# Patient Record
Sex: Female | Born: 1950 | Hispanic: Yes | Marital: Married | Smoking: Never smoker
Health system: Southern US, Community
[De-identification: ages and names within clinical notes are randomized; demographics above are authoritative.]

## PROBLEM LIST (undated history)

## (undated) DIAGNOSIS — T7840XA Allergy, unspecified, initial encounter: Secondary | ICD-10-CM

## (undated) DIAGNOSIS — M1990 Unspecified osteoarthritis, unspecified site: Secondary | ICD-10-CM

## (undated) DIAGNOSIS — R001 Bradycardia, unspecified: Secondary | ICD-10-CM

## (undated) DIAGNOSIS — F419 Anxiety disorder, unspecified: Secondary | ICD-10-CM

## (undated) DIAGNOSIS — E785 Hyperlipidemia, unspecified: Secondary | ICD-10-CM

## (undated) DIAGNOSIS — M8580 Other specified disorders of bone density and structure, unspecified site: Secondary | ICD-10-CM

## (undated) DIAGNOSIS — I10 Essential (primary) hypertension: Secondary | ICD-10-CM

## (undated) DIAGNOSIS — F32A Depression, unspecified: Secondary | ICD-10-CM

## (undated) DIAGNOSIS — E119 Type 2 diabetes mellitus without complications: Secondary | ICD-10-CM

## (undated) HISTORY — DX: Hyperlipidemia, unspecified: E78.5

## (undated) HISTORY — DX: Unspecified osteoarthritis, unspecified site: M19.90

## (undated) HISTORY — DX: Allergy, unspecified, initial encounter: T78.40XA

## (undated) HISTORY — DX: Other specified disorders of bone density and structure, unspecified site: M85.80

## (undated) HISTORY — DX: Type 2 diabetes mellitus without complications: E11.9

## (undated) HISTORY — DX: Bradycardia, unspecified: R00.1

## (undated) HISTORY — PX: CATARACT EXTRACTION: SUR2

## (undated) HISTORY — PX: ABDOMINAL HYSTERECTOMY: SHX81

## (undated) HISTORY — DX: Depression, unspecified: F32.A

## (undated) HISTORY — PX: CHOLECYSTECTOMY, LAPAROSCOPIC: SHX56

## (undated) HISTORY — DX: Essential (primary) hypertension: I10

## (undated) HISTORY — DX: Anxiety disorder, unspecified: F41.9

---

## 1986-03-27 HISTORY — PX: PARTIAL HYSTERECTOMY: SHX80

## 2008-03-27 HISTORY — PX: CHOLECYSTECTOMY, LAPAROSCOPIC: SHX56

## 2009-03-27 HISTORY — PX: KNEE ARTHROSCOPY W/ MENISCAL REPAIR: SHX1877

## 2009-03-27 HISTORY — PX: KNEE SURGERY: SHX244

## 2015-03-11 LAB — HM MAMMOGRAPHY

## 2016-02-10 ENCOUNTER — Telehealth: Payer: Self-pay | Admitting: *Deleted

## 2016-02-10 NOTE — Telephone Encounter (Signed)
Unable to reach patient at time of Pre-Visit Call. Called pt via interpreter line, no message left.

## 2016-02-11 ENCOUNTER — Ambulatory Visit (INDEPENDENT_AMBULATORY_CARE_PROVIDER_SITE_OTHER): Payer: Medicare Other | Admitting: Medical

## 2016-02-11 ENCOUNTER — Encounter: Payer: Self-pay | Admitting: Medical

## 2016-02-11 VITALS — BP 110/78 | HR 77 | Temp 97.8°F | Ht 60.5 in | Wt 168.2 lb

## 2016-02-11 DIAGNOSIS — M5441 Lumbago with sciatica, right side: Secondary | ICD-10-CM

## 2016-02-11 DIAGNOSIS — Z794 Long term (current) use of insulin: Secondary | ICD-10-CM

## 2016-02-11 DIAGNOSIS — Z8639 Personal history of other endocrine, nutritional and metabolic disease: Secondary | ICD-10-CM | POA: Diagnosis not present

## 2016-02-11 DIAGNOSIS — E118 Type 2 diabetes mellitus with unspecified complications: Secondary | ICD-10-CM

## 2016-02-11 DIAGNOSIS — G4700 Insomnia, unspecified: Secondary | ICD-10-CM

## 2016-02-11 DIAGNOSIS — G8929 Other chronic pain: Secondary | ICD-10-CM

## 2016-02-11 DIAGNOSIS — E785 Hyperlipidemia, unspecified: Secondary | ICD-10-CM | POA: Diagnosis not present

## 2016-02-11 LAB — COMPREHENSIVE METABOLIC PANEL
ALBUMIN: 4.5 g/dL (ref 3.5–5.2)
ALT: 18 U/L (ref 0–35)
AST: 17 U/L (ref 0–37)
Alkaline Phosphatase: 95 U/L (ref 39–117)
BILIRUBIN TOTAL: 0.3 mg/dL (ref 0.2–1.2)
BUN: 12 mg/dL (ref 6–23)
CALCIUM: 9.9 mg/dL (ref 8.4–10.5)
CO2: 27 mEq/L (ref 19–32)
CREATININE: 0.63 mg/dL (ref 0.40–1.20)
Chloride: 104 mEq/L (ref 96–112)
GFR: 100.64 mL/min (ref >60.00)
Glucose, Bld: 104 mg/dL — ABNORMAL HIGH (ref 70–99)
Potassium: 3.8 mEq/L (ref 3.5–5.1)
Sodium: 141 mEq/L (ref 135–145)
Total Protein: 7.9 g/dL (ref 6.0–8.3)

## 2016-02-11 LAB — CBC WITH DIFFERENTIAL/PLATELET
BASOS PCT: 0.4 % (ref 0.0–3.0)
Basophils Absolute: 0 10*3/uL (ref 0.0–0.1)
EOS PCT: 3.2 % (ref 0.0–5.0)
Eosinophils Absolute: 0.3 10*3/uL (ref 0.0–0.7)
HCT: 40.5 % (ref 36.0–46.0)
Hemoglobin: 13.7 g/dL (ref 12.0–15.0)
LYMPHS ABS: 3.1 10*3/uL (ref 0.7–4.0)
Lymphocytes Relative: 30.8 % (ref 12.0–46.0)
MCHC: 33.8 g/dL (ref 30.0–36.0)
MCV: 86.4 fl (ref 78.0–100.0)
MONO ABS: 0.5 10*3/uL (ref 0.1–1.0)
MONOS PCT: 4.6 % (ref 3.0–12.0)
NEUTROS ABS: 6.2 10*3/uL (ref 1.4–7.7)
Neutrophils Relative %: 61 % (ref 43.0–77.0)
Platelets: 350 10*3/uL (ref 150.0–400.0)
RBC: 4.68 Mil/uL (ref 3.87–5.11)
RDW: 13.3 % (ref 11.5–15.5)
WBC: 10.1 10*3/uL (ref 4.0–10.5)

## 2016-02-11 LAB — LIPID PANEL
CHOL/HDL RATIO: 3
CHOLESTEROL: 155 mg/dL (ref 0–200)
HDL: 49.5 mg/dL (ref >39.00)
LDL Cholesterol: 71 mg/dL (ref 0–99)
NonHDL: 105.92
Triglycerides: 174 mg/dL — ABNORMAL HIGH (ref 0.0–149.0)
VLDL: 34.8 mg/dL (ref 0.0–40.0)

## 2016-02-11 LAB — VITAMIN D 25 HYDROXY (VIT D DEFICIENCY, FRACTURES): VITD: 30.59 ng/mL (ref 30.00–100.00)

## 2016-02-11 LAB — HEMOGLOBIN A1C: Hgb A1c MFr Bld: 6.3 % (ref 4.6–6.5)

## 2016-02-11 MED ORDER — DICLOFENAC SODIUM 75 MG PO TBEC: 75.0000 mg | DELAYED_RELEASE_TABLET | Freq: Two times a day (BID) | ORAL | 0 refills | Status: DC

## 2016-02-11 MED ORDER — TRAZODONE HCL 50 MG PO TABS: 25.0000 mg | ORAL_TABLET | Freq: Every evening | ORAL | 0 refills | Status: DC | PRN

## 2016-02-11 NOTE — Progress Notes (Signed)
Subjective:    Patient ID: Janet Peters, female    DOB: 03/26/1951, 65 y.o.   MRN: [id number]  HPI  Pt in with months of low back pain. Pain moderate-severe  pain comes and goes. She has pain that radiates down her rt leg.Pt had imaging studies in past maybe one year. Pt new from [place]. Pt states her [place] in [place] had her on nabumetone. But it never worked for her pain. No numbness of legs, no weakness No incontience reported.  Pt states has been diabetes for for 3 years has been on meformin 500 mg bid. Pt states last a1-c 6.2 in August of this year. Pt can't exercise due to her back pain  Pt also has been on lipitor for 2 years.  Pt has some insomnia per pt all her life. She did not like hydroxyzine.    Review of Systems  Constitutional: Negative for chills, fatigue and fever.  HENT: Negative for congestion and drooling.   Respiratory: Negative for cough, chest tightness, shortness of breath and wheezing.   Cardiovascular: Negative for chest pain and palpitations.  Gastrointestinal: Negative for abdominal pain, blood in stool, constipation, diarrhea, rectal pain and vomiting.  Endocrine: Negative for polydipsia and polyphagia.  Genitourinary: Negative for dysuria, flank pain, frequency, genital sores and urgency.  Musculoskeletal: Positive for back pain. Negative for myalgias and neck pain.  Skin: Negative for rash.  Neurological: Negative for dizziness, syncope, speech difficulty, weakness and headaches.       Some radicular pain.  Psychiatric/Behavioral: Positive for sleep disturbance. Negative for behavioral problems, confusion and suicidal ideas.    Past Medical History:  Diagnosis Date  . Diabetes mellitus without complication ([place])   . Hyperlipidemia      Social History   Social History  . Marital status: Married    Spouse name: N/A  . Number of children: N/A  . Years of education: N/A   Occupational History  . Not on file.   Social History Main  Topics  . Smoking status: Never Smoker  . Smokeless tobacco: Never Used  . Alcohol use No  . Drug use: No  . Sexual activity: Yes   Other Topics Concern  . Not on file   Social History Narrative  . No narrative on file    Past Surgical History:  Procedure Laterality Date  . ABDOMINAL HYSTERECTOMY    . CESAREAN SECTION    . KNEE SURGERY Left 2011    History reviewed. No pertinent family history.  No Known Allergies  No current outpatient prescriptions on file prior to visit.   No current facility-administered medications on file prior to visit.     BP 110/78 (BP Location: Left Arm, Patient Position: Sitting)   Pulse 77   Temp 97.8 F (36.6 C) (Oral)   Ht 5' 0.5" (1.537 m) Comment: w/o shoes  Wt 168 lb 3.2 oz (76.3 kg)   BMI 32.31 kg/m       Objective:   Physical Exam  General Appearance- Not in acute distress.    Chest and Lung Exam Auscultation: Breath sounds:-Normal. Clear even and unlabored. Adventitious sounds:- No Adventitious sounds.  Cardiovascular Auscultation:Rythm - Regular, rate and rythm. Heart Sounds -Normal heart sounds.  Abdomen Inspection:-Inspection Normal.  Palpation/Perucssion: Palpation and Percussion of the abdomen reveal- Non Tender, No Rebound tenderness, No rigidity(Guarding) and No Palpable abdominal masses.  Liver:-Normal.  Spleen:- Normal.   Back Mid lumbar spine tenderness to palpation. Pain on straight leg lift. Pain  on lateral movements and flexion/extension of the spine.  Lower ext neurologic  L5-S1 sensation intact bilaterally. Normal patellar reflexes bilaterally. No foot drop bilaterally.  Feet- see quality metrics.        Assessment & Plan:  For your visit today will get cbc, cmp, lipid panel and a1-c.  Continue metformin and atorvastatin. May adjust treatment when labs are back.  Also get vitamin d level.  Stop nabumetone and rx diclofenac for pain.  For insomnia stop hydroxyzine and start  trazadone.  Follow up in 3-4 weeks. Please sign release form so we can get old records.

## 2016-02-11 NOTE — Progress Notes (Signed)
Pre visit review using our clinic review tool, if applicable. No additional management support is needed unless otherwise documented below in the visit note. 

## 2016-02-11 NOTE — Patient Instructions (Addendum)
For your visit today will get cbc, cmp, lipid panel and a1-c.  Continue metformin and atorvastatin. May adjust treatment when labs are back.  Also get vitamin d level.  Stop nabumetone and rx diclofenac for pain.  For insomnia stop hydroxyzine and start trazadone.  Follow up in 3-4 weeks. Please sign release form so we can get old records.

## 2016-02-13 ENCOUNTER — Telehealth: Payer: Self-pay | Admitting: Medical

## 2016-02-13 MED ORDER — VITAMIN D (ERGOCALCIFEROL) 1.25 MG (50000 UNIT) PO CAPS: 50000.0000 [IU] | ORAL_CAPSULE | ORAL | 0 refills | Status: DC

## 2016-02-13 NOTE — Telephone Encounter (Signed)
Sent vitamin D to pt pharmacy.

## 2016-02-29 ENCOUNTER — Telehealth: Payer: Self-pay | Admitting: Medical

## 2016-02-29 ENCOUNTER — Encounter: Payer: Self-pay | Admitting: Medical

## 2016-02-29 DIAGNOSIS — Z1231 Encounter for screening mammogram for malignant neoplasm of breast: Secondary | ICD-10-CM

## 2016-02-29 NOTE — Telephone Encounter (Signed)
Pt had mammogram ordered 2016. I don't see the report. I just got records of old mammograms. Done at [facility] [phone]. I  Have copy of mammogram reports. Maybe they never did study in 2016 since they did not have old report or films. Sometimes in past radiology would not do mammogram without prior study to compare? So will you get her scheduled now for mammogram. Coordinate referral with Jackelyn. I think she is spanish speaker.

## 2016-02-29 NOTE — Progress Notes (Signed)
[place]: No radiographically suspicious lesions.The patients next mammogram should be performed in 1 year, unless future clinical circumstances dictate otherwise.  [facility]

## 2016-03-09 ENCOUNTER — Ambulatory Visit (HOSPITAL_BASED_OUTPATIENT_CLINIC_OR_DEPARTMENT_OTHER)
Admission: RE | Admit: 2016-03-09 | Discharge: 2016-03-09 | Disposition: A | Discharge status: Home / Self Care | Payer: Medicare Other | Source: Ambulatory Visit | Attending: Medical | Admitting: Medical

## 2016-03-09 ENCOUNTER — Telehealth: Payer: Self-pay | Admitting: Medical

## 2016-03-09 ENCOUNTER — Telehealth: Payer: Self-pay | Admitting: Internal Medicine

## 2016-03-09 ENCOUNTER — Ambulatory Visit (INDEPENDENT_AMBULATORY_CARE_PROVIDER_SITE_OTHER): Payer: Medicare Other | Admitting: Medical

## 2016-03-09 ENCOUNTER — Encounter: Payer: Self-pay | Admitting: Medical

## 2016-03-09 VITALS — BP 110/68 | HR 110 | Temp 98.1°F | Ht 60.5 in | Wt 170.0 lb

## 2016-03-09 DIAGNOSIS — M5441 Lumbago with sciatica, right side: Secondary | ICD-10-CM | POA: Diagnosis not present

## 2016-03-09 DIAGNOSIS — R1011 Right upper quadrant pain: Secondary | ICD-10-CM

## 2016-03-09 DIAGNOSIS — E118 Type 2 diabetes mellitus with unspecified complications: Secondary | ICD-10-CM

## 2016-03-09 DIAGNOSIS — G8929 Other chronic pain: Secondary | ICD-10-CM

## 2016-03-09 DIAGNOSIS — Z1231 Encounter for screening mammogram for malignant neoplasm of breast: Secondary | ICD-10-CM | POA: Diagnosis present

## 2016-03-09 DIAGNOSIS — G4700 Insomnia, unspecified: Secondary | ICD-10-CM | POA: Diagnosis not present

## 2016-03-09 DIAGNOSIS — Z8639 Personal history of other endocrine, nutritional and metabolic disease: Secondary | ICD-10-CM

## 2016-03-09 DIAGNOSIS — Z794 Long term (current) use of insulin: Secondary | ICD-10-CM

## 2016-03-09 DIAGNOSIS — Z1211 Encounter for screening for malignant neoplasm of colon: Secondary | ICD-10-CM

## 2016-03-09 MED ORDER — ZOLPIDEM TARTRATE 5 MG PO TABS: 5.0000 mg | ORAL_TABLET | Freq: Every evening | ORAL | 1 refills | Status: DC | PRN

## 2016-03-09 MED ORDER — METFORMIN HCL 500 MG PO TABS: 500.0000 mg | ORAL_TABLET | Freq: Every day | ORAL | 1 refills | Status: DC

## 2016-03-09 NOTE — Telephone Encounter (Signed)
Would you help in trying to get her old records. I have not see her most recent mammogram or colonoscopy report. Will you coordinate and call those offices

## 2016-03-09 NOTE — Progress Notes (Signed)
Subjective:    Patient ID: Docie Hubley, female    DOB: 1951/02/04, 65 y.o.   MRN: [id number]  HPI  Pt in state on vitamin d 5000 units otc q day. Her vitamin d was normal but on lower end.   Pt had some lower back pain but with diclofenac for 10 days her back pain and associated symptoms stopped. Pt brings old mri report that states slight l4-l5 small herniated  Pt a1-c was 6.3. She needs refill of her metformin.  Pt lipid panel looked good only mild triglycerides elevated. Will continue atorvastatin 10 mg q day. Advised low cholesterol diet and exercise.   Pt states trazadone did not help her sleep. Neither did hydroxyzine. Pt states at most sleeps 3-4 usually at night for years. Pt has concern for meds that could be habit forming.(Pt is ok with trying very limited number of ambien per month)  End of exam noted years of very low and transient pain rt upper quadrant after eating but no pain presently. She is concerned for fatty liver and indicates she wants a ultrasound.     Review of Systems  Constitutional: Negative for chills and fatigue.  HENT: Negative for congestion, ear discharge and facial swelling.   Respiratory: Negative for cough, chest tightness, shortness of breath and wheezing.   Cardiovascular: Negative for chest pain and palpitations.  Gastrointestinal: Negative for abdominal pain and constipation.  Musculoskeletal: Negative for back pain and myalgias.  Skin: Negative for rash.  Neurological: Negative for dizziness, seizures, weakness and light-headedness.  Hematological: Negative for adenopathy. Does not bruise/bleed easily.  Psychiatric/Behavioral: Negative for agitation and decreased concentration.    Past Medical History:  Diagnosis Date  . Diabetes mellitus without complication ([place])   . Hyperlipidemia      Social History   Social History  . Marital status: Married    Spouse name: N/A  . Number of children: N/A  . Years of education: N/A    Occupational History  . Not on file.   Social History Main Topics  . Smoking status: Never Smoker  . Smokeless tobacco: Never Used  . Alcohol use No  . Drug use: No  . Sexual activity: Yes   Other Topics Concern  . Not on file   Social History Narrative  . No narrative on file    Past Surgical History:  Procedure Laterality Date  . ABDOMINAL HYSTERECTOMY    . CESAREAN SECTION    . KNEE SURGERY Left 2011    No family history on file.  No Known Allergies  Current Outpatient Prescriptions on File Prior to Visit  Medication Sig Dispense Refill  . CINNAMON PO Take by mouth.    . diclofenac (VOLTAREN) 75 MG EC tablet Take 1 tablet (75 mg total) by mouth 2 (two) times daily. 60 tablet 0  . metFORMIN (GLUCOPHAGE) 500 MG tablet Take by mouth daily with breakfast. Patient takes 1.5 tablets a day.    . traZODone (DESYREL) 50 MG tablet Take 0.5-1 tablets (25-50 mg total) by mouth at bedtime as needed for sleep. 30 tablet 0  . Vitamin D, Ergocalciferol, (DRISDOL) 50000 units CAPS capsule Take 1 capsule (50,000 Units total) by mouth every 7 (seven) days. 4 capsule 0   No current facility-administered medications on file prior to visit.     BP 110/68 (BP Location: Left Arm, Patient Position: Sitting, Cuff Size: Normal)   Pulse (!) 110   Temp 98.1 F (36.7 C) (Oral)   Ht 5'  0.5" (1.537 m)   Wt 170 lb (77.1 kg)   SpO2 97%   BMI 32.65 kg/m       Objective:   Physical Exam  General Mental Status- Alert. General Appearance- Not in acute distress.     Chest and Lung Exam Auscultation: Breath Sounds:-Normal.  Cardiovascular Auscultation:Rythm- Regular. Murmurs & Other Heart Sounds:Auscultation of the heart reveals- No Murmurs.  Abdomen Inspection:-Inspeection Normal. Palpation/Percussion:Note:No mass. Palpation and Percussion of the abdomen reveal- Non Tender, Non Distended + BS, no rebound or guarding.    Neurologic Cranial Nerve exam:- CN III-XII intact(No  nystagmus), symmetric smile. Strength:- 5/5 equal and symmetric strength both upper and lower extremities.      Assessment & Plan:  For history of  low vitamin D continue daily otc vit d. Will recheck level in about 6 month.  For diabetes, I want you to continue your metformin and will rx refill today.  For insomnia will rx Lorrin Mais but very limited amount. Only 10 per month. Can use every 3rd or 4th day to get good night sleep. Will see if this helps. If so will get do uds on follow up and get you to sign a contract.  For low back that is resolved can use diclofenac as needed.   Will order abdomen US to see if have fatty liver or Gallbladder disease.  Follow up in 1-2 month.

## 2016-03-09 NOTE — Telephone Encounter (Signed)
received Colon report and placed on Dr. Vena Rua desk for review. Dr. Hilarie Fredrickson is Doc of the Day.

## 2016-03-09 NOTE — Progress Notes (Signed)
Pre visit review using our clinic review tool, if applicable. No additional management support is needed unless otherwise documented below in the visit note. 

## 2016-03-09 NOTE — Patient Instructions (Addendum)
For history of  low vitamin D continue daily otc vit d. Will recheck level in about 6 month.  For diabetes, I want you to continue your metformin and will rx refill today.  For insomnia will rx Lorrin Mais but very limited amount. Only 10 per month. Can use every 3rd or 4th day to get good night sleep. Will see if this helps. If so will get do uds on follow up and get you to sign a contract.  For low back that is resolved can use diclofenac as needed.   Will order abdomen US to see if have fatty liver.   Follow up in 1-2 month or as needed

## 2016-03-10 ENCOUNTER — Ambulatory Visit (HOSPITAL_BASED_OUTPATIENT_CLINIC_OR_DEPARTMENT_OTHER)
Admission: RE | Admit: 2016-03-10 | Discharge: 2016-03-10 | Disposition: A | Discharge status: Home / Self Care | Payer: Medicare Other | Source: Ambulatory Visit | Attending: Medical | Admitting: Medical

## 2016-03-10 DIAGNOSIS — Z9049 Acquired absence of other specified parts of digestive tract: Secondary | ICD-10-CM | POA: Insufficient documentation

## 2016-03-10 DIAGNOSIS — R1011 Right upper quadrant pain: Secondary | ICD-10-CM | POA: Diagnosis present

## 2016-03-14 NOTE — Progress Notes (Signed)
Left VM for pt to call back to give results.

## 2016-03-21 DIAGNOSIS — E119 Type 2 diabetes mellitus without complications: Secondary | ICD-10-CM | POA: Insufficient documentation

## 2016-03-21 DIAGNOSIS — E782 Mixed hyperlipidemia: Secondary | ICD-10-CM | POA: Insufficient documentation

## 2016-03-30 NOTE — Telephone Encounter (Signed)
Spoke with patient states she does not have any family Hx of  colon cancer and is okay with recall colon being due 10/2019 per Dr.Pyrtle's recommendation.

## 2016-04-13 ENCOUNTER — Ambulatory Visit: Payer: Medicare Other | Admitting: Medical

## 2016-04-18 ENCOUNTER — Encounter: Payer: Self-pay | Admitting: Medical

## 2016-04-18 ENCOUNTER — Ambulatory Visit (INDEPENDENT_AMBULATORY_CARE_PROVIDER_SITE_OTHER): Payer: Medicare Other | Admitting: Medical

## 2016-04-18 VITALS — BP 135/70 | HR 102 | Temp 98.2°F | Resp 16 | Ht 60.5 in | Wt 169.0 lb

## 2016-04-18 DIAGNOSIS — M25511 Pain in right shoulder: Secondary | ICD-10-CM

## 2016-04-18 DIAGNOSIS — Z8639 Personal history of other endocrine, nutritional and metabolic disease: Secondary | ICD-10-CM | POA: Diagnosis not present

## 2016-04-18 DIAGNOSIS — Z794 Long term (current) use of insulin: Secondary | ICD-10-CM

## 2016-04-18 DIAGNOSIS — G4700 Insomnia, unspecified: Secondary | ICD-10-CM

## 2016-04-18 DIAGNOSIS — E118 Type 2 diabetes mellitus with unspecified complications: Secondary | ICD-10-CM | POA: Diagnosis not present

## 2016-04-18 MED ORDER — MIRTAZAPINE 15 MG PO TABS: 15.0000 mg | ORAL_TABLET | Freq: Every day | ORAL | 0 refills | Status: DC

## 2016-04-18 MED ORDER — VITAMIN D (ERGOCALCIFEROL) 1.25 MG (50000 UNIT) PO CAPS: 50000.0000 [IU] | ORAL_CAPSULE | ORAL | Status: DC

## 2016-04-18 NOTE — Progress Notes (Signed)
Pre visit review using our clinic review tool, if applicable. No additional management support is needed unless otherwise documented below in the visit note/SLS  

## 2016-04-18 NOTE — Patient Instructions (Addendum)
For low vitamin D will rx vitamin D 50,000 units. Stop otc vit d. Repeat vit d level in  months.  Continue metformin  And recheck a1c in 3 months.  For your insomnia will try remeron.   For your shoulder pain which appears resolved follow sport med advise and treatement.  For you varying bp levels check bp daily at home twice a day. Schedule nurse visit bp check in 2 weeks. Bring your machine day of bp check so we can verify good reading.   Follow up for regular check up in 3 months.

## 2016-04-18 NOTE — Progress Notes (Signed)
Subjective:    Patient ID: Janet Peters, female    DOB: 02-21-1951, 66 y.o.   MRN: [id number]  HPI   Pt in for follow up. Pt had no  fat on her liver which was her concern so I ordered US abdomen. Pt pain is only faint and mild at time. Really states no pain just discomfort. No epigastric pain. She thinks maybe associated with eating and not having gallbladder. She declines further work up presently.  Pt in states ambien did not help her sleep. Pt in past trazadone and use hydroxyzine and did not help. Husband denies any snoring in wife. Pt never used remeron past. Pt states she sleeps only 3 hours. She states even when younger was 66 years old slept like this.  Pt bp is good today. No ha an no cardiac signs or symptoms. Pt does have some variation during the day. Rare [id number] systolic but mostly controlled. She thinks stress increases by at times. She is worried about family living in [place].  Pt did go to sports medicine. Treated for bursstis of shoulder. Given steorid injection and meloxicam. Pt states pain is improved but she has concern that can reoccur.   Pt has borderline low vit d. on 5,000 units. Borderline despite daily use of this before recent level drawn.      Review of Systems  Constitutional: Negative for chills, fatigue and fever.  HENT: Negative for congestion and dental problem.   Respiratory: Negative for cough, chest tightness, shortness of breath and wheezing.   Cardiovascular: Negative for chest pain and palpitations.  Gastrointestinal: Negative for abdominal distention, abdominal pain, blood in stool, diarrhea, nausea and vomiting.       Faint ruq quadrant/rt lower rib discomfort mild.  Musculoskeletal:       Shoulder better.  Skin: Negative for rash.  Neurological: Negative for dizziness, speech difficulty, weakness, light-headedness, numbness and headaches.  Hematological: Negative for adenopathy. Does not bruise/bleed easily.    Psychiatric/Behavioral: Positive for sleep disturbance. Negative for behavioral problems, decreased concentration and suicidal ideas. The patient is not nervous/anxious.     Past Medical History:  Diagnosis Date  . Diabetes mellitus without complication ([place])   . Hyperlipidemia      Social History   Social History  . Marital status: Married    Spouse name: N/A  . Number of children: N/A  . Years of education: N/A   Occupational History  . Not on file.   Social History Main Topics  . Smoking status: Never Smoker  . Smokeless tobacco: Never Used  . Alcohol use No  . Drug use: No  . Sexual activity: Yes   Other Topics Concern  . Not on file   Social History Narrative  . No narrative on file    Past Surgical History:  Procedure Laterality Date  . ABDOMINAL HYSTERECTOMY    . CESAREAN SECTION    . KNEE SURGERY Left 2011    No family history on file.  No Known Allergies  Current Outpatient Prescriptions on File Prior to Visit  Medication Sig Dispense Refill  . CINNAMON PO Take by mouth.    . diclofenac (VOLTAREN) 75 MG EC tablet Take 1 tablet (75 mg total) by mouth 2 (two) times daily. 60 tablet 0  . metFORMIN (GLUCOPHAGE) 500 MG tablet Take 1 tablet (500 mg total) by mouth daily with breakfast. Patient takes 1.5 tablets a day. 135 tablet 1   No current facility-administered medications on file prior to visit.  BP 135/70 (BP Location: Left Arm, Patient Position: Sitting, Cuff Size: Large)   Pulse (!) 102   Temp 98.2 F (36.8 C) (Oral)   Resp 16   Ht 5' 0.5" (1.537 m)   Wt 169 lb (76.7 kg)   SpO2 100%   BMI 32.46 kg/m       Objective:   Physical Exam   General Mental Status- Alert. General Appearance- Not in acute distress.   Skin General: Color- Normal Color. Moisture- Normal Moisture.  Neck Carotid Arteries- Normal color. Moisture- Normal Moisture. No carotid bruits. No JVD.  Chest and Lung Exam Auscultation: Breath  Sounds:-Normal.  Cardiovascular Auscultation:Rythm- Regular. Murmurs & Other Heart Sounds:Auscultation of the heart reveals- No Murmurs.  Abdomen Inspection:-Inspeection Normal. Palpation/Percussion:Note:No mass. Palpation and Percussion of the abdomen reveal- Non Tender, Non Distended + BS, no rebound or guarding.   Neurologic Cranial Nerve exam:- CN III-XII intact(No nystagmus), symmetric smile. Strength:- 5/5 equal and symmetric strength both upper and lower extremities.     Assessment & Plan:  For low vitamin D will rx vitamin D 50,000 units. Stop otc vit d. Repeat vit d level in  months.  Continue metformin  And recheck a1c in 3 months.  For your insomnia will try remeron.   For your shoulder pain which appears resolved follow sport med advise and treatement.  For you varying bp levels check bp daily at home twice a day. Schedule nurse visit bp check in 2 weeks. Bring your machine day of bp check so we can verify good reading.   Follow up for regular check up in 3 months  Keishana Klinger, Percell Miller, [place]

## 2016-05-09 ENCOUNTER — Ambulatory Visit (INDEPENDENT_AMBULATORY_CARE_PROVIDER_SITE_OTHER): Payer: Medicare Other | Admitting: Medical

## 2016-05-09 ENCOUNTER — Telehealth: Payer: Self-pay | Admitting: Medical

## 2016-05-09 DIAGNOSIS — R030 Elevated blood-pressure reading, without diagnosis of hypertension: Secondary | ICD-10-CM | POA: Diagnosis not present

## 2016-05-09 MED ORDER — ATORVASTATIN CALCIUM 10 MG PO TABS: 10.0000 mg | ORAL_TABLET | Freq: Every day | ORAL | 3 refills | Status: DC

## 2016-05-09 NOTE — Telephone Encounter (Signed)
Caller name: Darryl Relation to pt: self  Call back number: [phone] Pharmacy: [facility], [address]  Reason for call: Pt came in office requesting needing prescription of Atorvastatin 10 mg, once a day. Pt states her last primary doctor gave her prescription the last time but now that she has changed primary care to our office pt only has 5 days of meds left. Please advise.

## 2016-05-09 NOTE — Telephone Encounter (Signed)
Pt has some hyperlipidemia. Liver enzymes not elevated. I wrote rx for low dose atorvastatin. Advise her will repeat her lipid panel and metabolic panel in 3 months fasting.

## 2016-05-09 NOTE — Telephone Encounter (Signed)
Janet Peters-- please advise below request. Medication was not previously reported but is showing in med reconciliation. Please advise?

## 2016-05-09 NOTE — Telephone Encounter (Signed)
Pt is primarily a spanish speaker. So send not to [place] to call pt. Thanks for your help.

## 2016-05-09 NOTE — Progress Notes (Signed)
Pre visit review using our clinic tool,if applicable. No additional management support is needed unless otherwise documented below in the visit note.    Patient in for BP check per verbal order given by Elise Benne 05/09/16.  BP today =138/82 P=118  Per E. Saguier,PA-C patient may have white coat syndrome when first coming in to office. Medication logs from home were within normal limits. No need to start medications for BP  this time.  I talked with RN and not bp medications given. Agree with above plan and assessement. I talked with pt by phone regarding her pulse. When she checks her bp her pulse is never over 100. She checks bp twice daily. She will continue to check pulse. If over 100 she will call Jackely/spanish speaking staff and notify her. Then message will be passed to me.  Saguier, Percell Miller, PA-C

## 2016-05-10 NOTE — Telephone Encounter (Signed)
Pt was informed the info below, and pt understood pt will pick up rx and already has an appt ready in July 18, 2016 with provider and will have labs done the same day.

## 2016-05-10 NOTE — Telephone Encounter (Signed)
LVM for pt to return call

## 2016-07-04 ENCOUNTER — Ambulatory Visit (INDEPENDENT_AMBULATORY_CARE_PROVIDER_SITE_OTHER): Payer: Medicare Other | Admitting: Medical

## 2016-07-04 VITALS — BP 130/70 | HR 98 | Wt 167.6 lb

## 2016-07-04 DIAGNOSIS — I4510 Unspecified right bundle-branch block: Secondary | ICD-10-CM

## 2016-07-04 DIAGNOSIS — E785 Hyperlipidemia, unspecified: Secondary | ICD-10-CM | POA: Diagnosis not present

## 2016-07-04 DIAGNOSIS — E118 Type 2 diabetes mellitus with unspecified complications: Secondary | ICD-10-CM

## 2016-07-04 DIAGNOSIS — F419 Anxiety disorder, unspecified: Secondary | ICD-10-CM

## 2016-07-04 DIAGNOSIS — Z794 Long term (current) use of insulin: Secondary | ICD-10-CM

## 2016-07-04 DIAGNOSIS — H9201 Otalgia, right ear: Secondary | ICD-10-CM

## 2016-07-04 DIAGNOSIS — E559 Vitamin D deficiency, unspecified: Secondary | ICD-10-CM | POA: Diagnosis not present

## 2016-07-04 DIAGNOSIS — R000 Tachycardia, unspecified: Secondary | ICD-10-CM

## 2016-07-04 DIAGNOSIS — R7989 Other specified abnormal findings of blood chemistry: Secondary | ICD-10-CM

## 2016-07-04 LAB — LIPID PANEL
CHOLESTEROL: 146 mg/dL (ref 0–200)
HDL: 46.4 mg/dL (ref >39.00)
LDL Cholesterol: 70 mg/dL (ref 0–99)
NonHDL: 99.14
TRIGLYCERIDES: 145 mg/dL (ref 0.0–149.0)
Total CHOL/HDL Ratio: 3
VLDL: 29 mg/dL (ref 0.0–40.0)

## 2016-07-04 LAB — COMPREHENSIVE METABOLIC PANEL
ALBUMIN: 4.5 g/dL (ref 3.5–5.2)
ALK PHOS: 96 U/L (ref 39–117)
ALT: 18 U/L (ref 0–35)
AST: 17 U/L (ref 0–37)
BILIRUBIN TOTAL: 0.4 mg/dL (ref 0.2–1.2)
BUN: 16 mg/dL (ref 6–23)
CALCIUM: 9.9 mg/dL (ref 8.4–10.5)
CO2: 27 mEq/L (ref 19–32)
CREATININE: 0.66 mg/dL (ref 0.40–1.20)
Chloride: 105 mEq/L (ref 96–112)
GFR: 95.26 mL/min (ref >60.00)
Glucose, Bld: 122 mg/dL — ABNORMAL HIGH (ref 70–99)
Potassium: 4.1 mEq/L (ref 3.5–5.1)
Sodium: 140 mEq/L (ref 135–145)
TOTAL PROTEIN: 8.1 g/dL (ref 6.0–8.3)

## 2016-07-04 LAB — HEMOGLOBIN A1C: Hgb A1c MFr Bld: 6.3 % (ref 4.6–6.5)

## 2016-07-04 LAB — TROPONIN I: TNIDX: 0 ug/L (ref 0.00–0.06)

## 2016-07-04 MED ORDER — CLONAZEPAM 0.5 MG PO TABS: 0.5000 mg | ORAL_TABLET | Freq: Two times a day (BID) | ORAL | 0 refills | Status: DC | PRN

## 2016-07-04 NOTE — Progress Notes (Signed)
Subjective:    Patient ID: Janet Peters, female    DOB: 22-Apr-1950, 66 y.o.   MRN: [id number]  HPI  Pt in for follow up.   Pt in with some left ear discomfort. Feels like water in ear. No uri or allergy signs or symptoms recently. Has faint ear discomfort/water sensation on and off for 2 weeks.  Pt also states at times she feels some anxiety at times. Happens at various times. No note of any rapid heart rate. Pt continue to worry about situation with her home country [place].(as trip approaches she feels more anxious). She in past described concern about her family that still lives there.   Pt had rare on event of high pulse on bp check her. At that time felt no symptoms  She has no recurrence. She is currently asymptomatic at this time and she wants ekg. No prior ekg done in our office. Last one she states was done in [place] years ago. No abnormality noted.   Pt pulse when she check is 85-90. When she checked her bp 120/70.  She checks bp and pulse every 3 days. Pt thinks med she took for her shoulder caused her bp to increase. She notes when had used meloxicam her bp went up. No longer using.     Review of Systems  Constitutional: Negative for chills, fatigue and fever.  HENT: Negative for congestion, ear discharge, ear pain, facial swelling, postnasal drip, rhinorrhea, sinus pain, sinus pressure and sneezing.   Respiratory: Negative for cough, chest tightness, shortness of breath and wheezing.   Cardiovascular: Negative for chest pain and palpitations.  Gastrointestinal: Negative for abdominal pain, diarrhea and nausea.  Musculoskeletal: Negative for back pain and neck pain.  Skin: Negative for rash.  Neurological: Negative for dizziness, weakness, light-headedness and headaches.  Hematological: Negative for adenopathy. Does not bruise/bleed easily.  Psychiatric/Behavioral: Negative for agitation, behavioral problems, confusion, self-injury, sleep disturbance and  suicidal ideas. The patient is nervous/anxious.     Past Medical History:  Diagnosis Date  . Diabetes mellitus without complication ([place])   . Hyperlipidemia      Social History   Social History  . Marital status: Married    Spouse name: N/A  . Number of children: N/A  . Years of education: N/A   Occupational History  . Not on file.   Social History Main Topics  . Smoking status: Never Smoker  . Smokeless tobacco: Never Used  . Alcohol use No  . Drug use: No  . Sexual activity: Yes   Other Topics Concern  . Not on file   Social History Narrative  . No narrative on file    Past Surgical History:  Procedure Laterality Date  . ABDOMINAL HYSTERECTOMY    . CESAREAN SECTION    . KNEE SURGERY Left 2011    No family history on file.  No Known Allergies  Current Outpatient Prescriptions on File Prior to Visit  Medication Sig Dispense Refill  . atorvastatin (LIPITOR) 10 MG tablet Take 1 tablet (10 mg total) by mouth daily. 30 tablet 3  . Cholecalciferol (VITAMIN D3) 5000 units CAPS Take by mouth.    Marland Kitchen CINNAMON PO Take by mouth.    . diclofenac (VOLTAREN) 75 MG EC tablet Take 1 tablet (75 mg total) by mouth 2 (two) times daily. 60 tablet 0  . meloxicam (MOBIC) 15 MG tablet Take 15 mg by mouth daily.    . metFORMIN (GLUCOPHAGE) 500 MG tablet Take 1 tablet (  500 mg total) by mouth daily with breakfast. Patient takes 1.5 tablets a day. 135 tablet 1  . mirtazapine (REMERON) 15 MG tablet Take 1 tablet (15 mg total) by mouth at bedtime. 30 tablet 0  . Vitamin D, Ergocalciferol, (DRISDOL) 50000 units CAPS capsule Take 1 capsule (50,000 Units total) by mouth every 7 (seven) days. 8 capsule 0-   No current facility-administered medications on file prior to visit.     BP 130/70   Pulse 98   Wt 167 lb 9.6 oz (76 kg)   SpO2 97%   BMI 32.19 kg/m       Objective:   Physical Exam  General  Mental Status - Alert. General Appearance - Well groomed. Not in acute  distress.  Skin Rashes- No Rashes.  HEENT Head- Normal. Ear Auditory Canal - Left- Normal. Right - Normal.Tympanic Membrane- Left- Normal. Right- Normal. Eye Sclera/Conjunctiva- Left- Normal. Right- Normal. Nose & Sinuses Nasal Mucosa- Left-  Boggy and Congested. Right-  Boggy and  Congested.Bilateral  No maxillary and no  frontal sinus pressure. Mouth & Throat Lips: Upper Lip- Normal: no dryness, cracking, pallor, cyanosis, or vesicular eruption. Lower Lip-Normal: no dryness, cracking, pallor, cyanosis or vesicular eruption. Buccal Mucosa- Bilateral- No Aphthous ulcers. Oropharynx- No Discharge or Erythema. Tonsils: Characteristics- Bilateral- No Erythema or Congestion. Size/Enlargement- Bilateral- No enlargement. Discharge- bilateral-None.  Neck Neck- Supple. No Masses.   Chest and Lung Exam Auscultation: Breath Sounds:-Clear even and unlabored.  Cardiovascular Auscultation:Rythm- Regular, rate and rhythm. Murmurs & Other Heart Sounds:Ausculatation of the heart reveal- No Murmurs.  Lymphatic Head & Neck General Head & Neck Lymphatics: Bilateral: Description- No Localized lymphadenopathy.  Lower ext- no pedal edema. Negative homans sign.        Assessment & Plan:  For your anxiety and upcoming trip will make low klonopin available.(she was instructed on how to use. She will use sparingly)  For rt ear discomfort I think this may be ear pressure related to low level allergic rhinitis. Rx flonase.  For hx of mild tachycardia but rt bundle branch on ekg will refer you to cardiologist to work up potential causes and evaluate further.  Will get labs today lipid panel, a1c and cmp.  Call jackelyn by this Friday if no call from cardiologist office.  Follow up in 10 days or as needed  Total of 40 minutes spent with pt. 50% of time spent counseling pt on various issues. Most of counseling regarding her ekg today and my desire to refer to cardiologist. But also did counsel  on anxiety and her potential cause of ear discomfort.  Robbye Dede, Percell Miller, PA-C

## 2016-07-04 NOTE — Patient Instructions (Addendum)
For your anxiety and upcoming trip will make low klonopin available. (she was instructed on how to use. She will use sparingly)  For rt ear discomfort I think this may be ear pressure related to low level allergic rhinitis. Rx flonase.  For hx of mild tachycardia but rt bundle branch on ekg will refer you to cardiologist to work up potential causes and evaluate further.  Will get labs today lipid panel, a1c and cmp.  Call jackelyn by this Friday if no call from cardiologist office.  Follow up in 10 days or as needed

## 2016-07-05 ENCOUNTER — Telehealth: Payer: Self-pay | Admitting: Medical

## 2016-07-05 NOTE — Telephone Encounter (Signed)
Looks like she was notified of lab results by [place].

## 2016-07-05 NOTE — Telephone Encounter (Signed)
Relation to YE:MVVK Call back [phone]   Reason for call:  Patient inquiring about lab result

## 2016-07-05 NOTE — Telephone Encounter (Signed)
Pt was informed the below and understood. °

## 2016-07-05 NOTE — Telephone Encounter (Signed)
-----   Message from Mackie Pai, PA-C sent at 07/04/2016  3:49 PM EDT ----- Pt sugar level is about the exact same.(a1-c 6.3 last time and again 6.3) Kidney function still looks good. Lipid panel now looks all good. Triglycerides/fat in blood now in normal limits. No changes to current meds/treatment.

## 2016-07-08 LAB — VITAMIN D 1,25 DIHYDROXY
Vitamin D 1, 25 (OH)2 Total: 34 pg/mL (ref 18–72)
Vitamin D2 1, 25 (OH)2: 13 pg/mL
Vitamin D3 1, 25 (OH)2: 21 pg/mL

## 2016-07-10 NOTE — Telephone Encounter (Signed)
Pt was also informed about her results for Vitamin D as mentioned on staff message.

## 2016-07-17 ENCOUNTER — Encounter: Payer: Self-pay | Admitting: Cardiology

## 2016-07-17 ENCOUNTER — Ambulatory Visit (INDEPENDENT_AMBULATORY_CARE_PROVIDER_SITE_OTHER): Payer: Medicare Other | Admitting: Cardiology

## 2016-07-17 ENCOUNTER — Ambulatory Visit: Payer: Medicare Other | Admitting: Cardiology

## 2016-07-17 VITALS — BP 126/80 | HR 105 | Ht 62.0 in | Wt 170.2 lb

## 2016-07-17 DIAGNOSIS — I4510 Unspecified right bundle-branch block: Secondary | ICD-10-CM | POA: Diagnosis not present

## 2016-07-17 NOTE — Progress Notes (Signed)
Electrophysiology Office Note   Date:  07/17/2016   ID:  Minda Faas, Alferd Apa [birth year], MRN [id number]  PCP:  Elise Benne Primary Electrophysiologist:  Starlene Consuegra Meredith Leeds, MD    Chief Complaint  Patient presents with  . New Patient (Initial Visit)    RBBB     History of Present Illness: Janet Peters is a 66 y.o. female who is being seen today for the evaluation of RBBB at the request of Saguier, Percell Miller, [place]. Presenting today for electrophysiology evaluation. Has a history of both diabetes and hypertension. He noted that her pulse was high on her blood pressure checked. She had no symptoms at that time. She has not had a recurrence. Her EKG showed a right bundle branch block. There is no EKG to compare.    Today, she denies symptoms of palpitations, chest pain, shortness of breath, orthopnea, PND, lower extremity edema, claudication, dizziness, presyncope, syncope, bleeding, or neurologic sequela. The patient is tolerating medications without difficulties.    Past Medical History:  Diagnosis Date  . Diabetes mellitus without complication ([place])   . Hyperlipidemia    Past Surgical History:  Procedure Laterality Date  . ABDOMINAL HYSTERECTOMY    . CESAREAN SECTION    . KNEE SURGERY Left 2011     Current Outpatient Prescriptions  Medication Sig Dispense Refill  . atorvastatin (LIPITOR) 10 MG tablet Take 1 tablet (10 mg total) by mouth daily. 30 tablet 3  . Cholecalciferol (VITAMIN D3) 5000 units CAPS Take by mouth.    Marland Kitchen CINNAMON PO Take by mouth.    . Magnesium 400 MG CAPS Take 1 capsule by mouth daily.    . metFORMIN (GLUCOPHAGE) 500 MG tablet Take 1 tablet (500 mg total) by mouth daily with breakfast. Patient takes 1.5 tablets a day. 135 tablet 1   No current facility-administered medications for this visit.     Allergies:   Patient has no known allergies.   Social History:  The patient  reports that she has never smoked. She has never  used smokeless tobacco. She reports that she does not drink alcohol or use drugs.   Family History:  The patient's  family history includes Heart attack in her maternal grandmother.    ROS:  Please see the history of present illness.   Otherwise, review of systems is positive for weight loss, back pain, anxiety.   All other systems are reviewed and negative.    PHYSICAL EXAM: VS:  BP 126/80   Pulse (!) 105   Ht 5\' 2"  (1.575 m)   Wt 170 lb 3.2 oz (77.2 kg)   BMI 31.13 kg/m  , BMI Body mass index is 31.13 kg/m. GEN: Well nourished, well developed, in no acute distress  HEENT: normal  Neck: no JVD, carotid bruits, or masses Cardiac: RRR; no murmurs, rubs, or gallops,no edema  Respiratory:  clear to auscultation bilaterally, normal work of breathing GI: soft, nontender, nondistended, + BS MS: no deformity or atrophy  Skin: warm and dry Neuro:  Strength and sensation are intact Psych: euthymic mood, full affect  EKG:  EKG is ordered today. Personal review of the ekg ordered 07/04/16 shows sinus rhythm, rate 105, right bundle branch block  Recent Labs: 02/11/2016: Hemoglobin 13.7; Platelets 350.0 07/04/2016: ALT 18; BUN 16; Creatinine, Ser 0.66; Potassium 4.1; Sodium 140    Lipid Panel     Component Value Date/Time   CHOL 146 07/04/2016 0914   TRIG 145.0 07/04/2016 0914   HDL 46.40  07/04/2016 0914   CHOLHDL 3 07/04/2016 0914   VLDL 29.0 07/04/2016 0914   LDLCALC 70 07/04/2016 0914     Wt Readings from Last 3 Encounters:  07/17/16 170 lb 3.2 oz (77.2 kg)  07/04/16 167 lb 9.6 oz (76 kg)  04/18/16 169 lb (76.7 kg)      Other studies Reviewed: Additional studies/ records that were reviewed today include: Epic notes - personally reviewed Review of the above records today demonstrates: RBBB   ASSESSMENT AND PLAN:  1.  New right bundle branch block: Newly found her most recent EKG performed on 07/04/16. She does not have any other EKGs in the system. Currently she has no  symptoms of chest pain or shortness of breath. To further evaluate, we'll order an echocardiogram. If her echo is normal, Valyncia Wiens plan to see her back on an as-needed basis.  2. Hypertension: Well-controlled today. No medication changes.   Current medicines are reviewed at length with the patient today.   The patient does not have concerns regarding her medicines.  The following changes were made today:  none  Labs/ tests ordered today include:  Orders Placed This Encounter  Procedures  . EKG 12-Lead  . ECHOCARDIOGRAM COMPLETE     Disposition:   FU with Lunah Losasso PRN months  Signed, Zethan Alfieri Meredith Leeds, MD  07/17/2016 2:19 PM     [facility] [address] [place] [address] [phone] (office) [phone] (fax)

## 2016-07-17 NOTE — Patient Instructions (Signed)
Medication Instructions:    Your physician recommends that you continue on your current medications as directed. Please refer to the Current Medication list given to you today.  Labwork:  None ordered  Testing/Procedures: Your physician has requested that you have an echocardiogram. Echocardiography is a painless test that uses sound waves to create images of your heart. It provides your doctor with information about the size and shape of your heart and how well your heart's chambers and valves are working. This procedure takes approximately one hour. There are no restrictions for this procedure.  Follow-Up:  Follow up to be determined based upon echocardiogram results.  Thank you for choosing [facility]!!   Trinidad Curet, RN [phone]  Any Other Special Instructions Will Be Listed Below (If Applicable).  Echocardiogram An echocardiogram, or echocardiography, uses sound waves (ultrasound) to produce an image of your heart. The echocardiogram is simple, painless, obtained within a short period of time, and offers valuable information to your health care provider. The images from an echocardiogram can provide information such as:  Evidence of coronary artery disease (CAD).  Heart size.  Heart muscle function.  Heart valve function.  Aneurysm detection.  Evidence of a past heart attack.  Fluid buildup around the heart.  Heart muscle thickening.  Assess heart valve function. Tell a health care provider about:  Any allergies you have.  All medicines you are taking, including vitamins, herbs, eye drops, creams, and over-the-counter medicines.  Any problems you or family members have had with anesthetic medicines.  Any blood disorders you have.  Any surgeries you have had.  Any medical conditions you have.  Whether you are pregnant or may be pregnant. What happens before the procedure? No special preparation is needed. Eat and drink normally. What happens  during the procedure?  In order to produce an image of your heart, gel will be applied to your chest and a wand-like tool (transducer) will be moved over your chest. The gel will help transmit the sound waves from the transducer. The sound waves will harmlessly bounce off your heart to allow the heart images to be captured in real-time motion. These images will then be recorded.  You may need an IV to receive a medicine that improves the quality of the pictures. What happens after the procedure? You may return to your normal schedule including diet, activities, and medicines, unless your health care provider tells you otherwise. This information is not intended to replace advice given to you by your health care provider. Make sure you discuss any questions you have with your health care provider. Document Released: 03/10/2000 Document Revised: 10/30/2015 Document Reviewed: 11/18/2012 Elsevier Interactive Patient Education  2017 [employer].

## 2016-07-18 ENCOUNTER — Ambulatory Visit: Payer: Medicare Other | Admitting: Medical

## 2016-08-28 ENCOUNTER — Other Ambulatory Visit: Payer: Self-pay

## 2016-08-28 ENCOUNTER — Ambulatory Visit (INDEPENDENT_AMBULATORY_CARE_PROVIDER_SITE_OTHER): Payer: Medicare Other | Admitting: Internal Medicine

## 2016-08-28 ENCOUNTER — Ambulatory Visit (HOSPITAL_COMMUNITY): Payer: Medicare Other | Attending: Cardiovascular Disease

## 2016-08-28 ENCOUNTER — Encounter: Payer: Self-pay | Admitting: Internal Medicine

## 2016-08-28 VITALS — BP 128/84 | HR 114 | Temp 98.1°F | Resp 14 | Ht 62.0 in | Wt 169.2 lb

## 2016-08-28 DIAGNOSIS — R030 Elevated blood-pressure reading, without diagnosis of hypertension: Secondary | ICD-10-CM | POA: Diagnosis not present

## 2016-08-28 DIAGNOSIS — I061 Rheumatic aortic insufficiency: Secondary | ICD-10-CM | POA: Diagnosis not present

## 2016-08-28 DIAGNOSIS — I5030 Unspecified diastolic (congestive) heart failure: Secondary | ICD-10-CM | POA: Diagnosis not present

## 2016-08-28 DIAGNOSIS — R000 Tachycardia, unspecified: Secondary | ICD-10-CM | POA: Diagnosis not present

## 2016-08-28 DIAGNOSIS — I4510 Unspecified right bundle-branch block: Secondary | ICD-10-CM | POA: Diagnosis not present

## 2016-08-28 NOTE — Progress Notes (Signed)
Pre visit review using our clinic review tool, if applicable. No additional management support is needed unless otherwise documented below in the visit note. 

## 2016-08-28 NOTE — Progress Notes (Signed)
Subjective:    Patient ID: Janet Peters, female    DOB: December 17, 1950, 66 y.o.   MRN: [id number]  DOS:  08/28/2016 Type of visit - description : acute, here w/ jer husband Interval history: Here concerned about her blood pressure. She flew back from [place] about 5 days ago , 4 days ago started to check her BPs at home and they were higher than usual ranging from 124  To 150s; Diastolic BP usually 35H. She feels well.   Review of Systems n No, dizziness, nosebleed. No chest pain or difficulty breathing. No palpitations. Denies taking ibuprofen or similar NSAIDs, not eating more salt than usual. She had bilateral ankle edema while was visiting [place] but that resolved completely when she arrived here. No actual calf pain-swelling   Past Medical History:  Diagnosis Date  . Diabetes mellitus without complication ([place])   . Hyperlipidemia     Past Surgical History:  Procedure Laterality Date  . ABDOMINAL HYSTERECTOMY    . CESAREAN SECTION    . KNEE SURGERY Left 2011    Social History   Social History  . Marital status: Married    Spouse name: N/A  . Number of children: N/A  . Years of education: N/A   Occupational History  . Not on file.   Social History Main Topics  . Smoking status: Never Smoker  . Smokeless tobacco: Never Used  . Alcohol use No  . Drug use: No  . Sexual activity: Yes   Other Topics Concern  . Not on file   Social History Narrative  . No narrative on file      Allergies as of 08/28/2016   No Known Allergies     Medication List       Accurate as of 08/28/16  8:06 PM. Always use your most recent med list.          atorvastatin 10 MG tablet Commonly known as:  LIPITOR Take 1 tablet (10 mg total) by mouth daily.   CINNAMON PO Take by mouth.   Magnesium 400 MG Caps Take 1 capsule by mouth daily.   metFORMIN 500 MG tablet Commonly known as:  GLUCOPHAGE Take 1 tablet (500 mg total) by mouth daily with breakfast. Patient  takes 1.5 tablets a day.   Vitamin D3 5000 units Caps Take by mouth.          Objective:   Physical Exam BP 128/84 (BP Location: Left Arm, Patient Position: Sitting, Cuff Size: Normal)   Pulse (!) 114   Temp 98.1 F (36.7 C) (Oral)   Resp 14   Ht 5\' 2"  (1.575 m)   Wt 169 lb 4 oz (76.8 kg)   SpO2 98%   BMI 30.96 kg/m  General:   Well developed, well nourished . NAD.  HEENT:  Normocephalic . Face symmetric, atraumatic Lungs:  CTA B Normal respiratory effort, no intercostal retractions, no accessory muscle use. Heart: Slightly tachycardic,  no murmur.  No pretibial or peri-ankle edema bilaterally. Calves symmetric, no TTP.  Skin: Not pale. Not jaundice Neurologic:  alert & oriented X3.  Speech normal, gait appropriate for age and unassisted Psych--  Cognition and judgment appear intact.  Cooperative with normal attention span and concentration.  Behavior appropriate. No anxious or depressed appearing.      Assessment & Plan:   66 y/o female, h/o DM-high cholesterolPresents with the following: Elevated BP: BP has been on and off elevated in the last 4 days, recent labs normal. Recommend  low salt diet, exercise regularly, check BPs at home. She actually brought her cuff, it is somewhat small, it read ~ 20 points higher than our cuff. Recommend to get a new, larger  one. Tachycardia: On chart review, this is not a new issue, her heart rate is usually high. She just came from [place], she denies chest pain, palpitations, calf pain/swelling. My suspicion for the tachycardia to be a sign of PE is very low. Recent CBC normal, will check a TSH.

## 2016-08-28 NOTE — Patient Instructions (Signed)
GO TO THE LAB : Get the blood work     Low-salt diet  Check the  blood pressure 2 or 3 times a   week   Be sure your blood pressure is between 110/65 and  135/85. If it is consistently higher or lower, let me know  Check your blood pressure after resting for 15 minutes, use  cuff that fits loosely

## 2016-08-29 LAB — TSH: TSH: 3.95 u[IU]/mL (ref 0.35–4.50)

## 2016-09-11 ENCOUNTER — Other Ambulatory Visit: Payer: Self-pay | Admitting: Medical

## 2016-09-12 NOTE — Telephone Encounter (Signed)
Caller name:Moreno,Jorge Relation to pt: spouse  Call back [place], [place] [address] [phone] (Phone) [phone] (Fax)     Reason for call:  Patient checking on the status metFORMIN (GLUCOPHAGE) 500 MG tablet, atorvastatin (LIPITOR) 10 MG tablet. Patient requesting blood glucose monitoring kit, please advise

## 2016-10-03 ENCOUNTER — Encounter: Payer: Self-pay | Admitting: Obstetrics & Gynecology

## 2016-10-03 ENCOUNTER — Ambulatory Visit (INDEPENDENT_AMBULATORY_CARE_PROVIDER_SITE_OTHER): Payer: Medicare Other | Admitting: Obstetrics & Gynecology

## 2016-10-03 VITALS — BP 130/82 | Ht 62.0 in | Wt 169.0 lb

## 2016-10-03 DIAGNOSIS — Z780 Asymptomatic menopausal state: Secondary | ICD-10-CM

## 2016-10-03 DIAGNOSIS — Z01411 Encounter for gynecological examination (general) (routine) with abnormal findings: Secondary | ICD-10-CM | POA: Diagnosis not present

## 2016-10-03 DIAGNOSIS — E669 Obesity, unspecified: Secondary | ICD-10-CM

## 2016-10-03 NOTE — Progress Notes (Signed)
Janet Peters [birth year] [id number]   History:    66 y.o. G2P2 Married.  From [place].  Visit conducted in [place], but patient understands [place].  Daughter in [place].  RP:  New patient presenting for annual gyn exam   HPI:  S/P Hysterectomy 1988.  No pelvic pain, but Rt lower back pain radiating to Rt pelvic area and Rt leg occasionally when sitting, known Rt Sciatic pain with bulging disc per patient.  Sexually active, no dyspareunia.  Breasts wnl.  Mictions/BMs wnl.  BMI 30.  Fitness stationary bicycle/Low carb diet.  Past medical history,surgical history, family history and social history were all reviewed and documented in the EPIC chart.  Gynecologic History No LMP recorded. Patient has had a hysterectomy. Contraception: status post hysterectomy Last Pap: 09/2015. Results were: normal Last mammogram: 02/2016. Results were: normal  Obstetric History OB History  Gravida Para Term Preterm AB Living  2 2       2   SAB TAB Ectopic Multiple Live Births               # Outcome Date GA Lbr Len/2nd Weight Sex Delivery Anes PTL Lv  2 Para           1 Para                ROS: A ROS was performed and pertinent positives and negatives are included in the history.  GENERAL: No fevers or chills. HEENT: No change in vision, no earache, sore throat or sinus congestion. NECK: No pain or stiffness. CARDIOVASCULAR: No chest pain or pressure. No palpitations. PULMONARY: No shortness of breath, cough or wheeze. GASTROINTESTINAL: No abdominal pain, nausea, vomiting or diarrhea, melena or bright red blood per rectum. GENITOURINARY: No urinary frequency, urgency, hesitancy or dysuria. MUSCULOSKELETAL: No joint or muscle pain, no back pain, no recent trauma. DERMATOLOGIC: No rash, no itching, no lesions. ENDOCRINE: No polyuria, polydipsia, no heat or cold intolerance. No recent change in weight. HEMATOLOGICAL: No anemia or easy bruising or bleeding. NEUROLOGIC: No headache, seizures,  numbness, tingling or weakness. PSYCHIATRIC: No depression, no loss of interest in normal activity or change in sleep pattern.     Exam:   BP 130/82   Ht 5\' 2"  (1.575 m)   Wt 76.7 kg (169 lb)   BMI 30.91 kg/m   Body mass index is 30.91 kg/m.  General appearance : Well developed well nourished female. No acute distress HEENT: Eyes: no retinal hemorrhage or exudates,  Neck supple, trachea midline, no carotid bruits, no thyroidmegaly Lungs: Clear to auscultation, no rhonchi or wheezes, or rib retractions  Heart: Regular rate and rhythm, no murmurs or gallops Breast:Examined in sitting and supine position were symmetrical in appearance, no palpable masses or tenderness,  no skin retraction, no nipple inversion, no nipple discharge, no skin discoloration, no axillary or supraclavicular lymphadenopathy Abdomen: no palpable masses or tenderness, no rebound or guarding Extremities: no edema or skin discoloration or tenderness  Pelvic:  Bartholin, Urethra, Skene Glands: Within normal limits             Vagina: No gross lesions or discharge  Cervix/Uterus: Absent  Adnexa  Without masses or tenderness  Anus and perineum  normal    Assessment/Plan:  66 y.o. female for annual exam   1. Encounter for gynecological examination with abnormal finding Gyn exam s/p Hysterectomy.  Mild Atrophic Vaginitis.  Pap normal 09/2015.  Breasts wnl.  Screening mammo neg 02/2016.  2. Menopause present No HRT.  ASxic.  Vit D supplement, Ca++ in food, Weight bearing physical activity. - DG BONE DENSITY (DXA); Future  3. Obesity BMI 30 Low Carb diet, [employer] recommended.  Continue with fitness.  Counseling on above issues >50% x 20 minutes.  Princess Bruins MD, 8:52 AM 10/03/2016

## 2016-10-03 NOTE — Patient Instructions (Signed)
1. Encounter for gynecological examination with abnormal finding Gyn exam s/p Hysterectomy.  Mild Atrophic Vaginitis.  Pap normal 09/2015.  Breasts wnl.  Screening mammo neg 02/2016.  2. Menopause present No HRT.  ASxic.  Vit D supplement, Ca++ in food, Weight bearing physical activity. - DG BONE DENSITY (DXA); Future  3. Obesity BMI 30 Low Carb diet, [employer] recommended.  Continue with fitness.  Fue un placer de encontrarla hoy!    [place] (Health Maintenance, Female) Un estilo de vida saludable y los cuidados preventivos pueden favorecer considerablemente a la salud y Musician. Pregunte a su mdico cul es el cronograma de exmenes peridicos apropiado para usted. Esta es una buena oportunidad para consultarlo sobre cmo prevenir enfermedades y [place] sano. Adems de los controles, hay muchas otras cosas que puede hacer usted mismo. Los expertos han realizado numerosas investigaciones [employer] cambios en el estilo de vida y las medidas de prevencin que, [place], lo ayudarn a mantenerse sano. Solicite a su mdico ms informacin. EL PESO Y LA DIETA Consuma una dieta saludable.  Asegrese de [employer] verduras, frutas, productos lcteos de bajo contenido de [place] y Advertising account planner.  No consuma muchos alimentos de alto contenido de grasas slidas, azcares agregados o sal.  Realice actividad fsica con regularidad. Esta es una de las prcticas ms importantes que puede hacer por su salud. ? La Delorise Shiner de los adultos deben hacer ejercicio durante al menos 17mnutos por semana. El ejercicio debe aumentar la frecuencia cardaca y pActorla transpiracin (ejercicio de iMilner. ? La mayora de los adultos tambin deben hacer ejercicios de elongacin al mToysRusveces a la semana. Agregue esto al su plan de ejercicio de intensidad moderada. Mantenga un peso saludable.  El ndice de masa corporal ([facility] es una  medida que puede utilizarse para identificar posibles problemas de pSaybrook-on-the-Lake Proporciona una estimacin de la grasa corporal basndose en el peso y la altura. Su mdico puede ayudarle a dRadiation protection practitionerIWailua Homesteadsy a lScientist, forensico mTheatre managerun peso saludable.  Para las mujeres de 20aos o ms: ? Un IPam Specialty Hospital Of Hammondmenor de 18,5 se considera bajo peso. ? Un ILutheran Campus Ascentre 18,5 y 24,9 es normal. ? Un IBunkie General Hospitalentre 25 y 29,9 se considera sobrepeso. ? Un IMC de 30 o ms se considera obesidad. Observe los niveles de colesterol y lpidos en la sangre.  Debe comenzar a rEnglish as a second language teacherde lpidos y cResearch officer, trade unionen la sangre a los 20aos y luego repetirlos cada 541aos  Es posible que nAutomotive engineerlos niveles de colesterol con mayor frecuencia si: ? Sus niveles de lpidos y colesterol son altos. ? Es mayor de [id number] ? Presenta un alto riesgo de padecer enfermedades cardacas. DETECCIN DE CNCER Cncer de pulmn  Se recomienda realizar exmenes de deteccin de cncer de pulmn a personas adultas entre 564y 876aos que estn en riesgo de dHorticulturist, commercialde pulmn por sus antecedentes de consumo de tabaco.  Se recomienda una tomografa computarizada de baja dosis de los pulmones todos los aos a las personas que: ? Fuman actualmente. ? Hayan dejado el hbito en algn momento en los ltimos 15aos. ? Hayan fumado durante 30aos un paquete diario. Un paquete-ao equivale a fumar un promedio de un paquete de cigarrillos diario durante un ao.  Los exmenes de deteccin anuales deben continuar hasta que hayan pasado 15aos desde que dej de fumar.  Ya no debern realizarse si tiene un problema de salud que le impida  recibir tratamiento para Science writer de pulmn. Cncer de mama  Practique la autoconciencia de la mama. Esto significa reconocer la apariencia normal de sus mamas y cmo las siente.  Tambin significa realizar autoexmenes regulares de [employer]. Informe a su mdico sobre cualquier cambio, sin importar cun  pequeo sea.  Si tiene entre 20 y 58 aos, un mdico debe realizarle un examen clnico de las mamas como parte del examen regular de [place], cada 1 a 3aos.  Si tiene 40aos o ms, debe Information systems manager clnico de las Microsoft. Tambin considere realizarse una [place] ([place]) todos los [place].  Si tiene antecedentes familiares de cncer de mama, hable con su mdico para someterse a un estudio gentico.  Si tiene alto riesgo de Chief Financial Officer de mama, hable con su mdico para someterse a Public house manager y [employer].  La evaluacin del gen del cncer de mama (BRCA) se recomienda a mujeres que tengan familiares con cnceres relacionados con el BRCA. Los cnceres relacionados con el BRCA incluyen los siguientes: ? [place]. ? Ovario. ? Trompas. ? Cnceres de peritoneo.  Los resultados de la evaluacin determinarn la necesidad de asesoramiento gentico y de [place] de BRCA1 y BRCA2. Cncer de cuello del tero El mdico puede recomendarle que se haga pruebas peridicas de deteccin de cncer de los rganos de la pelvis (ovarios, tero y vagina). Estas pruebas incluyen un examen plvico, que abarca controlar si se produjeron cambios microscpicos en la superficie del cuello del tero (prueba de Papanicolaou). Pueden recomendarle que se haga estas pruebas cada 3aos, a partir de los 21aos.  A las mujeres que tienen entre 30 y 46aos, los mdicos pueden recomendarles que se sometan a exmenes plvicos y pruebas de Papanicolaou cada 67aos, o a la prueba de Papanicolaou y el examen plvico en combinacin con estudios de deteccin del virus del papiloma humano (VPH) cada 5aos. Algunos tipos de VPH aumentan el riesgo de Chief Financial Officer de cuello del tero. La prueba para la deteccin del VPH tambin puede realizarse a mujeres de cualquier edad cuyos resultados de la prueba de Papanicolaou no sean claros.  Es posible que otros mdicos no  recomienden exmenes de deteccin a mujeres no embarazadas que se consideran sujetos de bajo riesgo de Chief Financial Officer de pelvis y que no tienen sntomas. Pregntele al mdico si un examen plvico de deteccin es adecuado para usted.  Si ha recibido un tratamiento para Science writer cervical o una enfermedad que podra causar cncer, necesitar realizarse una prueba de Papanicolaou y controles durante al menos 14 aos de concluido el [place]. Si no se ha hecho el Papanicolaou con regularidad, debern volver a evaluarse los factores de riesgo (como tener un nuevo compaero sexual), para Teacher, adult education si debe realizarse los estudios nuevamente. Algunas mujeres sufren problemas mdicos que aumentan la probabilidad de Museum/gallery curator cncer de cuello del tero. En estos casos, el mdico podr [employer] se realicen controles y pruebas de Papanicolaou con ms frecuencia. Cncer colorrectal  Este tipo de cncer puede detectarse y a menudo prevenirse.  Por lo general, los estudios de rutina se deben Medical laboratory scientific officer a Field seismologist a Proofreader de los 54 aos y [place] 24 aos.  Sin embargo, el mdico podr aconsejarle que lo haga antes, si tiene factores de riesgo para el cncer de colon.  Tambin puede recomendarle que use un kit de prueba para hallar sangre oculta en la materia fecal.  Es posible que se use una pequea  cmara en el extremo de un tubo para examinar directamente el colon (sigmoidoscopia o colonoscopia) a fin de Hydrographic surveyor formas tempranas de cncer colorrectal.  Los exmenes de rutina generalmente comienzan a los 22aos.  El examen directo del colon se debe repetir cada 5 a 10aos hasta los 75aos. Sin embargo, es posible que se realicen exmenes con mayor frecuencia, si se detectan formas tempranas de plipos precancerosos o pequeos bultos. Cncer de piel  Revise la piel de la cabeza a los pies con regularidad.  Informe a su mdico si aparecen nuevos lunares o los que tiene se modifican, especialmente en su  forma y color.  Tambin notifique al mdico si tiene un lunar que es ms grande que el tamao de una goma de lpiz.  Siempre use pantalla solar. Aplique pantalla solar de Kerry Dory y repetida a lo largo del Training and development officer.  Protjase usando mangas y [employer], un sombrero de ala ancha y gafas para el sol, siempre que se encuentre en el exterior. ENFERMEDADES CARDACAS, DIABETES E HIPERTENSIN ARTERIAL  La hipertensin arterial causa enfermedades cardacas y [place] el riesgo de ictus. La hipertensin arterial es ms probable en los siguientes casos: ? Las personas que tienen la presin arterial en el extremo del rango normal (100-139/85-89 mm Hg). ? [employer] con sobrepeso u obesidad. ? Scientist, water quality.  Si usted tiene entre 18 y 39 aos, debe medirse la presin arterial cada 3 a 5 aos. Si usted tiene 40 aos o ms, debe medirse la presin arterial [employer]. Debe medirse la presin arterial dos veces: una vez cuando est en un hospital o una clnica y la otra vez cuando est en otro sitio. Registre el promedio de [employer]. Para controlar su presin arterial cuando no est en un hospital o [place], puede usar lo siguiente: ? Jorje Guild automtica para medir la presin arterial en una farmacia. ? Un monitor para medir la presin arterial en el hogar.  Si tiene entre 39 y 59 aos, consulte a su mdico si debe tomar aspirina para prevenir el ictus.  Realcese exmenes de deteccin de la diabetes con regularidad. Esto incluye la toma de [place] de sangre para controlar el nivel de azcar en la sangre durante el [place]. ? Si tiene un peso normal y un bajo riesgo de padecer diabetes, realcese este anlisis cada tres aos despus de los 45aos. ? Si tiene sobrepeso y un alto riesgo de padecer diabetes, considere someterse a este anlisis antes o con mayor frecuencia. PREVENCIN DE INFECCIONES HepatitisB  Si tiene un riesgo ms alto de Museum/gallery curator hepatitis B,  debe someterse a un examen de deteccin de este virus. Se considera que tiene un alto riesgo de contraer hepatitis B si: ? Naci en un pas donde la hepatitis B es frecuente. Pregntele a su mdico qu pases son considerados de Public affairs consultant. ? Sus padres nacieron en un pas de alto riesgo y usted no recibi una vacuna que lo proteja contra la hepatitis B (vacuna contra la hepatitis B). ? [place]. ? [place] agujas para inyectarse drogas. ? Vive con alguien que tiene hepatitis B. ? Ha tenido sexo con alguien que tiene hepatitis B. ? Recibe tratamiento de hemodilisis. ? Toma ciertos medicamentos para el cncer, trasplante de rganos y afecciones autoinmunitarias. Hepatitis C  Se recomienda un anlisis de [place] para: ? [employer] 1945 y 1965. ? Todas las personas que tengan un riesgo de haber contrado hepatitis C. Enfermedades  de transmisin sexual (ETS).  Debe realizarse pruebas de deteccin de enfermedades de transmisin sexual (ETS), incluidas gonorrea y clamidia si: ? Es sexualmente activo y es menor de 54MGQ. ? Es mayor de 24aos, y Investment banker, operational informa que corre riesgo de tener este tipo de infecciones. ? La actividad sexual ha cambiado desde que le hicieron la ltima prueba de deteccin y tiene un riesgo mayor de Best boy clamidia o Radio broadcast assistant. Pregntele al mdico si usted tiene riesgo.  Si no tiene el VIH, pero corre riesgo de infectarse por el virus, se recomienda tomar diariamente un medicamento recetado para evitar la infeccin. Esto se conoce como profilaxis previa a la exposicin. Se considera que est en riesgo si: ? Es [place] sexualmente y no [place] preservativos habitualmente o no conoce el estado del VIH de sus Advertising copywriter. ? Se inyecta drogas. ? Es [place] sexualmente con Ardelia Mems pareja que tiene VIH. Consulte a su mdico para saber si tiene un alto riesgo de infectarse por el VIH. Si opta por comenzar la profilaxis previa a la exposicin, primero debe  realizarse anlisis de deteccin del VIH. Luego, le harn anlisis cada 56mses mientras est tomando los medicamentos para la profilaxis previa a la exposicin. [facility] Si es premenopusica y puede quedar e[place] solicite a su mdico asesoramiento previo a la concepcin.  Si puede quedar embarazada, tome 400 a [id number](mcg) de cido fAnheuser-Busch  Si desea evitar el embarazo, hable con su mdico sobre el control de la natalidad (anticoncepcin). OSTEOPOROSIS Y MENOPAUSIA  La osteoporosis es una enfermedad en la que los huesos pierden los minerales y la fuerza por el avance de la edad. El resultado pueden ser fracturas graves en los hCedar Point El riesgo de osteoporosis puede identificarse con uArdelia Memsprueba de densidad sea.  Si tiene 65aos o ms, o si est en riesgo de sufrir osteoporosis y fracturas, pregunte a su mdico si debe someterse a exmenes.  Consulte a su mdico si debe tomar un suplemento de calcio o de vitamina D para reducir el riesgo de osteoporosis.  La menopausia puede presentar ciertos sntomas fsicos y rGaffer  La terapia de reemplazo hormonal puede reducir algunos de estos sntomas y rGaffer Consulte a su mdico para saber si la terapia de reemplazo hormonal es conveniente para usted. INSTRUCCIONES PARA EL CUIDADO EN EL HOGAR  Realcese los estudios de rutina de la salud, dentales y de lPublic librarian  MColumbus AFB  No consuma ningn producto que contenga tabaco, lo que incluye cigarrillos, tabaco de mHigher education careers advisero cPsychologist, sport and exercise  Si est embarazada, no beba alcohol.  Si est amamantando, reduzca el consumo de alcohol y la frecuencia con la que consume.  Si es mujer y no est embarazada limite el consumo de alcohol a no ms de 1 medida por da. Una medida equivale a 12onzas de cerveza, 5onzas de vino o 1onzas de bebidas alcohlicas de alta graduacin.  No consuma drogas.  No comparta agujas.  Solicite ayuda a su  mdico si necesita apoyo o informacin para abandonar las drogas.  Informe a su mdico si a menudo se siente deprimido.  Notifique a su mdico si alguna vez ha sido vctima de abuso o si no se siente seguro en su hogar. Esta informacin no tiene cMarine scientistel consejo del mdico. Asegrese de hacerle al mdico cualquier pregunta que tenga. Document Released: 03/02/2011 Document Revised: 04/03/2014 Document Reviewed: 12/15/2014 Elsevier Interactive Patient Education  2[employer]

## 2016-10-25 ENCOUNTER — Other Ambulatory Visit: Payer: Self-pay | Admitting: Medical

## 2016-10-25 DIAGNOSIS — M8580 Other specified disorders of bone density and structure, unspecified site: Secondary | ICD-10-CM | POA: Insufficient documentation

## 2016-10-25 HISTORY — DX: Other specified disorders of bone density and structure, unspecified site: M85.80

## 2016-11-01 ENCOUNTER — Other Ambulatory Visit: Payer: Self-pay | Admitting: Gynecology

## 2016-11-01 DIAGNOSIS — Z780 Asymptomatic menopausal state: Secondary | ICD-10-CM

## 2016-11-09 ENCOUNTER — Encounter: Payer: Self-pay | Admitting: Medical

## 2016-11-09 ENCOUNTER — Other Ambulatory Visit: Payer: Self-pay | Admitting: Gynecology

## 2016-11-09 ENCOUNTER — Ambulatory Visit (INDEPENDENT_AMBULATORY_CARE_PROVIDER_SITE_OTHER): Payer: Medicare Other

## 2016-11-09 ENCOUNTER — Encounter: Payer: Self-pay | Admitting: Gynecology

## 2016-11-09 ENCOUNTER — Ambulatory Visit (INDEPENDENT_AMBULATORY_CARE_PROVIDER_SITE_OTHER): Payer: Medicare Other | Admitting: Medical

## 2016-11-09 VITALS — BP 111/74 | HR 97 | Temp 98.2°F | Resp 16 | Ht 62.0 in | Wt 170.4 lb

## 2016-11-09 DIAGNOSIS — R000 Tachycardia, unspecified: Secondary | ICD-10-CM

## 2016-11-09 DIAGNOSIS — Z780 Asymptomatic menopausal state: Secondary | ICD-10-CM

## 2016-11-09 DIAGNOSIS — E118 Type 2 diabetes mellitus with unspecified complications: Secondary | ICD-10-CM | POA: Diagnosis not present

## 2016-11-09 DIAGNOSIS — Z794 Long term (current) use of insulin: Secondary | ICD-10-CM

## 2016-11-09 DIAGNOSIS — M8588 Other specified disorders of bone density and structure, other site: Secondary | ICD-10-CM

## 2016-11-09 DIAGNOSIS — E785 Hyperlipidemia, unspecified: Secondary | ICD-10-CM | POA: Diagnosis not present

## 2016-11-09 DIAGNOSIS — E559 Vitamin D deficiency, unspecified: Secondary | ICD-10-CM

## 2016-11-09 DIAGNOSIS — R7989 Other specified abnormal findings of blood chemistry: Secondary | ICD-10-CM

## 2016-11-09 LAB — LIPID PANEL
CHOL/HDL RATIO: 3
Cholesterol: 141 mg/dL (ref 0–200)
HDL: 44.1 mg/dL (ref >39.00)
LDL CALC: 61 mg/dL (ref 0–99)
NONHDL: 96.68
Triglycerides: 179 mg/dL — ABNORMAL HIGH (ref 0.0–149.0)
VLDL: 35.8 mg/dL (ref 0.0–40.0)

## 2016-11-09 LAB — COMPREHENSIVE METABOLIC PANEL
ALK PHOS: 95 U/L (ref 39–117)
ALT: 16 U/L (ref 0–35)
AST: 16 U/L (ref 0–37)
Albumin: 4 g/dL (ref 3.5–5.2)
BUN: 15 mg/dL (ref 6–23)
CHLORIDE: 106 meq/L (ref 96–112)
CO2: 25 meq/L (ref 19–32)
Calcium: 9.6 mg/dL (ref 8.4–10.5)
Creatinine, Ser: 0.65 mg/dL (ref 0.40–1.20)
GFR: 96.85 mL/min (ref >60.00)
GLUCOSE: 115 mg/dL — AB (ref 70–99)
POTASSIUM: 4 meq/L (ref 3.5–5.1)
SODIUM: 138 meq/L (ref 135–145)
Total Bilirubin: 0.4 mg/dL (ref 0.2–1.2)
Total Protein: 7.3 g/dL (ref 6.0–8.3)

## 2016-11-09 LAB — HEMOGLOBIN A1C: HEMOGLOBIN A1C: 6.4 % (ref 4.6–6.5)

## 2016-11-09 NOTE — Progress Notes (Signed)
Subjective:    Patient ID: Janet Peters, female    DOB: Mar 11, 1951, 66 y.o.   MRN: [id number]  HPI  Pt in and bp is controlled.. 111/74.  She checks at home one time a week. She states about the same.  Pt is fasting. 4 month ago cholesterol was controlled. On lipitor.  Pt has hx of vitamin d low. On 5,000 units a day.  Pt sugar average have been mild high. She has been using metformin. She is exercising.   Review of Systems  Constitutional: Negative for chills and fatigue.  Respiratory: Negative for cough, chest tightness, shortness of breath and wheezing.   Cardiovascular: Negative for chest pain and palpitations.  Gastrointestinal: Negative for abdominal pain.  Genitourinary: Negative for difficulty urinating, dysuria, frequency, hematuria and urgency.  Musculoskeletal: Negative for back pain.  Skin: Negative for rash.  Neurological: Negative for dizziness and headaches.  Hematological: Negative for adenopathy. Does not bruise/bleed easily.  Psychiatric/Behavioral: Negative for behavioral problems, dysphoric mood and sleep disturbance. The patient is not nervous/anxious.    Past Medical History:  Diagnosis Date  . Diabetes mellitus without complication ([place])   . Hyperlipidemia      Social History   Social History  . Marital status: Married    Spouse name: N/A  . Number of children: N/A  . Years of education: N/A   Occupational History  . Not on file.   Social History Main Topics  . Smoking status: Never Smoker  . Smokeless tobacco: Never Used  . Alcohol use No  . Drug use: No  . Sexual activity: Yes    Partners: Male     Comment: 1st intercourse- 45, partners - 2    Other Topics Concern  . Not on file   Social History Narrative  . No narrative on file    Past Surgical History:  Procedure Laterality Date  . ABDOMINAL HYSTERECTOMY    . CESAREAN SECTION     x2  . CHOLECYSTECTOMY, LAPAROSCOPIC    . KNEE SURGERY Left 2011    Family History    Problem Relation Age of Onset  . Heart attack Maternal Grandmother     No Known Allergies  Current Outpatient Prescriptions on File Prior to Visit  Medication Sig Dispense Refill  . atorvastatin (LIPITOR) 10 MG tablet TAKE 1 TABLET(10 MG) BY MOUTH DAILY 90 tablet 3  . Cholecalciferol (VITAMIN D3) 5000 units CAPS Take by mouth.    Marland Kitchen CINNAMON PO Take by mouth.    . Magnesium 400 MG CAPS Take 1 capsule by mouth daily.    . metFORMIN (GLUCOPHAGE) 500 MG tablet TAKE 1 1/2 TABLETS BY MOUTH DAILY WITH BREAKFAST 135 tablet 0   No current facility-administered medications on file prior to visit.     BP 111/74   Pulse (!) 109   Temp 98.2 F (36.8 C) (Oral)   Resp 16   Ht 5\' 2"  (1.575 m)   Wt 170 lb 6.4 oz (77.3 kg)   SpO2 99%   BMI 31.17 kg/m       Objective:   Physical Exam   General Mental Status- Alert. General Appearance- Not in acute distress.   Skin General: Color- Normal Color. Moisture- Normal Moisture.  Neck Carotid Arteries- Normal color. Moisture- Normal Moisture. No carotid bruits. No JVD.  Chest and Lung Exam Auscultation: Breath Sounds:-Normal.  Cardiovascular Auscultation:Rythm- Regular. Murmurs & Other Heart Sounds:Auscultation of the heart reveals- No Murmurs.  Abdomen Inspection:-Inspeection Normal. Palpation/Percussion:Note:No mass. Palpation and  Percussion of the abdomen reveal- Non Tender, Non Distended + BS, no rebound or guarding.   Neurologic Cranial Nerve exam:- CN III-XII intact(No nystagmus), symmetric smile. Strength:- 5/5 equal and symmetric strength both upper and lower extremities.     Assessment & Plan:  For history of high sugar continue metformin. Will get a1c today.  For high cholesterol check lipid panel today. Adjust med if needed.  Recheck vit d.  Your pulse is good today. Check bp daily for one week when rested and note pulse reading if consistent 60-100. Send [place] update in one week.  Follow up 3-6 months depending on  lab review.  Harvie Heck, Percell Miller, PA-C

## 2016-11-09 NOTE — Patient Instructions (Signed)
For history of high sugar continue metformin. Will get a1c today.  For high cholesterol check lipid panel today. Adjust med if needed.  Recheck vit d.  Your pulse is good today. Check bp daily for one week when rested and note pulse reading if consistent 60-100. Send [place] update in one week.  Follow up 3-6 months depending on lab review.

## 2016-11-12 LAB — VITAMIN D 1,25 DIHYDROXY
VITAMIN D 1, 25 (OH) TOTAL: 38 pg/mL (ref 18–72)
Vitamin D2 1, 25 (OH)2: <8 pg/mL
Vitamin D3 1, 25 (OH)2: 38 pg/mL

## 2016-11-28 ENCOUNTER — Other Ambulatory Visit: Payer: Self-pay | Admitting: Medical

## 2016-12-12 ENCOUNTER — Encounter: Payer: Self-pay | Admitting: Medical

## 2016-12-12 ENCOUNTER — Ambulatory Visit (INDEPENDENT_AMBULATORY_CARE_PROVIDER_SITE_OTHER): Payer: Medicare Other | Admitting: Medical

## 2016-12-12 ENCOUNTER — Other Ambulatory Visit: Payer: Self-pay | Admitting: Medical

## 2016-12-12 ENCOUNTER — Ambulatory Visit (HOSPITAL_BASED_OUTPATIENT_CLINIC_OR_DEPARTMENT_OTHER)
Admission: RE | Admit: 2016-12-12 | Discharge: 2016-12-12 | Disposition: A | Discharge status: Home / Self Care | Payer: Medicare Other | Source: Ambulatory Visit | Attending: Medical | Admitting: Medical

## 2016-12-12 VITALS — BP 131/76 | HR 102 | Temp 97.7°F | Resp 16 | Ht 62.0 in | Wt 172.4 lb

## 2016-12-12 DIAGNOSIS — R102 Pelvic and perineal pain: Secondary | ICD-10-CM

## 2016-12-12 DIAGNOSIS — M5441 Lumbago with sciatica, right side: Secondary | ICD-10-CM | POA: Diagnosis not present

## 2016-12-12 DIAGNOSIS — Z90710 Acquired absence of both cervix and uterus: Secondary | ICD-10-CM | POA: Diagnosis not present

## 2016-12-12 LAB — POC URINALSYSI DIPSTICK (AUTOMATED)
Bilirubin, UA: NEGATIVE
Blood, UA: NEGATIVE
Glucose, UA: NEGATIVE
KETONES UA: NEGATIVE
LEUKOCYTES UA: NEGATIVE
NITRITE UA: NEGATIVE
PH UA: 6 (ref 5.0–8.0)
PROTEIN UA: NEGATIVE
Spec Grav, UA: 1.02 (ref 1.010–1.025)
UROBILINOGEN UA: NEGATIVE U/dL — AB

## 2016-12-12 MED ORDER — DICLOFENAC SODIUM 75 MG PO TBEC: 75.0000 mg | DELAYED_RELEASE_TABLET | Freq: Two times a day (BID) | ORAL | 0 refills | Status: DC

## 2016-12-12 NOTE — Progress Notes (Signed)
Subjective:    Patient ID: Janet Peters, female    DOB: Feb 25, 1951, 66 y.o.   MRN: [id number]  HPI  Pt in for some rt lower/inguinal region pain. Pain is worst when lying back toward to her rt side. Also some pain when sits. Pain is not constant. Pt states pain more prevalent last 2-3 months. She associated pain in rt lower back with rt groin region pain. When has pain it is 5/10. She states diclofenac seems to help with the pain.  No pain urinating. No uti type signs or symptoms reported.  Pt has no uterus. Hx of hysterectomy in 80's. Does have ovaries. No hx of hernia repair.   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  [place] 2017 abdomen was negative.  Pt states pain can radiate from groin to rt lower back and from back to rt lower groin area.  Pain more prevalent in groin area.   Review of Systems  Constitutional: Negative for chills, fatigue and fever.  Respiratory: Negative for cough, chest tightness, shortness of breath and wheezing.   Cardiovascular: Negative for chest pain and palpitations.  Gastrointestinal: Positive for abdominal pain. Negative for abdominal distention, constipation, diarrhea, nausea, rectal pain and vomiting.  Genitourinary: Negative for difficulty urinating, dysuria, frequency, genital sores, pelvic pain and urgency.  Musculoskeletal: Positive for back pain. Negative for joint swelling and neck stiffness.  Skin: Negative for rash.  Neurological: Negative for dizziness, syncope, weakness, numbness and headaches.  Hematological: Negative for adenopathy. Does not bruise/bleed easily.  Psychiatric/Behavioral: Negative for behavioral problems and confusion.   Past Medical History:  Diagnosis Date  . Diabetes mellitus without complication ([place])   . Hyperlipidemia   . Osteopenia 10/2016   T score -1.3 FRAX 3.5%/0.1%     Social History   Social History  . Marital status: Married   Spouse name: N/A  . Number of children: N/A  . Years of education: N/A   Occupational History  . Not on file.   Social History Main Topics  . Smoking status: Never Smoker  . Smokeless tobacco: Never Used  . Alcohol use No  . Drug use: No  . Sexual activity: Yes    Partners: Male     Comment: 1st intercourse- 56, partners - 2    Other Topics Concern  . Not on file   Social History Narrative  . No narrative on file    Past Surgical History:  Procedure Laterality Date  . ABDOMINAL HYSTERECTOMY    . CESAREAN SECTION     x2  . CHOLECYSTECTOMY, LAPAROSCOPIC    . KNEE SURGERY Left 2011    Family History  Problem Relation Age of Onset  . Heart attack Maternal Grandmother     No Known Allergies  Current Outpatient Prescriptions on File Prior to Visit  Medication Sig Dispense Refill  . atorvastatin (LIPITOR) 10 MG tablet TAKE 1 TABLET(10 MG) BY MOUTH DAILY 90 tablet 3  . atorvastatin (LIPITOR) 10 MG tablet TAKE 1 TABLET(10 MG) BY MOUTH DAILY 30 tablet 0  . Cholecalciferol (VITAMIN D3) 5000 units CAPS Take by mouth.    Marland Kitchen CINNAMON PO Take by mouth.    . Magnesium 400 MG CAPS Take 1 capsule by mouth daily.    . metFORMIN (GLUCOPHAGE) 500 MG tablet TAKE 1 1/2 TABLETS BY MOUTH DAILY WITH BREAKFAST 135 tablet 0   No current facility-administered medications on file prior to visit.  BP 131/76   Pulse (!) 102   Temp 97.7 F (36.5 C) (Oral)   Resp 16   Ht 5\' 2"  (1.575 m)   Wt 172 lb 6.4 oz (78.2 kg)   SpO2 99%   BMI 31.53 kg/m       Objective:   Physical Exam  General Appearance- Not in acute distress.  HEENT Eyes- Scleraeral/Conjuntiva-bilat- Not Yellow. Mouth & Throat- Normal.  Chest and Lung Exam Auscultation: Breath sounds:-Normal. Adventitious sounds:- No Adventitious sounds.  Cardiovascular Auscultation:Rythm - Regular. Heart Sounds -Normal heart sounds.  Abdomen Inspection:-Inspection Normal.  Palpation/Perucssion: Palpation and Percussion of  the abdomen reveal- faint rt groin region pain but no mass/no obvious hernia felt. No Rebound tenderness, No rigidity(Guarding) and No Palpable abdominal masses.  Liver:-Normal.  Spleen:- Normal.    Back No mid lumbar spine tenderness to palpation. Rt si tenderness No pain on straight leg lift. Pain on lateral movements and flexion/extension of the spine.  Lower ext neurologic  L5-S1 sensation intact bilaterally. Normal patellar reflexes bilaterally. No foot drop bilaterally.      Assessment & Plan:  For your right groin region pain and right SI region pain, I will prescribe diclofenac again.  Your urine was clear for any blood. I decided on getting ultrasound studies to evaluate both the groin region and your right ovary. I have asked ultrasound staff to evaluate if you have hernia in the region as well as to check the ovary. If the ultrasound is negative I would consider  CT of the abdomen and pelvis as next step.  Please go downstairs to schedule the ultrasound studies.  Follow-up date will be determined after I review the ultrasound.  In the event of any severe excruciating pain and abdomen/pelvis region, then would recommend ED evaluation.   Seryna Marek, Percell Miller, PA-C

## 2016-12-12 NOTE — Patient Instructions (Addendum)
For your right groin region pain and right SI region pain, I will prescribe diclofenac again.  Your urine was clear for any blood. I decided on getting ultrasound studies to evaluate both the groin region and your right ovary. I have asked ultrasound staff to evaluate if you have hernia in the region as well as to check the ovary. If the ultrasound is negative I would consider CT of the abdomen and pelvis.  Please go downstairs to schedule the ultrasound studies.  Follow-up date will be determined after I review the ultrasound.  In the event of any severe excruciating pain and abdomen/pelvis region, then would recommend ED evaluation.

## 2016-12-14 ENCOUNTER — Telehealth: Payer: Self-pay | Admitting: Medical

## 2016-12-14 NOTE — Telephone Encounter (Signed)
-----   Message from Mackie Pai, PA-C sent at 12/12/2016  4:59 PM EDT ----- Ovary looks normal on right side by ultrasound. No hernia seen on exam. If  her pain is mostly still in rt lower abdomen then will order ct abdomen/pelvis. She also reported some back pain. I will order ct abdomen/pelvis but confirm first that pain not in back since she mentioned both areas but got impression most of time pain is in rt lower abdomen area. Let me know what she says.

## 2016-12-14 NOTE — Telephone Encounter (Signed)
Spoke with pt and informed the below, pt stated that her mayor pain is most of the time from right lower back side and it makes the pain go toward front side, pain is still going on since pt states she has a hernia in that area. Pt stated if provider decided to send her to have another order for the hernia area it is on her back side. Please advise pt if she needs to have another exam.

## 2016-12-18 ENCOUNTER — Other Ambulatory Visit: Payer: Self-pay | Admitting: Medical

## 2016-12-27 ENCOUNTER — Other Ambulatory Visit: Payer: Self-pay | Admitting: Medical

## 2016-12-27 NOTE — Telephone Encounter (Signed)
On 6.19.18 #[id number]+3 was faxed/thx dmf

## 2017-02-08 ENCOUNTER — Other Ambulatory Visit: Payer: Self-pay | Admitting: Medical

## 2017-02-08 DIAGNOSIS — Z1231 Encounter for screening mammogram for malignant neoplasm of breast: Secondary | ICD-10-CM

## 2017-02-26 ENCOUNTER — Encounter: Payer: Self-pay | Admitting: Medical

## 2017-02-26 ENCOUNTER — Ambulatory Visit (INDEPENDENT_AMBULATORY_CARE_PROVIDER_SITE_OTHER): Payer: Medicare Other | Admitting: Medical

## 2017-02-26 ENCOUNTER — Other Ambulatory Visit: Payer: Self-pay | Admitting: Medical

## 2017-02-26 ENCOUNTER — Telehealth: Payer: Self-pay | Admitting: Medical

## 2017-02-26 VITALS — BP 125/76 | HR 89 | Temp 97.5°F | Resp 16 | Wt 171.0 lb

## 2017-02-26 DIAGNOSIS — E785 Hyperlipidemia, unspecified: Secondary | ICD-10-CM | POA: Diagnosis not present

## 2017-02-26 DIAGNOSIS — E669 Obesity, unspecified: Secondary | ICD-10-CM

## 2017-02-26 DIAGNOSIS — E119 Type 2 diabetes mellitus without complications: Secondary | ICD-10-CM | POA: Diagnosis not present

## 2017-02-26 LAB — HEMOGLOBIN A1C: HEMOGLOBIN A1C: 6.5 % (ref 4.6–6.5)

## 2017-02-26 MED ORDER — LORCASERIN HCL 10 MG PO TABS: ORAL_TABLET | ORAL | 2 refills | Status: DC

## 2017-02-26 MED ORDER — FLUTICASONE PROPIONATE 50 MCG/ACT NA SUSP: 2.0000 | Freq: Every day | NASAL | 1 refills | Status: DC

## 2017-02-26 NOTE — Telephone Encounter (Signed)
Pt was informed about meter that was sent to pharmacy and also was informed if pt wanted to have her flu shot done, so we can schedule the appt, pt does not want to have the flu shot done.

## 2017-02-26 NOTE — Progress Notes (Signed)
Subjective:    Patient ID: Janet Peters, female    DOB: 09-Jul-1950, 66 y.o.   MRN: [id number]  HPI   Pt in for some concern for recent increase high blood sugars.   Pt is going to gym 4-5 times a week. Pt is riding bicycle at gym and lifting.  Pt little frustrated since her weight has not improved.  Pt frustrated that weight has not decreased.  Pt 7 day blood sugar was 117, 14 day average 115, 30 day average 113, 90 day average is 112.    Last a1-c  Was a1-c was 6.3.  Pt pulse was in our office was 89.    Review of Systems  Constitutional: Negative for chills, fatigue and fever.  Respiratory: Negative for cough, chest tightness, shortness of breath and wheezing.   Cardiovascular: Negative for chest pain and palpitations.  Gastrointestinal: Negative for abdominal pain, constipation and vomiting.  Musculoskeletal: Negative for back pain, joint swelling and neck stiffness.  Neurological: Negative for dizziness, syncope, weakness, numbness and headaches.  Hematological: Does not bruise/bleed easily.  Psychiatric/Behavioral: Negative for behavioral problems and confusion.    Past Medical History:  Diagnosis Date  . Diabetes mellitus without complication ([place])   . Hyperlipidemia   . Osteopenia 10/2016   T score -1.3 FRAX 3.5%/0.1%     Social History   Socioeconomic History  . Marital status: Married    Spouse name: Not on file  . Number of children: Not on file  . Years of education: Not on file  . Highest education level: Not on file  Social Needs  . Financial resource strain: Not on file  . Food insecurity - worry: Not on file  . Food insecurity - inability: Not on file  . Transportation needs - medical: Not on file  . Transportation needs - non-medical: Not on file  Occupational History  . Not on file  Tobacco Use  . Smoking status: Never Smoker  . Smokeless tobacco: Never Used  Substance and Sexual Activity  . Alcohol use: No  . Drug use: No  .  Sexual activity: Yes    Partners: Male    Comment: 1st intercourse- 74, partners - 2   Other Topics Concern  . Not on file  Social History Narrative  . Not on file    Past Surgical History:  Procedure Laterality Date  . ABDOMINAL HYSTERECTOMY    . CESAREAN SECTION     x2  . CHOLECYSTECTOMY, LAPAROSCOPIC    . KNEE SURGERY Left 2011    Family History  Problem Relation Age of Onset  . Heart attack Maternal Grandmother     No Known Allergies  Current Outpatient Medications on File Prior to Visit  Medication Sig Dispense Refill  . atorvastatin (LIPITOR) 10 MG tablet TAKE 1 TABLET(10 MG) BY MOUTH DAILY 90 tablet 3  . atorvastatin (LIPITOR) 10 MG tablet TAKE 1 TABLET(10 MG) BY MOUTH DAILY 30 tablet 0  . Cholecalciferol (VITAMIN D3) 5000 units CAPS Take by mouth.    Marland Kitchen CINNAMON PO Take by mouth.    . diclofenac (VOLTAREN) 75 MG EC tablet Take 1 tablet (75 mg total) by mouth 2 (two) times daily. 30 tablet 0  . Magnesium 400 MG CAPS Take 1 capsule by mouth daily.    . metFORMIN (GLUCOPHAGE) 500 MG tablet TAKE 1 1/2 TABLETS BY MOUTH DAILY WITH BREAKFAST 135 tablet 1   No current facility-administered medications on file prior to visit.     BP  125/76   Pulse (!) 107   Temp (!) 97.5 F (36.4 C) (Oral)   Resp 16   Wt 171 lb (77.6 kg)   SpO2 100%   BMI 31.28 kg/m       Objective:   Physical Exam   General Mental Status- Alert. General Appearance- Not in acute distress.   Skin General: Color- Normal Color. Moisture- Normal Moisture.  Neck Carotid Arteries- Normal color. Moisture- Normal Moisture. No carotid bruits. No JVD.  Chest and Lung Exam Auscultation: Breath Sounds:-Normal.  Cardiovascular Auscultation:Rythm- Regular. Murmurs & Other Heart Sounds:Auscultation of the heart reveals- No Murmurs.  Abdomen Inspection:-Inspeection Normal. Palpation/Percussion:Note:No mass. Palpation and Percussion of the abdomen reveal- Non Tender, Non Distended + BS, no rebound  or guarding.   Neurologic Cranial Nerve exam:- CN III-XII intact(No nystagmus), symmetric smile. Strength:- 5/5 equal and symmetric strength both upper and lower extremities.      Assessment & Plan:  Your blood sugar readings recently are not really not that high.  For person your age often times we consider hemoglobin A1c less than 7 acceptable.  You are on low dose Metformin and will repeat A1c today.  It is possible after reviewing A1c will advise increasing dosage of metformin.  For obesity,  I recommend continue diet and exercise. Will prescribe belvique and see if med covered.  Other option might be to refer you to weight loss specialist.  Today we will check your check your metabolic panel and lipid panel.  Refill medications accordingly.  Your pulse was 89 today when I checked.  Recommend you can get a cheap pulse ox/pulse monitor at [facility].  Check when you are resting and confirmed pulse between 60-100.    Also referred you to nutritionist to discuss diabetic diet as well as overall healthy diet for weight loss.   Follow-up in 3 months or as needed.  As always signing her chart today I noticed that she not had flu vaccine per epic.  So I will send Geni Bers a note and see if she would call patient and offered to come back in just for nurse flu vaccine visit.  Gleen Ripberger, Percell Miller, PA-C

## 2017-02-26 NOTE — Telephone Encounter (Signed)
Meter sent to pharmacy 

## 2017-02-26 NOTE — Telephone Encounter (Signed)
Copied from [place] [id number]. Topic: Quick Communication - See Telephone Encounter >> Feb 26, 2017  9:59 AM Rosalin Hawking wrote: CRM for notification. See Telephone encounter for:  02/26/17.    Pt was seen today and stated that provider needed to know with insurance what type of sugar meter will cover, pt verified with insurance and they informed pt that insurance will cover ACCU-chek. Please send to pharmacy for pt to pick up Accu-chek. Please advise.

## 2017-02-26 NOTE — Patient Instructions (Addendum)
Your blood sugar readings recently are not really not that high.  For person your age often times we consider hemoglobin A1c less than 7 acceptable.  You are on low dose Metformin and will repeat A1c today.  It is possible after reviewing A1c will advise increasing dosage of metformin.  For obesity,  I recommend continue diet and exercise. Will prescribe belvique and see if med covered.  Other option might be to refer you to weight loss specialist.  Today we will check your check your metabolic panel and lipid panel.  Refill medications accordingly.  Your pulse was 89 today when I checked.  Recommend you can get a cheap pulse ox/pulse monitor at [facility].  Check when you are resting and confirmed pulse between 60-100.    Also referred you to nutritionist to discuss diabetic diet as well as overall healthy diet for weight loss.   Follow-up in 3 months or as needed.

## 2017-02-27 LAB — COMPREHENSIVE METABOLIC PANEL
ALBUMIN: 4.4 g/dL (ref 3.5–5.2)
ALK PHOS: 92 U/L (ref 39–117)
ALT: 15 U/L (ref 0–35)
AST: 15 U/L (ref 0–37)
BUN: 15 mg/dL (ref 6–23)
CHLORIDE: 105 meq/L (ref 96–112)
CO2: 25 mEq/L (ref 19–32)
Calcium: 9.8 mg/dL (ref 8.4–10.5)
Creatinine, Ser: 0.62 mg/dL (ref 0.40–1.20)
GFR: 102.19 mL/min (ref >60.00)
GLUCOSE: 107 mg/dL — AB (ref 70–99)
POTASSIUM: 4.1 meq/L (ref 3.5–5.1)
SODIUM: 140 meq/L (ref 135–145)
TOTAL PROTEIN: 7.9 g/dL (ref 6.0–8.3)
Total Bilirubin: 0.3 mg/dL (ref 0.2–1.2)

## 2017-02-27 LAB — LIPID PANEL
CHOLESTEROL: 183 mg/dL (ref 0–200)
HDL: 48.8 mg/dL (ref >39.00)
LDL CALC: 97 mg/dL (ref 0–99)
NonHDL: 133.73
Total CHOL/HDL Ratio: 4
Triglycerides: 184 mg/dL — ABNORMAL HIGH (ref 0.0–149.0)
VLDL: 36.8 mg/dL (ref 0.0–40.0)

## 2017-02-27 LAB — MICROALBUMIN, URINE: Microalb, Ur: 0.5 mg/dL

## 2017-03-02 ENCOUNTER — Telehealth: Payer: Self-pay | Admitting: Medical

## 2017-03-02 NOTE — Telephone Encounter (Signed)
Pt was informed the below. Pt understood.

## 2017-03-02 NOTE — Telephone Encounter (Signed)
-----   Message from Mackie Pai, PA-C sent at 02/27/2017  6:11 PM EST ----- Patient's metabolic panel shows minimal sugar elevation at level of 107.  He has good kidney function and normal liver enzymes.  Lipid panel looks good except slight increase triglycerides.  Triglyceride levels are not that high and sometimes can be increased with sugar levels.  Urine microalbumin was normal.  No abnormal protein in urine.

## 2017-03-13 ENCOUNTER — Ambulatory Visit (HOSPITAL_BASED_OUTPATIENT_CLINIC_OR_DEPARTMENT_OTHER)
Admission: RE | Admit: 2017-03-13 | Discharge: 2017-03-13 | Disposition: A | Discharge status: Home / Self Care | Payer: Medicare Other | Source: Ambulatory Visit | Attending: Medical | Admitting: Medical

## 2017-03-13 DIAGNOSIS — Z1231 Encounter for screening mammogram for malignant neoplasm of breast: Secondary | ICD-10-CM | POA: Insufficient documentation

## 2017-03-14 ENCOUNTER — Telehealth: Payer: Self-pay | Admitting: Medical

## 2017-03-14 ENCOUNTER — Encounter: Payer: Self-pay | Admitting: Registered"

## 2017-03-14 ENCOUNTER — Encounter: Payer: Medicare Other | Attending: Medical | Admitting: Registered"

## 2017-03-14 DIAGNOSIS — E119 Type 2 diabetes mellitus without complications: Secondary | ICD-10-CM

## 2017-03-14 DIAGNOSIS — E669 Obesity, unspecified: Secondary | ICD-10-CM | POA: Insufficient documentation

## 2017-03-14 DIAGNOSIS — Z6830 Body mass index (BMI) 30.0-30.9, adult: Secondary | ICD-10-CM | POA: Diagnosis not present

## 2017-03-14 DIAGNOSIS — Z713 Dietary counseling and surveillance: Secondary | ICD-10-CM | POA: Insufficient documentation

## 2017-03-14 NOTE — Telephone Encounter (Signed)
-----   Message from Mackie Pai, PA-C sent at 03/13/2017  6:36 PM EST ----- Mammogram was negative.  Recommend repeat in one year.  Patient is Spanish-speaking or so please notify her.

## 2017-03-14 NOTE — Telephone Encounter (Signed)
Pt was informed her results and understood.

## 2017-03-14 NOTE — Progress Notes (Signed)
Diabetes Self-Management Education  Visit Type: First/Initial  Appt. Start Time: 1405 Appt. End Time: 3474  03/14/2017  [name], identified by name and date of birth, is a 66 y.o. female with a diagnosis of Diabetes: Type 2.   ASSESSMENT Pt states she wants to control blood sugar and lose weight. Per diet recall patient consumes 3 reasonable meals per day, no sweetened drinks, and has 4 days per week exercise. Pt states with her first child she gained 80 lbs. Pt states she is aware of thyroid affect on weight and sounds like doctors have looked into this.   Patient reports she checks her BG about 3 times per week unless she she sees a higher number and will check more often until the number goes back down to low 100's. Pt reported awareness of effect of exercise to lower sugar.   Patient states her sleep is not good, only about 4 hrs broken sleep per night. Patient states she is sleepy during the day, but does not take naps.  Height 5\' 2"  (1.575 m), weight 174 lb 14.4 oz (79.3 kg). Body mass index is 31.99 kg/m.  Diabetes Self-Management Education - 03/14/17 1413      Visit Information   Visit Type  First/Initial      Initial Visit   Diabetes Type  Type 2    Are you taking your medications as prescribed?  No    Date Diagnosed  2014      Health Coping   How would you rate your overall health?  Fair      Psychosocial Assessment   Patient Belief/Attitude about Diabetes  Motivated to manage diabetes      Complications   Last HgB A1C per patient/outside source  6.5 %    How often do you check your blood sugar?  1-2 times/day    Fasting Blood glucose range (mg/dL)  70-129    Have you had a dilated eye exam in the past 12 months?  Yes    Have you had a dental exam in the past 12 months?  Yes    Are you checking your feet?  Yes    How many days per week are you checking your feet?  7      Dietary Intake   Breakfast  2x week scrambled eggs bread, coffee OR  oatmeal, 1/4 banana flaxseed, chia, almond OR paleo pancake 16 g cho/7 g pro, strawberry, banana OR ezekeil bread with fruit spread.    Snack (morning)  fruit OR nuts, water    Lunch  none OR beans, small piece of meat     Snack (afternoon)  coffee, cookie dry cookies    Dinner  salad, spinach    Snack (evening)  none    Beverage(s)  water, coffee      Exercise   Exercise Type  Moderate (swimming / aerobic walking)    How many days per week to you exercise?  4    How many minutes per day do you exercise?  80    Total minutes per week of exercise  320      Patient Education   Previous Diabetes Education  No    Nutrition management   Role of diet in the treatment of diabetes and the relationship between the three main macronutrients and blood glucose level;Carbohydrate counting;Food label reading, portion sizes and measuring food.      Individualized Goals (developed by patient)   Nutrition  General guidelines for healthy  choices and portions discussed    Physical Activity  Exercise 3-5 times per week      Outcomes   Expected Outcomes  Demonstrated interest in learning. Expect positive outcomes    Future DMSE  PRN    Program Status  Completed     Individualized Plan for Diabetes Self-Management Training:   Learning Objective:  Patient will have a greater understanding of diabetes self-management. Patient education plan is to attend individual and/or group sessions per assessed needs and concerns.  Expected Outcomes:  Demonstrated interest in learning. Expect positive outcomes  Education material provided: Carbohydrate counting sheet, Healthy Meal Planning (novo nordisk), sleep hygiene  If problems or questions, patient to contact team via:  Phone  Future DSME appointment: PRN

## 2017-04-24 DIAGNOSIS — H2512 Age-related nuclear cataract, left eye: Secondary | ICD-10-CM | POA: Insufficient documentation

## 2017-04-24 HISTORY — DX: Age-related nuclear cataract, left eye: H25.12

## 2017-05-17 ENCOUNTER — Ambulatory Visit: Payer: Medicare Other | Admitting: Medical

## 2017-06-14 ENCOUNTER — Ambulatory Visit (INDEPENDENT_AMBULATORY_CARE_PROVIDER_SITE_OTHER): Payer: Medicare Other | Admitting: Medical

## 2017-06-14 ENCOUNTER — Encounter: Payer: Self-pay | Admitting: Medical

## 2017-06-14 VITALS — BP 132/76 | HR 88 | Temp 98.0°F | Resp 16 | Ht 62.0 in | Wt 172.2 lb

## 2017-06-14 DIAGNOSIS — R5383 Other fatigue: Secondary | ICD-10-CM | POA: Diagnosis not present

## 2017-06-14 DIAGNOSIS — B351 Tinea unguium: Secondary | ICD-10-CM

## 2017-06-14 DIAGNOSIS — R0789 Other chest pain: Secondary | ICD-10-CM | POA: Diagnosis not present

## 2017-06-14 DIAGNOSIS — M25562 Pain in left knee: Secondary | ICD-10-CM

## 2017-06-14 DIAGNOSIS — Z8639 Personal history of other endocrine, nutritional and metabolic disease: Secondary | ICD-10-CM | POA: Diagnosis not present

## 2017-06-14 DIAGNOSIS — E119 Type 2 diabetes mellitus without complications: Secondary | ICD-10-CM

## 2017-06-14 DIAGNOSIS — E785 Hyperlipidemia, unspecified: Secondary | ICD-10-CM | POA: Diagnosis not present

## 2017-06-14 DIAGNOSIS — G8929 Other chronic pain: Secondary | ICD-10-CM

## 2017-06-14 LAB — CBC WITH DIFFERENTIAL/PLATELET
BASOS ABS: 0 10*3/uL (ref 0.0–0.1)
Basophils Relative: 0.4 % (ref 0.0–3.0)
EOS PCT: 2.6 % (ref 0.0–5.0)
Eosinophils Absolute: 0.2 10*3/uL (ref 0.0–0.7)
HCT: 40.2 % (ref 36.0–46.0)
HEMOGLOBIN: 13.4 g/dL (ref 12.0–15.0)
Lymphocytes Relative: 35.1 % (ref 12.0–46.0)
Lymphs Abs: 2.7 10*3/uL (ref 0.7–4.0)
MCHC: 33.3 g/dL (ref 30.0–36.0)
MCV: 88.6 fl (ref 78.0–100.0)
MONOS PCT: 5.8 % (ref 3.0–12.0)
Monocytes Absolute: 0.5 10*3/uL (ref 0.1–1.0)
Neutro Abs: 4.4 10*3/uL (ref 1.4–7.7)
Neutrophils Relative %: 56.1 % (ref 43.0–77.0)
Platelets: 355 10*3/uL (ref 150.0–400.0)
RBC: 4.54 Mil/uL (ref 3.87–5.11)
RDW: 13.5 % (ref 11.5–15.5)
WBC: 7.8 10*3/uL (ref 4.0–10.5)

## 2017-06-14 LAB — COMPREHENSIVE METABOLIC PANEL
ALBUMIN: 4.8 g/dL (ref 3.5–5.2)
ALK PHOS: 96 U/L (ref 39–117)
ALT: 20 U/L (ref 0–35)
AST: 20 U/L (ref 0–37)
BILIRUBIN TOTAL: 0.2 mg/dL (ref 0.2–1.2)
BUN: 19 mg/dL (ref 6–23)
CO2: 29 mEq/L (ref 19–32)
CREATININE: 0.66 mg/dL (ref 0.40–1.20)
Calcium: 10.2 mg/dL (ref 8.4–10.5)
Chloride: 103 mEq/L (ref 96–112)
GFR: 94.99 mL/min (ref >60.00)
Glucose, Bld: 122 mg/dL — ABNORMAL HIGH (ref 70–99)
POTASSIUM: 4 meq/L (ref 3.5–5.1)
SODIUM: 140 meq/L (ref 135–145)
TOTAL PROTEIN: 8 g/dL (ref 6.0–8.3)

## 2017-06-14 LAB — VITAMIN D 25 HYDROXY (VIT D DEFICIENCY, FRACTURES): VITD: 31.3 ng/mL (ref 30.00–100.00)

## 2017-06-14 LAB — HEMOGLOBIN A1C: Hgb A1c MFr Bld: 6.4 % (ref 4.6–6.5)

## 2017-06-14 LAB — TSH: TSH: 3.76 u[IU]/mL (ref 0.35–4.50)

## 2017-06-14 LAB — VITAMIN B12: VITAMIN B 12: 412 pg/mL (ref 211–911)

## 2017-06-14 MED ORDER — CICLOPIROX 8 % EX SOLN: Freq: Every day | CUTANEOUS | 0 refills | Status: DC

## 2017-06-14 NOTE — Progress Notes (Signed)
Subjective:    Patient ID: Janet Peters, female    DOB: [birth year], 67 y.o.   MRN: [id number]  HPI  Pt in for follow up.  Pt has diabetes, hyperlipidemia and diabetes.  Pt is eating healthy. Pt did go to nutritionist. Pt is going to the gym 4 days a weak.  Occasional mild fatigue.She want to get b vitamin levels.  Also request to recheck her vitamin D level.  Pt does have some knee pain recently and hx of knee surgery.    Review of Systems  Constitutional: Negative for chills, diaphoresis and fatigue.  Respiratory: Negative for cough, chest tightness, shortness of breath and wheezing.   Cardiovascular: Negative for chest pain and palpitations.       Last night transient. Last 3 seconds and then went away. Not other asociated sigsn or symptoms.  Gastrointestinal: Negative for abdominal pain, blood in stool, diarrhea, nausea and vomiting.  Genitourinary: Negative for difficulty urinating, dysuria, frequency, hematuria, urgency, vaginal bleeding and vaginal pain.  Musculoskeletal: Negative for arthralgias, back pain, joint swelling, neck pain and neck stiffness.  Skin: Negative for rash.  Neurological: Negative for dizziness, seizures, numbness and headaches.  Hematological: Negative for adenopathy. Does not bruise/bleed easily.  Psychiatric/Behavioral: Negative for behavioral problems, confusion and suicidal ideas. The patient is not nervous/anxious.     Past Medical History:  Diagnosis Date  . Diabetes mellitus without complication ([place])   . Hyperlipidemia   . Osteopenia 10/2016   T score -1.3 FRAX 3.5%/0.1%     Social History   Socioeconomic History  . Marital status: Married    Spouse name: Not on file  . Number of children: Not on file  . Years of education: Not on file  . Highest education level: Not on file  Occupational History  . Not on file  Social Needs  . Financial resource strain: Not on file  . Food insecurity:    Worry: Not on file   Inability: Not on file  . Transportation needs:    Medical: Not on file    Non-medical: Not on file  Tobacco Use  . Smoking status: Never Smoker  . Smokeless tobacco: Never Used  Substance and Sexual Activity  . Alcohol use: No  . Drug use: No  . Sexual activity: Yes    Partners: Male    Comment: 1st intercourse- 17, partners - 2   Lifestyle  . Physical activity:    Days per week: Not on file    Minutes per session: Not on file  . Stress: Not on file  Relationships  . Social connections:    Talks on phone: Not on file    Gets together: Not on file    Attends religious service: Not on file    Active member of club or organization: Not on file    Attends meetings of clubs or organizations: Not on file    Relationship status: Not on file  . Intimate partner violence:    Fear of current or ex partner: Not on file    Emotionally abused: Not on file    Physically abused: Not on file    Forced sexual activity: Not on file  Other Topics Concern  . Not on file  Social History Narrative  . Not on file    Past Surgical History:  Procedure Laterality Date  . ABDOMINAL HYSTERECTOMY    . CESAREAN SECTION     x2  . CHOLECYSTECTOMY, LAPAROSCOPIC    . KNEE SURGERY  Left 2011    Family History  Problem Relation Age of Onset  . Heart attack Maternal Grandmother     No Known Allergies  Current Outpatient Medications on File Prior to Visit  Medication Sig Dispense Refill  . atorvastatin (LIPITOR) 10 MG tablet TAKE 1 TABLET(10 MG) BY MOUTH DAILY 90 tablet 3  . atorvastatin (LIPITOR) 10 MG tablet TAKE 1 TABLET(10 MG) BY MOUTH DAILY 30 tablet 0  . Cholecalciferol (VITAMIN D3) 5000 units CAPS Take by mouth.    Marland Kitchen CINNAMON PO Take by mouth.    . diclofenac (VOLTAREN) 75 MG EC tablet Take 1 tablet (75 mg total) by mouth 2 (two) times daily. 30 tablet 0  . fluticasone (FLONASE) 50 MCG/ACT nasal spray Place 2 sprays into both nostrils daily. 16 g 1  . Lorcaserin HCl 10 MG TABS 10 mg  twice daily 60 tablet 2  . Magnesium 400 MG CAPS Take 1 capsule by mouth daily.    . metFORMIN (GLUCOPHAGE) 500 MG tablet TAKE 1 1/2 TABLETS BY MOUTH DAILY WITH BREAKFAST 135 tablet 1   No current facility-administered medications on file prior to visit.     BP 132/76   Pulse 88   Temp 98 F (36.7 C) (Oral)   Resp 16   Ht 5\' 2"  (1.575 m)   Wt 172 lb 3.2 oz (78.1 kg)   SpO2 99%   BMI 31.50 kg/m       Objective:   Physical Exam  General Mental Status- Alert. General Appearance- Not in acute distress.   Skin General: Color- Normal Color. Moisture- Normal Moisture.  Neck Carotid Arteries- Normal color. Moisture- Normal Moisture. No carotid bruits. No JVD.  Chest and Lung Exam Auscultation: Breath Sounds:-Normal.  Cardiovascular Auscultation:Rythm- Regular. Murmurs & Other Heart Sounds:Auscultation of the heart reveals- No Murmurs.  Abdomen Inspection:-Inspeection Normal. Palpation/Percussion:Note:No mass. Palpation and Percussion of the abdomen reveal- Non Tender, Non Distended + BS, no rebound or guarding.    Neurologic Cranial Nerve exam:- CN III-XII intact(No nystagmus), symmetric smile. Strength:- 5/5 equal and symmetric strength both upper and lower extremities.  Foot exam- see quality metrics.      Assessment & Plan:  For your history of mild diabetes, will get Z3G and metabolic panel today.  For history of high cholesterol, will see if lipids are still high without being on your medication.  Will recheck your vitamin D level for history of low level.  Also for recent fatigue will check TSH, B12 and B1 level.  You do have some fungus on her toenails and I did write for Penlac.  Use as directed.  You have very transient atypical left upper chest pain that lasted for seconds.  No associated heart like signs or symptoms.  Your EKG disease in the past did not reveal any ischemia type findings.  Your echo also looked good in the past.  EKG done today as  well.  If you do have any recurrent chest pain that persist then would recommend ED evaluation.  Also if you do have recurrent chest pain intermittently let us know as would refer you for cardiologist evaluation and probable stress test.  Follow-up date to be determined after lab review.  After hours tried to add uric acid to patient's blood work since she did not want to know the level.  Did associate that with left knee pain.  Will send message to [place] and lab to see if that can be added.  Mackie Pai, PA-C

## 2017-06-14 NOTE — Patient Instructions (Addendum)
For your history of mild diabetes, will get E1R and metabolic panel today.  For history of high cholesterol, will see if lipids are still high without being on your medication.  Will recheck your vitamin D level for history of low level.  Also for recent fatigue will check TSH, B12 and B1 level.  You do have some fungus on her toenails and I did write for Penlac.  Use as directed.  You have very transient atypical left upper chest pain that lasted for seconds.  No associated heart like signs or symptoms.  Your EKG disease in the past did not reveal any ischemia type findings.  Your echo also looked good in the past.  EKG done today as well.  If you do have any recurrent chest pain that persist then would recommend ED evaluation.  Also if you do have recurrent chest pain intermittently let us know as would refer you for cardiologist evaluation and probable stress test.  Follow-up date to be determined after lab review.

## 2017-06-15 ENCOUNTER — Other Ambulatory Visit: Payer: Self-pay | Admitting: Medical

## 2017-06-15 ENCOUNTER — Other Ambulatory Visit (INDEPENDENT_AMBULATORY_CARE_PROVIDER_SITE_OTHER): Payer: Medicare Other

## 2017-06-15 DIAGNOSIS — M25569 Pain in unspecified knee: Secondary | ICD-10-CM | POA: Diagnosis not present

## 2017-06-15 LAB — URIC ACID: Uric Acid, Serum: 6.1 mg/dL (ref 2.4–7.0)

## 2017-06-15 NOTE — Addendum Note (Signed)
Addended by: Caffie Pinto on: 06/15/2017 07:39 AM   Modules accepted: Orders

## 2017-06-18 ENCOUNTER — Other Ambulatory Visit (INDEPENDENT_AMBULATORY_CARE_PROVIDER_SITE_OTHER): Payer: Medicare Other

## 2017-06-18 ENCOUNTER — Other Ambulatory Visit: Payer: Self-pay

## 2017-06-18 DIAGNOSIS — E785 Hyperlipidemia, unspecified: Secondary | ICD-10-CM

## 2017-06-18 DIAGNOSIS — E782 Mixed hyperlipidemia: Secondary | ICD-10-CM

## 2017-06-18 LAB — LIPASE: LIPASE: 13 U/L (ref 11.0–59.0)

## 2017-06-18 LAB — VITAMIN B1: VITAMIN B1 (THIAMINE): 8 nmol/L (ref 8–30)

## 2017-06-22 ENCOUNTER — Other Ambulatory Visit (INDEPENDENT_AMBULATORY_CARE_PROVIDER_SITE_OTHER): Payer: Medicare Other

## 2017-06-22 DIAGNOSIS — E785 Hyperlipidemia, unspecified: Secondary | ICD-10-CM | POA: Diagnosis not present

## 2017-06-22 LAB — LIPID PANEL
CHOL/HDL RATIO: 4
CHOLESTEROL: 196 mg/dL (ref 0–200)
HDL: 46.4 mg/dL (ref >39.00)
LDL CALC: 112 mg/dL — AB (ref 0–99)
NonHDL: 149.65
Triglycerides: 189 mg/dL — ABNORMAL HIGH (ref 0.0–149.0)
VLDL: 37.8 mg/dL (ref 0.0–40.0)

## 2017-09-11 ENCOUNTER — Encounter: Payer: Medicare Other | Admitting: Medical

## 2017-09-14 ENCOUNTER — Other Ambulatory Visit: Payer: Self-pay | Admitting: Medical

## 2017-09-14 DIAGNOSIS — E118 Type 2 diabetes mellitus with unspecified complications: Secondary | ICD-10-CM

## 2017-09-14 DIAGNOSIS — Z794 Long term (current) use of insulin: Principal | ICD-10-CM

## 2017-09-14 MED ORDER — ACCU-CHEK COMPACT PLUS CONTROL VI SOLN: 1.0000 | Freq: Every day | 1 refills | Status: DC | PRN

## 2017-09-24 ENCOUNTER — Other Ambulatory Visit: Payer: Self-pay | Admitting: Medical

## 2017-09-26 ENCOUNTER — Encounter: Payer: Self-pay | Admitting: Medical

## 2017-09-26 ENCOUNTER — Ambulatory Visit (INDEPENDENT_AMBULATORY_CARE_PROVIDER_SITE_OTHER): Payer: Medicare Other | Admitting: Medical

## 2017-09-26 VITALS — BP 120/72 | HR 104 | Temp 98.1°F | Resp 16 | Ht 62.0 in | Wt 169.2 lb

## 2017-09-26 DIAGNOSIS — R109 Unspecified abdominal pain: Secondary | ICD-10-CM | POA: Diagnosis not present

## 2017-09-26 DIAGNOSIS — R319 Hematuria, unspecified: Secondary | ICD-10-CM | POA: Diagnosis not present

## 2017-09-26 DIAGNOSIS — E785 Hyperlipidemia, unspecified: Secondary | ICD-10-CM

## 2017-09-26 DIAGNOSIS — R8290 Unspecified abnormal findings in urine: Secondary | ICD-10-CM

## 2017-09-26 DIAGNOSIS — R002 Palpitations: Secondary | ICD-10-CM

## 2017-09-26 DIAGNOSIS — E119 Type 2 diabetes mellitus without complications: Secondary | ICD-10-CM

## 2017-09-26 DIAGNOSIS — R7989 Other specified abnormal findings of blood chemistry: Secondary | ICD-10-CM | POA: Diagnosis not present

## 2017-09-26 DIAGNOSIS — L309 Dermatitis, unspecified: Secondary | ICD-10-CM

## 2017-09-26 LAB — CBC WITH DIFFERENTIAL/PLATELET
BASOS ABS: 0 10*3/uL (ref 0.0–0.1)
BASOS PCT: 0.4 % (ref 0.0–3.0)
Eosinophils Absolute: 0.2 10*3/uL (ref 0.0–0.7)
Eosinophils Relative: 2.6 % (ref 0.0–5.0)
HEMATOCRIT: 40.2 % (ref 36.0–46.0)
HEMOGLOBIN: 13.6 g/dL (ref 12.0–15.0)
LYMPHS PCT: 35.6 % (ref 12.0–46.0)
Lymphs Abs: 3 10*3/uL (ref 0.7–4.0)
MCHC: 33.8 g/dL (ref 30.0–36.0)
MCV: 88 fl (ref 78.0–100.0)
MONOS PCT: 5.5 % (ref 3.0–12.0)
Monocytes Absolute: 0.5 10*3/uL (ref 0.1–1.0)
NEUTROS ABS: 4.7 10*3/uL (ref 1.4–7.7)
Neutrophils Relative %: 55.9 % (ref 43.0–77.0)
PLATELETS: 373 10*3/uL (ref 150.0–400.0)
RBC: 4.57 Mil/uL (ref 3.87–5.11)
RDW: 13.3 % (ref 11.5–15.5)
WBC: 8.4 10*3/uL (ref 4.0–10.5)

## 2017-09-26 LAB — POC URINALSYSI DIPSTICK (AUTOMATED)
BILIRUBIN UA: NEGATIVE
GLUCOSE UA: NEGATIVE
Ketones, UA: NEGATIVE
Leukocytes, UA: NEGATIVE
NITRITE UA: NEGATIVE
Protein, UA: NEGATIVE
Spec Grav, UA: 1.025 (ref 1.010–1.025)
UROBILINOGEN UA: 0.2 U/dL
pH, UA: 6 (ref 5.0–8.0)

## 2017-09-26 LAB — VITAMIN D 25 HYDROXY (VIT D DEFICIENCY, FRACTURES): VITD: 24.06 ng/mL — ABNORMAL LOW (ref 30.00–100.00)

## 2017-09-26 LAB — COMPREHENSIVE METABOLIC PANEL
ALK PHOS: 92 U/L (ref 39–117)
ALT: 18 U/L (ref 0–35)
AST: 14 U/L (ref 0–37)
Albumin: 4.2 g/dL (ref 3.5–5.2)
BILIRUBIN TOTAL: 0.3 mg/dL (ref 0.2–1.2)
BUN: 16 mg/dL (ref 6–23)
CO2: 26 mEq/L (ref 19–32)
Calcium: 9.5 mg/dL (ref 8.4–10.5)
Chloride: 104 mEq/L (ref 96–112)
Creatinine, Ser: 0.6 mg/dL (ref 0.40–1.20)
GFR: 105.94 mL/min (ref >60.00)
Glucose, Bld: 109 mg/dL — ABNORMAL HIGH (ref 70–99)
POTASSIUM: 4.3 meq/L (ref 3.5–5.1)
Sodium: 141 mEq/L (ref 135–145)
TOTAL PROTEIN: 7.3 g/dL (ref 6.0–8.3)

## 2017-09-26 LAB — LIPID PANEL
CHOL/HDL RATIO: 5
CHOLESTEROL: 228 mg/dL — AB (ref 0–200)
HDL: 46.4 mg/dL (ref >39.00)
NonHDL: 181.5
TRIGLYCERIDES: 248 mg/dL — AB (ref 0.0–149.0)
VLDL: 49.6 mg/dL — ABNORMAL HIGH (ref 0.0–40.0)

## 2017-09-26 LAB — LIPASE: Lipase: 11 U/L (ref 11.0–59.0)

## 2017-09-26 LAB — LDL CHOLESTEROL, DIRECT: Direct LDL: 141 mg/dL

## 2017-09-26 LAB — AMYLASE: AMYLASE: 40 U/L (ref 27–131)

## 2017-09-26 LAB — HEMOGLOBIN A1C: HEMOGLOBIN A1C: 6.4 % (ref 4.6–6.5)

## 2017-09-26 MED ORDER — ATORVASTATIN CALCIUM 10 MG PO TABS
ORAL_TABLET | ORAL | 3 refills | Status: DC
Start: 2017-09-26 — End: 2017-10-03

## 2017-09-26 MED ORDER — TRIAMCINOLONE ACETONIDE 0.1 % EX CREA: 1.0000 "application " | TOPICAL_CREAM | Freq: Two times a day (BID) | CUTANEOUS | 1 refills | Status: DC

## 2017-09-26 NOTE — Patient Instructions (Addendum)
For your history of high cholesterol, I do want you to start Lipitor.  This will reduce cardiovascular risk.  For diabetes we will get A1c today.  Continue metformin and might need to adjust treatment if A1c is elevated.  For low vitamin D we will check level today.  Based on your history you might need to be on daily low-dose vitamin D.  For history of occasional upset stomach, I am ordering a CBC, CMP, amylase and lipase.  Also will get a UA.  For occasional upset stomach can try Zantac over-the-counter.  But if pain is more constant and daily please let me know.  Further studies may be needed.  Eat healthy diet.  For history of palpitations, we did EKG today(sinus rhythm but appeared to show rt bundle branch block. No change when compared to ekg done in [place]).  Also go ahead and refer you to cardiologist as the medical doctor in [place] advise you to get Holter monitor.  Please stop any caffeine use.  For intermittent dermatitis to forearms and rash, I did refill triamcinolone.  Cautioned to only use sporadically when needed.  Keep skin well moisturized.   Follow-up in 3 months or as needed.

## 2017-09-26 NOTE — Progress Notes (Signed)
Subjective:    Patient ID: Janet Peters, female    DOB: [birth year], 67 y.o.   MRN: [id number]  HPI  Pt in for follow up.  Pt states she did visit her family in [place]. She really does like to be close to family.  Pt states not taking her lipitor medication. No side effects reported.    The 10-year ASCVD risk score Mikey Bussing DC Brooke Bonito., et al., 2013) is: 12%   Values used to calculate the score:     Age: 68 years     Sex: Female     Is Non-Hispanic African American: No     Diabetic: Yes     Tobacco smoker: No     Systolic Blood Pressure: 326 mmHg     Is BP treated: No     HDL Cholesterol: 46.4 mg/dL     Total Cholesterol: 196 mg/dL  Pt made aware of the above. She is fasting today. She expresses that will take lipitor daily basis.   Hx of low vitamin D. Will recheck today.  Pt sugar level have been borderline. A!C at 6.5.  Occasional upset stomach after eating and sometimes with eating. Not daily or constant. Not presently. Rare. She does want pancrease enzymes checked.   Review of Systems  Constitutional: Negative for chills, fatigue and fever.  Respiratory: Negative for cough, chest tightness, shortness of breath and wheezing.   Cardiovascular: Positive for palpitations. Negative for chest pain.       Pt has history of palpitation this past week. Last felt yesterday for 3 seconds then resolved. But every day this past brief palpitation. Mid June in [place] did ekg and told get holter.   Pt drinks 2-3 cups coffee.   Gastrointestinal: Negative for abdominal distention, abdominal pain, constipation, diarrhea, nausea and vomiting.       See hpi.  Musculoskeletal: Negative for back pain, joint swelling and neck stiffness.  Skin: Negative for rash.       History of dermatitis to forearms.  Prior doctors have worked this up and no cause determined per patient.  But rash did respond to triamcinolone.  She does not use this daily but only intermittently.  I refilled  this today.  Neurological: Negative for dizziness and headaches.  Hematological: Negative for adenopathy. Does not bruise/bleed easily.  Psychiatric/Behavioral: Negative for behavioral problems, confusion and suicidal ideas. The patient is not nervous/anxious.        Objective:   Physical Exam  General Mental Status- Alert. General Appearance- Not in acute distress.   Skin General: Color- Normal Color. Moisture- Normal Moisture.  Neck Carotid Arteries- Normal color. Moisture- Normal Moisture. No carotid bruits. No JVD.  Chest and Lung Exam Auscultation: Breath Sounds:-Normal.  Cardiovascular Auscultation:Rythm- Regular. Murmurs & Other Heart Sounds:Auscultation of the heart reveals- No Murmurs.  Abdomen Inspection:-Inspeection Normal. Palpation/Percussion:Note:No mass. Palpation and Percussion of the abdomen reveal- Non Tender, Non Distended + BS, no rebound or guarding.    Neurologic Cranial Nerve exam:- CN III-XII intact(No nystagmus), symmetric smile. Strength:- 5/5 equal and symmetric strength both upper and lower extremities.      Assessment & Plan:  For your history of high cholesterol, I do want you to start Lipitor.  This will reduce cardiovascular risk.  For diabetes we will get A1c today.  Continue metformin and might need to adjust treatment if A1c is elevated.  For low vitamin D we will check level today.  Based on your history you might need to be on daily  low-dose vitamin D.  For history of occasional upset stomach, I am ordering a CBC, CMP, amylase and lipase.  Also will get a UA.  For occasional upset stomach can try Zantac over-the-counter.  But if pain is more constant and daily please let me know.  Further studies may be needed.  Eat healthy diet.  For history of palpitations, we did EKG today.  Also go ahead and refer you to cardiologist as the medical doctor in [place] advise you to get Holter monitor.  Please stop any caffeine use.  For  intermittent dermatitis to forearms and rash, I did refill triamcinolone.  Cautioned to only use sporadically when needed.  Keep skin well moisturized.  Follow-up in 3 months or as needed.  40 minutes spent with patient today.  50% of time was counseling on her various conditions particularly her hyperlipidemia, diabetes and recent palpitations.  Mackie Pai, PA-C

## 2017-09-27 ENCOUNTER — Telehealth: Payer: Self-pay | Admitting: Medical

## 2017-09-27 DIAGNOSIS — R319 Hematuria, unspecified: Secondary | ICD-10-CM

## 2017-09-27 LAB — URINE CULTURE
MICRO NUMBER:: 90793002
SPECIMEN QUALITY:: ADEQUATE

## 2017-09-27 NOTE — Telephone Encounter (Signed)
Future urine order placed. °

## 2017-10-03 ENCOUNTER — Other Ambulatory Visit (INDEPENDENT_AMBULATORY_CARE_PROVIDER_SITE_OTHER): Payer: Medicare Other

## 2017-10-03 ENCOUNTER — Encounter: Payer: Self-pay | Admitting: Cardiology

## 2017-10-03 ENCOUNTER — Ambulatory Visit (INDEPENDENT_AMBULATORY_CARE_PROVIDER_SITE_OTHER): Payer: Medicare Other | Admitting: Cardiology

## 2017-10-03 ENCOUNTER — Encounter: Payer: Self-pay | Admitting: *Deleted

## 2017-10-03 VITALS — BP 120/64 | HR 115 | Ht 62.0 in | Wt 164.8 lb

## 2017-10-03 DIAGNOSIS — R319 Hematuria, unspecified: Secondary | ICD-10-CM

## 2017-10-03 DIAGNOSIS — E782 Mixed hyperlipidemia: Secondary | ICD-10-CM | POA: Diagnosis not present

## 2017-10-03 DIAGNOSIS — E088 Diabetes mellitus due to underlying condition with unspecified complications: Secondary | ICD-10-CM | POA: Insufficient documentation

## 2017-10-03 DIAGNOSIS — R079 Chest pain, unspecified: Secondary | ICD-10-CM | POA: Diagnosis not present

## 2017-10-03 MED ORDER — METOPROLOL SUCCINATE 25 MG PO CS24: 1.0000 | EXTENDED_RELEASE_CAPSULE | Freq: Every day | ORAL | 6 refills | Status: DC

## 2017-10-03 NOTE — Progress Notes (Signed)
Cardiology Office Note:    Date:  10/03/2017   ID:  Janet Peters, DOB 1951-03-23, MRN [id number]  PCP:  Mackie Pai, PA-C  Cardiologist:  Jenean Lindau, MD   Referring MD: Mackie Pai, PA-C    ASSESSMENT:    1. Chest pain, unspecified type   2. Mixed hyperlipidemia    PLAN:    In order of problems listed above:  1. Primary prevention stressed with the patient.  Importance of compliance with diet and medication stressed and she vocalized understanding.  Her blood pressure is stable.  Diet was discussed for dyslipidemia and diabetes mellitus.  Weight reduction was stressed. 2. In view of her above symptoms I would like to get her Lexiscan sestamibi.  Her resting tachycardia makes her not a great candidate for exercise stress testing.  I will give her a beta-blocker metoprolol XL 25 mg daily. 3. She will be seen in follow-up appointment in 3 months or earlier if she has any concerns.  Weight reduction was urged and diet was discussed.  She knows to go to the nearest emergency room for any significant concerns.   Medication Adjustments/Labs and Tests Ordered: Current medicines are reviewed at length with the patient today.  Concerns regarding medicines are outlined above.  Orders Placed This Encounter  Procedures  . MYOCARDIAL PERFUSION IMAGING   Meds ordered this encounter  Medications  . Metoprolol Succinate 25 MG CS24    Sig: Take 1 tablet by mouth daily.    Dispense:  30 capsule    Refill:  6     History of Present Illness:    Janet Peters is a 67 y.o. female who is being seen today for the evaluation of chest pain at the request of S[place], Percell Miller, [place].  Patient is a pleasant 67 year old female.  She has past medical history of dyslipidemia and diabetes mellitus.  She mentions to me that she has substernal chest discomfort at times.  No radiation to the neck or to the arms.  She walks on a regular basis and walking does not bring about the  symptoms.  She is concerned about it because she is overweight and has diabetes.  She is worried that this may be cardiac related symptoms.  She has resting tachycardia and her TSH has been largely unremarkable.  Her husband accompanies her for the visit.  He is very supportive.  At the time of my evaluation, the patient is alert awake oriented and in no distress.  Past Medical History:  Diagnosis Date  . Diabetes mellitus without complication ([place])   . Hyperlipidemia   . Osteopenia 10/2016   T score -1.3 FRAX 3.5%/0.1%    Past Surgical History:  Procedure Laterality Date  . ABDOMINAL HYSTERECTOMY    . CESAREAN SECTION     x2  . CHOLECYSTECTOMY, LAPAROSCOPIC    . KNEE SURGERY Left 2011    Current Medications: Current Meds  Medication Sig  . ACCU-CHEK AVIVA PLUS test strip TEST TWICE DAILY  . atorvastatin (LIPITOR) 10 MG tablet TAKE 1 TABLET(10 MG) BY MOUTH DAILY  . Blood Glucose Calibration (ACCU-CHEK COMPACT PLUS CONTROL) SOLN 1 each by In Vitro route daily as needed.  . Cholecalciferol (VITAMIN D3) 5000 units CAPS Take by mouth.  Marland Kitchen CINNAMON PO Take by mouth.  . fluticasone (FLONASE) 50 MCG/ACT nasal spray Place 2 sprays into both nostrils daily.  . metFORMIN (GLUCOPHAGE) 500 MG tablet TAKE 1 AND 1/2 TABLETS BY MOUTH DAILY WITH BREAKFAST  . triamcinolone  cream (KENALOG) 0.1 % Apply 1 application topically 2 (two) times daily.     Allergies:   Patient has no known allergies.   Social History   Socioeconomic History  . Marital status: Married    Spouse name: Not on file  . Number of children: Not on file  . Years of education: Not on file  . Highest education level: Not on file  Occupational History  . Not on file  Social Needs  . Financial resource strain: Not on file  . Food insecurity:    Worry: Not on file    Inability: Not on file  . Transportation needs:    Medical: Not on file    Non-medical: Not on file  Tobacco Use  . Smoking status: Never Smoker  .  Smokeless tobacco: Never Used  Substance and Sexual Activity  . Alcohol use: No  . Drug use: No  . Sexual activity: Yes    Partners: Male    Comment: 1st intercourse- 53, partners - 2   Lifestyle  . Physical activity:    Days per week: Not on file    Minutes per session: Not on file  . Stress: Not on file  Relationships  . Social connections:    Talks on phone: Not on file    Gets together: Not on file    Attends religious service: Not on file    Active member of club or organization: Not on file    Attends meetings of clubs or organizations: Not on file    Relationship status: Not on file  Other Topics Concern  . Not on file  Social History Narrative  . Not on file     Family History: The patient's family history includes Heart attack in her maternal grandmother.  ROS:   Please see the history of present illness.    All other systems reviewed and are negative.  EKGs/Labs/Other Studies Reviewed:    The following studies were reviewed today: EKG reveals sinus rhythm and nonspecific ST-T changes.   Recent Labs: 06/14/2017: TSH 3.76 09/26/2017: ALT 18; BUN 16; Creatinine, Ser 0.60; Hemoglobin 13.6; Platelets 373.0; Potassium 4.3; Sodium 141  Recent Lipid Panel    Component Value Date/Time   CHOL 228 (H) 09/26/2017 0924   TRIG 248.0 (H) 09/26/2017 0924   HDL 46.40 09/26/2017 0924   CHOLHDL 5 09/26/2017 0924   VLDL 49.6 (H) 09/26/2017 0924   LDLCALC 112 (H) 06/22/2017 0822   LDLDIRECT 141.0 09/26/2017 0924    Physical Exam:    VS:  BP 120/64   Pulse (!) 115   Ht 5\' 2"  (1.575 m)   Wt 164 lb 12.8 oz (74.8 kg)   SpO2 98%   BMI 30.14 kg/m     Wt Readings from Last 3 Encounters:  10/03/17 164 lb 12.8 oz (74.8 kg)  09/26/17 169 lb 3.2 oz (76.7 kg)  06/14/17 172 lb 3.2 oz (78.1 kg)     GEN: Patient is in no acute distress HEENT: Normal NECK: No JVD; No carotid bruits LYMPHATICS: No lymphadenopathy CARDIAC: S1 S2 regular, 2/6 systolic murmur at the  apex. RESPIRATORY:  Clear to auscultation without rales, wheezing or rhonchi  ABDOMEN: Soft, non-tender, non-distended MUSCULOSKELETAL:  No edema; No deformity  SKIN: Warm and dry NEUROLOGIC:  Alert and oriented x 3 PSYCHIATRIC:  Normal affect    Signed, Jenean Lindau, MD  10/03/2017 4:56 PM    [facility]

## 2017-10-03 NOTE — Patient Instructions (Signed)
Medication Instructions:  Your physician has recommended you make the following change in your medication:  START metoprolol succinate 25 mg daily   Labwork: None  Testing/Procedures: Your physician has requested that you have a lexiscan myoview. For further information please visit [email or web address]. Please follow instruction sheet, as given.  Follow-Up: Your physician wants you to follow-up in: 3 months. You will receive a reminder letter in the mail two months in advance. If you don't receive a letter, please call our office to schedule the follow-up appointment.   If you need a refill on your cardiac medications before your next appointment, please call your pharmacy.   Thank you for choosing [facility]! Robyne Peers, RN [phone]   Metoprolol extended-release tablets What is this medicine? METOPROLOL (me TOE proe lole) is a beta-blocker. Beta-blockers reduce the workload on the heart and help it to beat more regularly. This medicine is used to treat high blood pressure and to prevent chest pain. It is also used to after a heart attack and to prevent an additional heart attack from occurring. This medicine may be used for other purposes; ask your health care provider or pharmacist if you have questions. COMMON BRAND NAME(S): toprol, Toprol XL What should I tell my health care provider before I take this medicine? They need to know if you have any of these conditions: -diabetes -heart or vessel disease like slow heart rate, worsening heart failure, heart block, sick sinus syndrome or Raynaud's disease -kidney disease -liver disease -lung or breathing disease, like asthma or emphysema -pheochromocytoma -thyroid disease -an unusual or allergic reaction to metoprolol, other beta-blockers, medicines, foods, dyes, or preservatives -pregnant or trying to get pregnant -breast-feeding How should I use this medicine? Take this medicine by mouth with a glass of water.  Follow the directions on the prescription label. Do not crush or chew. Take this medicine with or immediately after meals. Take your doses at regular intervals. Do not take more medicine than directed. Do not stop taking this medicine suddenly. This could lead to serious heart-related effects. Talk to your pediatrician regarding the use of this medicine in children. While this drug may be prescribed for children as young as 6 years for selected conditions, precautions do apply. Overdosage: If you think you have taken too much of this medicine contact a poison control center or emergency room at once. NOTE: This medicine is only for you. Do not share this medicine with others. What if I miss a dose? If you miss a dose, take it as soon as you can. If it is almost time for your next dose, take only that dose. Do not take double or extra doses. What may interact with this medicine? This medicine may interact with the following medications: -certain medicines for blood pressure, heart disease, irregular heart beat -certain medicines for depression, like monoamine oxidase (MAO) inhibitors, fluoxetine, or paroxetine -clonidine -dobutamine -epinephrine -isoproterenol -reserpine This list may not describe all possible interactions. Give your health care provider a list of all the medicines, herbs, non-prescription drugs, or dietary supplements you use. Also tell them if you smoke, drink alcohol, or use illegal drugs. Some items may interact with your medicine. What should I watch for while using this medicine? Visit your doctor or health care professional for regular check ups. Contact your doctor right away if your symptoms worsen. Check your blood pressure and pulse rate regularly. Ask your health care professional what your blood pressure and pulse rate should be, and when you should contact  them. You may get drowsy or dizzy. Do not drive, use machinery, or do anything that needs mental alertness until  you know how this medicine affects you. Do not sit or stand up quickly, especially if you are an older patient. This reduces the risk of dizzy or fainting spells. Contact your doctor if these symptoms continue. Alcohol may interfere with the effect of this medicine. Avoid alcoholic drinks. What side effects may I notice from receiving this medicine? Side effects that you should report to your doctor or health care professional as soon as possible: -allergic reactions like skin rash, itching or hives -cold or numb hands or feet -depression -difficulty breathing -faint -fever with sore throat -irregular heartbeat, chest pain -rapid weight gain -swollen legs or ankles Side effects that usually do not require medical attention (report to your doctor or health care professional if they continue or are bothersome): -anxiety or nervousness -change in sex drive or performance -dry skin -headache -nightmares or trouble sleeping -short term memory loss -stomach upset or diarrhea -unusually tired This list may not describe all possible side effects. Call your doctor for medical advice about side effects. You may report side effects to FDA at 1-800-FDA-1088. Where should I keep my medicine? Keep out of the reach of children. Store at room temperature between 15 and 30 degrees C (59 and 86 degrees F). Throw away any unused medicine after the expiration date. NOTE: This sheet is a summary. It may not cover all possible information. If you have questions about this medicine, talk to your doctor, pharmacist, or health care provider.  2018 Elsevier/Gold Standard (2012-11-15 14:41:37)    Nuclear Medicine Exam A nuclear medicine exam is a safe and painless imaging test. It helps your health care provider detect and diagnose disease in the body. It also provides information about the way your organs work and how they are structured. For a nuclear medicine exam, you will be given a radioactive tracer.  This substance is absorbed by your body's organs. A large scanning machine detects the tracer and creates pictures of the areas that your health care provider wants to look at. There are several kinds of nuclear medicine exams. They include:  CT scan.  MRI.  PET scan.  Tell a health care provider about:  Any allergies you have.  All medicines you are taking, including vitamins, herbs, eye drops, creams, and over-the-counter medicines.  Any problems you or family members have had with anesthetic medicines.  Any blood disorders you have.  Any surgeries you have had.  Any medical conditions you have.  Whether you are pregnant or may be pregnant.  Whether you are nursing. What happens before the procedure?  Ask your health care provider about changing or stopping your regular medicines.  Follow instructions from your health care provider about eating or drinking restrictions.  Do not wear jewelry.  Wear loose, comfortable clothing. You may be asked to wear a hospital gown for the procedure.  Bring previous imaging studies, such as X-rays, with you to the exam if they are available. What happens during the procedure?  An IV tube may be inserted into one of your veins.  You will be asked to lie on a table or sit in a chair.  You will be given the radioactive tracer. You may get: ? A pill or liquid to swallow. ? An injection. ? Medicine through your IV tube. ? A gas to inhale.  A large scanning machine will be used to create images  of your body. After the pictures are taken, you may have to wait so your health care provider can make sure that enough good images were taken. The procedure may vary among health care providers and hospitals. What happens after the procedure?  You may go right home after the procedure and return to your usual activities, unless told otherwise by your health care provider.  Drink enough water to keep urine clear or pale yellow. This helps to  flush the radioactive tracer out of your body.  It is your responsibility to get your test results. Ask your health care provider or the department performing the test when your results will be ready.  Seek immediate medical care if you have shortness of breath. This information is not intended to replace advice given to you by your health care provider. Make sure you discuss any questions you have with your health care provider. Document Released: 04/20/2004 Document Revised: 11/11/2015 Document Reviewed: 09/30/2014 Elsevier Interactive Patient Education  [employer].

## 2017-10-04 ENCOUNTER — Ambulatory Visit (INDEPENDENT_AMBULATORY_CARE_PROVIDER_SITE_OTHER): Payer: Medicare Other | Admitting: Obstetrics & Gynecology

## 2017-10-04 ENCOUNTER — Encounter: Payer: Self-pay | Admitting: Obstetrics & Gynecology

## 2017-10-04 VITALS — BP 136/78 | Ht 60.0 in | Wt 168.4 lb

## 2017-10-04 DIAGNOSIS — E6609 Other obesity due to excess calories: Secondary | ICD-10-CM

## 2017-10-04 DIAGNOSIS — Z01419 Encounter for gynecological examination (general) (routine) without abnormal findings: Secondary | ICD-10-CM

## 2017-10-04 DIAGNOSIS — Z6832 Body mass index (BMI) 32.0-32.9, adult: Secondary | ICD-10-CM

## 2017-10-04 DIAGNOSIS — M8588 Other specified disorders of bone density and structure, other site: Secondary | ICD-10-CM | POA: Diagnosis not present

## 2017-10-04 DIAGNOSIS — Z780 Asymptomatic menopausal state: Secondary | ICD-10-CM

## 2017-10-04 DIAGNOSIS — Z90710 Acquired absence of both cervix and uterus: Secondary | ICD-10-CM

## 2017-10-04 LAB — URINALYSIS, ROUTINE W REFLEX MICROSCOPIC
Bilirubin Urine: NEGATIVE
Hgb urine dipstick: NEGATIVE
KETONES UR: NEGATIVE
Leukocytes, UA: NEGATIVE
Nitrite: NEGATIVE
RBC / HPF: NONE SEEN (ref 0–?)
Total Protein, Urine: NEGATIVE
UROBILINOGEN UA: 0.2 (ref 0.0–1.0)
Urine Glucose: NEGATIVE
pH: 6 (ref 5.0–8.0)

## 2017-10-04 NOTE — Progress Notes (Signed)
Janet Peters 26-Nov-1950 [id number]   History:    67 y.o. G2P2L2 Married  RP:  Established patient presenting for annual gyn exam   HPI: S/P Total Hysterectomy.  Menopause well on no hormone replacement therapy.  No pelvic pain.  No pain with intercourse.  Normal vaginal secretions.  Urine and bowel movements normal.  Occasional right lower back pain irradiating anteriorly, worse when exercising.  Breasts normal.  Body mass index 32.89.  Goes to the gym 4 times a week doing aerobic and weightlifting physical activities.  Healthy nutrition.  Health labs with family physician.  Past medical history,surgical history, family history and social history were all reviewed and documented in the EPIC chart.  Gynecologic History No LMP recorded. Patient has had a hysterectomy. Contraception: status post hysterectomy Last Pap: 2017. Results were: normal per patient Last mammogram: 02/2017. Results were: Negative Bone Density: 10/2016 Osteopenia at Spine T-Score -1.3 Colonoscopy: 2011  Obstetric History OB History  Gravida Para Term Preterm AB Living  2 2       2   SAB TAB Ectopic Multiple Live Births               # Outcome Date GA Lbr Len/2nd Weight Sex Delivery Anes PTL Lv  2 Para           1 Para              ROS: A ROS was performed and pertinent positives and negatives are included in the history.  GENERAL: No fevers or chills. HEENT: No change in vision, no earache, sore throat or sinus congestion. NECK: No pain or stiffness. CARDIOVASCULAR: No chest pain or pressure. No palpitations. PULMONARY: No shortness of breath, cough or wheeze. GASTROINTESTINAL: No abdominal pain, nausea, vomiting or diarrhea, melena or bright red blood per rectum. GENITOURINARY: No urinary frequency, urgency, hesitancy or dysuria. MUSCULOSKELETAL: No joint or muscle pain, no back pain, no recent trauma. DERMATOLOGIC: No rash, no itching, no lesions. ENDOCRINE: No polyuria, polydipsia, no heat or cold  intolerance. No recent change in weight. HEMATOLOGICAL: No anemia or easy bruising or bleeding. NEUROLOGIC: No headache, seizures, numbness, tingling or weakness. PSYCHIATRIC: No depression, no loss of interest in normal activity or change in sleep pattern.     Exam:   BP 136/78   Ht 5' (1.524 m)   Wt 168 lb 6.4 oz (76.4 kg)   BMI 32.89 kg/m   Body mass index is 32.89 kg/m.  General appearance : Well developed well nourished female. No acute distress HEENT: Eyes: no retinal hemorrhage or exudates,  Neck supple, trachea midline, no carotid bruits, no thyroidmegaly Lungs: Clear to auscultation, no rhonchi or wheezes, or rib retractions  Heart: Regular rate and rhythm, no murmurs or gallops Breast:Examined in sitting and supine position were symmetrical in appearance, no palpable masses or tenderness,  no skin retraction, no nipple inversion, no nipple discharge, no skin discoloration, no axillary or supraclavicular lymphadenopathy Abdomen: no palpable masses or tenderness, no rebound or guarding Extremities: no edema or skin discoloration or tenderness  Pelvic: Vulva: Normal             Vagina: No gross lesions or discharge.  Pap reflex done  Cervix/Uterus absent  Adnexa  Without masses or tenderness  Anus: Normal   Assessment/Plan:  67 y.o. female for annual exam   1. Well female exam with routine gynecological exam Gynecologic exam status post total hysterectomy and menopause.  Pap reflex done on the vaginal vault.  Breast exam normal.  Screening mammogram was negative in December 2018.  Colonoscopy in 2011.  Health labs with family physician.  2. Hx of total hysterectomy  3. Menopause present Well on no hormone replacement therapy.  4. Osteopenia of lumbar spine Mild osteopenia at the spine with a T score of -1.3 in August 2018.  Recommend vitamin D supplements, calcium rich nutrition and regular weightbearing physical activity.  Repeat bone density in August 2020.  5.  Class 1 obesity due to excess calories without serious comorbidity with body mass index (BMI) of 32.0 to 32.9 in adult Continue with regular physical activity, aerobic activities 5 times a week and weightlifting every 2 days.  Per patient, already having a low calorie/low carb diet.  Could try slightly increasing physical activity and mildly decreasing her total calories to change the balance of intake vs output.  Princess Bruins MD, 8:45 AM 10/04/2017

## 2017-10-04 NOTE — Patient Instructions (Signed)
1. Well female exam with routine gynecological exam Gynecologic exam status post total hysterectomy and menopause.  Pap reflex done on the vaginal vault.  Breast exam normal.  Screening mammogram was negative in December 2018.  Colonoscopy in 2011.  Health labs with family physician.  2. Hx of total hysterectomy  3. Menopause present Well on no hormone replacement therapy.  4. Osteopenia of lumbar spine Mild osteopenia at the spine with a T score of -1.3 in August 2018.  Recommend vitamin D supplements, calcium rich nutrition and regular weightbearing physical activity.  Repeat bone density in August 2020.  5. Class 1 obesity due to excess calories without serious comorbidity with body mass index (BMI) of 32.0 to 32.9 in adult Continue with regular physical activity, aerobic activities 5 times a week and weightlifting every 2 days.  Per patient, already having a low calorie/low carb diet.  Could try slightly increasing physical activity and mildly decreasing her total calories to change the balance of intake vs output.  Dakari, fue un placer verle hoy!  Voy a informarle de sus [employer].

## 2017-10-04 NOTE — Addendum Note (Signed)
Addended by: Thurnell Garbe A on: 10/04/2017 09:42 AM   Modules accepted: Orders

## 2017-10-05 LAB — PAP IG W/ RFLX HPV ASCU

## 2017-10-10 ENCOUNTER — Telehealth (HOSPITAL_COMMUNITY): Payer: Self-pay | Admitting: *Deleted

## 2017-10-10 NOTE — Telephone Encounter (Signed)
Attempted to call patient regarding upcoming appointment- no answer.  Janet Peters  

## 2017-10-12 ENCOUNTER — Other Ambulatory Visit: Payer: Self-pay

## 2017-10-12 ENCOUNTER — Ambulatory Visit (HOSPITAL_COMMUNITY): Payer: Medicare Other | Attending: Cardiovascular Disease

## 2017-10-12 ENCOUNTER — Other Ambulatory Visit: Payer: Self-pay | Admitting: Medical

## 2017-10-12 DIAGNOSIS — Z1231 Encounter for screening mammogram for malignant neoplasm of breast: Secondary | ICD-10-CM

## 2017-10-12 DIAGNOSIS — R079 Chest pain, unspecified: Secondary | ICD-10-CM | POA: Insufficient documentation

## 2017-10-12 LAB — MYOCARDIAL PERFUSION IMAGING
CHL CUP NUCLEAR SRS: 7
CHL CUP RESTING HR STRESS: 86 {beats}/min
LHR: 0.35
LV dias vol: 62 mL (ref 46–106)
LV sys vol: 18 mL
NUC STRESS TID: 1.16
Peak HR: 114 {beats}/min
SDS: 2
SSS: 8

## 2017-10-12 MED ORDER — REGADENOSON 0.4 MG/5ML IV SOLN
0.4000 mg | Freq: Once | INTRAVENOUS | Status: AC
Start: 2017-10-12 — End: 2017-10-12
  Administered 2017-10-12: 0.4 mg via INTRAVENOUS

## 2017-10-12 MED ORDER — METOPROLOL SUCCINATE ER 25 MG PO TB24: 25.0000 mg | ORAL_TABLET | Freq: Every day | ORAL | 3 refills | Status: DC

## 2017-10-12 MED ORDER — TECHNETIUM TC 99M TETROFOSMIN IV KIT
10.2000 | PACK | Freq: Once | INTRAVENOUS | Status: AC | PRN
  Administered 2017-10-12: 10.2 via INTRAVENOUS
  Filled 2017-10-12: qty 11

## 2017-10-12 MED ORDER — TECHNETIUM TC 99M TETROFOSMIN IV KIT
31.8000 | PACK | Freq: Once | INTRAVENOUS | Status: AC | PRN
  Administered 2017-10-12: 31.8 via INTRAVENOUS
  Filled 2017-10-12: qty 32

## 2017-12-24 ENCOUNTER — Other Ambulatory Visit: Payer: Self-pay | Admitting: Medical

## 2018-01-01 ENCOUNTER — Encounter: Payer: Self-pay | Admitting: Cardiology

## 2018-01-01 ENCOUNTER — Ambulatory Visit: Payer: Medicare Other | Admitting: Cardiology

## 2018-01-01 ENCOUNTER — Ambulatory Visit (INDEPENDENT_AMBULATORY_CARE_PROVIDER_SITE_OTHER): Payer: Medicare Other | Admitting: Cardiology

## 2018-01-01 VITALS — BP 126/70 | HR 91 | Ht 60.0 in | Wt 171.0 lb

## 2018-01-01 DIAGNOSIS — E782 Mixed hyperlipidemia: Secondary | ICD-10-CM

## 2018-01-01 DIAGNOSIS — E088 Diabetes mellitus due to underlying condition with unspecified complications: Secondary | ICD-10-CM

## 2018-01-01 HISTORY — DX: Diabetes mellitus due to underlying condition with unspecified complications: E08.8

## 2018-01-01 HISTORY — DX: Mixed hyperlipidemia: E78.2

## 2018-01-01 LAB — HEPATIC FUNCTION PANEL
ALBUMIN: 4.4 g/dL (ref 3.6–4.8)
ALK PHOS: 111 IU/L (ref 39–117)
ALT: 28 IU/L (ref 0–32)
AST: 17 IU/L (ref 0–40)
BILIRUBIN TOTAL: 0.3 mg/dL (ref 0.0–1.2)
BILIRUBIN, DIRECT: 0.08 mg/dL (ref 0.00–0.40)
TOTAL PROTEIN: 7.2 g/dL (ref 6.0–8.5)

## 2018-01-01 LAB — LIPID PANEL
CHOLESTEROL TOTAL: 153 mg/dL (ref 100–199)
Chol/HDL Ratio: 2.8 ratio (ref 0.0–4.4)
HDL: 55 mg/dL (ref >39)
LDL CALC: 67 mg/dL (ref 0–99)
TRIGLYCERIDES: 154 mg/dL — AB (ref 0–149)
VLDL Cholesterol Cal: 31 mg/dL (ref 5–40)

## 2018-01-01 LAB — BASIC METABOLIC PANEL
BUN / CREAT RATIO: 19 (ref 12–28)
BUN: 14 mg/dL (ref 8–27)
CO2: 24 mmol/L (ref 20–29)
CREATININE: 0.73 mg/dL (ref 0.57–1.00)
Calcium: 9.6 mg/dL (ref 8.7–10.3)
Chloride: 101 mmol/L (ref 96–106)
GFR calc Af Amer: 99 mL/min/{1.73_m2} (ref >59)
GFR calc non Af Amer: 85 mL/min/{1.73_m2} (ref >59)
GLUCOSE: 106 mg/dL — AB (ref 65–99)
Potassium: 4.4 mmol/L (ref 3.5–5.2)
Sodium: 140 mmol/L (ref 134–144)

## 2018-01-01 MED ORDER — METOPROLOL SUCCINATE ER 50 MG PO TB24: 50.0000 mg | ORAL_TABLET | Freq: Every day | ORAL | 2 refills | Status: DC

## 2018-01-01 NOTE — Patient Instructions (Addendum)
Medication Instructions:  Your physician has recommended you make the following change in your medication:   INCREMENTAR- metoprolol 50 mg cada dia  If you need a refill on your cardiac medications before your next appointment, please call your pharmacy.   Lab work: Laboratorios hoy- BMP, liver and lipid panel  If you have labs (blood work) drawn today and your tests are completely normal, you will receive your results only by: Marland Kitchen MyChart Message (if you have MyChart) OR . A paper copy in the mail If you have any lab test that is abnormal or we need to change your treatment, we will call you to review the results.  Testing/Procedures: [place]  Follow-Up: At [facility], you and your health needs are our priority.  As part of our continuing mission to provide you with exceptional heart care, we have created designated Provider Care Teams.  These Care Teams include your primary Cardiologist (physician) and Advanced Practice Providers (APPs -  Physician Assistants and Nurse Practitioners) who all work together to provide you with the care you need, when you need it. You will need a follow up appointment in 6 meses.  Please call our office 2 months in advance to schedule this appointment.  You may see another member of our [employer] Provider Team in [place]: Jenne Campus, MD . Shirlee More, MD  Any Other Special Instructions Will Be Listed Below (If Applicable).  por favor registre su presin arterial dos veces cada dia durante 1 semana.

## 2018-01-01 NOTE — Progress Notes (Signed)
Cardiology Office Note:    Date:  01/01/2018   ID:  Janet Peters, DOB 18-Jul-1950, MRN [id number]  PCP:  Mackie Pai, PA-C  Cardiologist:  Jenean Lindau, MD   Referring MD: Mackie Pai, PA-C    ASSESSMENT:    1. Mixed dyslipidemia   2. Diabetes mellitus due to underlying condition with unspecified complications ([place])    PLAN:    In order of problems listed above:  1. Primary prevention stressed with the patient.  Importance of compliance with diet and medication stressed and she vocalized understanding.  Her blood pressure is stable.  Diet was discussed for dyslipidemia and diabetes mellitus. 2. Patient is currently on statin and we will check her lipids today as she is fasting.  I told her to walk at least 30 minutes a day at least 5 days a week and she is agreeable.  Diet was discussed with obesity and weight reduction was stressed.  She is concerned about her tachycardia and have increased her Toprol-XL to 50 mg daily.  She will keep a track of her pulse blood pressure and get it back to [place] in 1 to 2 weeks. 3. Patient will be seen in follow-up appointment in 6 months or earlier if the patient has any concerns    Medication Adjustments/Labs and Tests Ordered: Current medicines are reviewed at length with the patient today.  Concerns regarding medicines are outlined above.  No orders of the defined types were placed in this encounter.  No orders of the defined types were placed in this encounter.    No chief complaint on file.    History of Present Illness:    Janet Peters is a 67 y.o. female.  The patient was evaluated by me for chest pain, dyslipidemia and tachycardia.  She has done fine.  She denies any problems at this time and her stress test was unremarkable and she has started walking and exercising at the gym on a regular basis.  She mentions to me that her heart rate is still elevated some.  Past Medical History:  Diagnosis Date  .  Diabetes mellitus without complication ([place])   . Hyperlipidemia   . Osteopenia 10/2016   T score -1.3 FRAX 3.5%/0.1%    Past Surgical History:  Procedure Laterality Date  . ABDOMINAL HYSTERECTOMY    . CATARACT EXTRACTION Left   . CESAREAN SECTION     x2  . CHOLECYSTECTOMY, LAPAROSCOPIC    . KNEE SURGERY Left 2011    Current Medications: Current Meds  Medication Sig  . ACCU-CHEK AVIVA PLUS test strip TEST TWICE DAILY  . atorvastatin (LIPITOR) 10 MG tablet TAKE 1 TABLET(10 MG) BY MOUTH DAILY  . Blood Glucose Calibration (ACCU-CHEK COMPACT PLUS CONTROL) SOLN 1 each by In Vitro route daily as needed.  . Cholecalciferol (VITAMIN D3) 5000 units CAPS Take by mouth.  Marland Kitchen CINNAMON PO Take by mouth.  . fluticasone (FLONASE) 50 MCG/ACT nasal spray Place 2 sprays into both nostrils daily.  . metFORMIN (GLUCOPHAGE) 500 MG tablet TAKE 1 AND 1/2 TABLETS BY MOUTH DAILY WITH BREAKFAST  . metoprolol succinate (TOPROL XL) 25 MG 24 hr tablet Take 1 tablet (25 mg total) by mouth daily.     Allergies:   Patient has no known allergies.   Social History   Socioeconomic History  . Marital status: Married    Spouse name: Not on file  . Number of children: Not on file  . Years of education: Not on file  .  Highest education level: Not on file  Occupational History  . Not on file  Social Needs  . Financial resource strain: Not on file  . Food insecurity:    Worry: Not on file    Inability: Not on file  . Transportation needs:    Medical: Not on file    Non-medical: Not on file  Tobacco Use  . Smoking status: Never Smoker  . Smokeless tobacco: Never Used  Substance and Sexual Activity  . Alcohol use: No  . Drug use: No  . Sexual activity: Yes    Partners: Male    Comment: 1st intercourse- 61, partners - 2   Lifestyle  . Physical activity:    Days per week: Not on file    Minutes per session: Not on file  . Stress: Not on file  Relationships  . Social connections:    Talks on phone:  Not on file    Gets together: Not on file    Attends religious service: Not on file    Active member of club or organization: Not on file    Attends meetings of clubs or organizations: Not on file    Relationship status: Not on file  Other Topics Concern  . Not on file  Social History Narrative  . Not on file     Family History: The patient's family history includes Heart attack in her maternal grandmother.  ROS:   Please see the history of present illness.    All other systems reviewed and are negative.  EKGs/Labs/Other Studies Reviewed:    The following studies were reviewed today: I discussed my findings about the echocardiogram and stress test with the patient at extensive length and she was happy to know the results.   Recent Labs: 06/14/2017: TSH 3.76 09/26/2017: ALT 18; BUN 16; Creatinine, Ser 0.60; Hemoglobin 13.6; Platelets 373.0; Potassium 4.3; Sodium 141  Recent Lipid Panel    Component Value Date/Time   CHOL 228 (H) 09/26/2017 0924   TRIG 248.0 (H) 09/26/2017 0924   HDL 46.40 09/26/2017 0924   CHOLHDL 5 09/26/2017 0924   VLDL 49.6 (H) 09/26/2017 0924   LDLCALC 112 (H) 06/22/2017 0822   LDLDIRECT 141.0 09/26/2017 0924    Physical Exam:    VS:  BP 126/70 (BP Location: Right Arm, Patient Position: Sitting, Cuff Size: Normal)   Pulse 91   Ht 5' (1.524 m)   Wt 171 lb (77.6 kg)   SpO2 98%   BMI 33.40 kg/m     Wt Readings from Last 3 Encounters:  01/01/18 171 lb (77.6 kg)  10/04/17 168 lb 6.4 oz (76.4 kg)  10/03/17 164 lb 12.8 oz (74.8 kg)     GEN: Patient is in no acute distress HEENT: Normal NECK: No JVD; No carotid bruits LYMPHATICS: No lymphadenopathy CARDIAC: Hear sounds regular, 2/6 systolic murmur at the apex. RESPIRATORY:  Clear to auscultation without rales, wheezing or rhonchi  ABDOMEN: Soft, non-tender, non-distended MUSCULOSKELETAL:  No edema; No deformity  SKIN: Warm and dry NEUROLOGIC:  Alert and oriented x 3 PSYCHIATRIC:  Normal  affect   Signed, Jenean Lindau, MD  01/01/2018 9:05 AM    [place]

## 2018-01-28 ENCOUNTER — Ambulatory Visit (INDEPENDENT_AMBULATORY_CARE_PROVIDER_SITE_OTHER): Payer: Medicare Other | Admitting: Medical

## 2018-01-28 ENCOUNTER — Encounter: Payer: Self-pay | Admitting: Medical

## 2018-01-28 VITALS — BP 123/73 | HR 91 | Temp 97.8°F | Resp 16 | Ht 60.0 in | Wt 170.8 lb

## 2018-01-28 DIAGNOSIS — R000 Tachycardia, unspecified: Secondary | ICD-10-CM

## 2018-01-28 DIAGNOSIS — Z23 Encounter for immunization: Secondary | ICD-10-CM | POA: Diagnosis not present

## 2018-01-28 DIAGNOSIS — E119 Type 2 diabetes mellitus without complications: Secondary | ICD-10-CM

## 2018-01-28 DIAGNOSIS — R7989 Other specified abnormal findings of blood chemistry: Secondary | ICD-10-CM | POA: Diagnosis not present

## 2018-01-28 DIAGNOSIS — E785 Hyperlipidemia, unspecified: Secondary | ICD-10-CM

## 2018-01-28 LAB — COMPREHENSIVE METABOLIC PANEL
ALBUMIN: 4.5 g/dL (ref 3.5–5.2)
ALT: 21 U/L (ref 0–35)
AST: 15 U/L (ref 0–37)
Alkaline Phosphatase: 100 U/L (ref 39–117)
BUN: 16 mg/dL (ref 6–23)
CALCIUM: 9.9 mg/dL (ref 8.4–10.5)
CHLORIDE: 103 meq/L (ref 96–112)
CO2: 27 mEq/L (ref 19–32)
CREATININE: 0.67 mg/dL (ref 0.40–1.20)
GFR: 93.18 mL/min (ref >60.00)
Glucose, Bld: 108 mg/dL — ABNORMAL HIGH (ref 70–99)
Potassium: 4.2 mEq/L (ref 3.5–5.1)
Sodium: 139 mEq/L (ref 135–145)
Total Bilirubin: 0.5 mg/dL (ref 0.2–1.2)
Total Protein: 7.6 g/dL (ref 6.0–8.3)

## 2018-01-28 LAB — HEMOGLOBIN A1C: HEMOGLOBIN A1C: 6.3 % (ref 4.6–6.5)

## 2018-01-28 LAB — VITAMIN D 25 HYDROXY (VIT D DEFICIENCY, FRACTURES): VITD: 56.02 ng/mL (ref 30.00–100.00)

## 2018-01-28 NOTE — Progress Notes (Signed)
Subjective:    Patient ID: Janet Peters, female    DOB: September 03, 1950, 67 y.o.   MRN: [id number]  HPI  Pt in for follow up for diabetes. Last a1c was controlled in the summer. She is exercising and eating low sugar diet.  Pt has triglyceride elevated but only 5 points on last check 3 weeks ago.  Pt has low vit D. Last check in July was low. She is on supplement.  Pt does not want flu vaccine  Pt has seen cardiologist for mild tachycardia. B-blocker increase to 50 mg q day. No side effects. Pulse when checked less than 100.     Review of Systems  Constitutional: Negative for activity change, chills, diaphoresis, fatigue and fever.  Respiratory: Negative for cough, chest tightness, shortness of breath and wheezing.   Cardiovascular: Negative for chest pain, palpitations and leg swelling.  Gastrointestinal: Negative for abdominal pain, nausea and vomiting.  Musculoskeletal: Negative for neck pain and neck stiffness.  Neurological: Negative for dizziness, facial asymmetry, weakness and numbness.  Psychiatric/Behavioral: Negative for agitation, behavioral problems and confusion. The patient is not nervous/anxious.     Past Medical History:  Diagnosis Date  . Diabetes mellitus without complication ([place])   . Hyperlipidemia   . Osteopenia 10/2016   T score -1.3 FRAX 3.5%/0.1%     Social History   Socioeconomic History  . Marital status: Married    Spouse name: Not on file  . Number of children: Not on file  . Years of education: Not on file  . Highest education level: Not on file  Occupational History  . Not on file  Social Needs  . Financial resource strain: Not on file  . Food insecurity:    Worry: Not on file    Inability: Not on file  . Transportation needs:    Medical: Not on file    Non-medical: Not on file  Tobacco Use  . Smoking status: Never Smoker  . Smokeless tobacco: Never Used  Substance and Sexual Activity  . Alcohol use: No  . Drug use: No    . Sexual activity: Yes    Partners: Male    Comment: 1st intercourse- 61, partners - 2   Lifestyle  . Physical activity:    Days per week: Not on file    Minutes per session: Not on file  . Stress: Not on file  Relationships  . Social connections:    Talks on phone: Not on file    Gets together: Not on file    Attends religious service: Not on file    Active member of club or organization: Not on file    Attends meetings of clubs or organizations: Not on file    Relationship status: Not on file  . Intimate partner violence:    Fear of current or ex partner: Not on file    Emotionally abused: Not on file    Physically abused: Not on file    Forced sexual activity: Not on file  Other Topics Concern  . Not on file  Social History Narrative  . Not on file    Past Surgical History:  Procedure Laterality Date  . ABDOMINAL HYSTERECTOMY    . CATARACT EXTRACTION Left   . CESAREAN SECTION     x2  . CHOLECYSTECTOMY, LAPAROSCOPIC    . KNEE SURGERY Left 2011    Family History  Problem Relation Age of Onset  . Heart attack Maternal Grandmother     No Known  Allergies  Current Outpatient Medications on File Prior to Visit  Medication Sig Dispense Refill  . ACCU-CHEK AVIVA PLUS test strip TEST TWICE DAILY 100 each 1  . atorvastatin (LIPITOR) 10 MG tablet TAKE 1 TABLET(10 MG) BY MOUTH DAILY 90 tablet 3  . Blood Glucose Calibration (ACCU-CHEK COMPACT PLUS CONTROL) SOLN 1 each by In Vitro route daily as needed. 1 each 1  . Cholecalciferol (VITAMIN D3) 5000 units CAPS Take by mouth.    Marland Kitchen CINNAMON PO Take by mouth.    . fluticasone (FLONASE) 50 MCG/ACT nasal spray Place 2 sprays into both nostrils daily. 16 g 1  . metFORMIN (GLUCOPHAGE) 500 MG tablet TAKE 1 AND 1/2 TABLETS BY MOUTH DAILY WITH BREAKFAST 135 tablet 0  . metoprolol succinate (TOPROL-XL) 50 MG 24 hr tablet Take 1 tablet (50 mg total) by mouth daily. Take with or immediately following a meal. 90 tablet 2   No current  facility-administered medications on file prior to visit.     BP 123/73   Pulse 91   Temp 97.8 F (36.6 C) (Oral)   Resp 16   Ht 5' (1.524 m)   Wt 170 lb 12.8 oz (77.5 kg)   SpO2 99%   BMI 33.36 kg/m       Objective:   Physical Exam  General Mental Status- Alert. General Appearance- Not in acute distress.   Skin General: Color- Normal Color. Moisture- Normal Moisture.  Neck Carotid Arteries- Normal color. Moisture- Normal Moisture. No carotid bruits. No JVD.  Chest and Lung Exam Auscultation: Breath Sounds:-Normal.  Cardiovascular Auscultation:Rythm- Regular. Murmurs & Other Heart Sounds:Auscultation of the heart reveals- No Murmurs.  Abdomen Inspection:-Inspeection Normal. Palpation/Percussion:Note:No mass. Palpation and Percussion of the abdomen reveal- Non Tender, Non Distended + BS, no rebound or guarding.    Neurologic Cranial Nerve exam:- CN III-XII intact(No nystagmus), symmetric smile. Strength:- 5/5 equal and symmetric strength both upper and lower extremities.      Assessment & Plan:  For your diabetes will get metabolic panel and Y4M to see how you are doing with 3 month sugar average. Continue low cholesterol diet and exercise.  For low vit D will get Vit D level today.  For hx of high cholesterol continue with low cholesterol diet and exercise.  Will get psv-13 today. Flu vaccine declined.  For mild tachycardia continue low metoprolol 50 mg q day.  Follow up date in 3 months or as needed  25 minutes spent with pt. 50% of time spent counseling pt on diagnosis.  Mackie Pai, PA-C

## 2018-01-28 NOTE — Patient Instructions (Addendum)
For your diabetes will get metabolic panel and Y6V to see how you are doing with 3 month sugar average. Continue low cholesterol diet and exercise.  For low vit D will get Vit D level today.  For hx of high cholesterol continue with low cholesterol diet and exercise.  Will get psv-13 today. Flu vaccine declined.  For mild tachycardia continue low metoprolol 50 mg q day.  Follow up date in 3 months or as needed

## 2018-02-01 ENCOUNTER — Telehealth: Payer: Self-pay | Admitting: Medical

## 2018-02-01 ENCOUNTER — Other Ambulatory Visit: Payer: Self-pay | Admitting: Medical

## 2018-02-01 DIAGNOSIS — Z794 Long term (current) use of insulin: Principal | ICD-10-CM

## 2018-02-01 DIAGNOSIS — E118 Type 2 diabetes mellitus with unspecified complications: Secondary | ICD-10-CM

## 2018-02-01 MED ORDER — GLUCOSE BLOOD VI STRP: ORAL_STRIP | 1 refills | Status: DC

## 2018-02-01 NOTE — Telephone Encounter (Signed)
Rx sent to pharmacy   

## 2018-02-01 NOTE — Telephone Encounter (Signed)
Copied from [place] [id number]. Topic: Quick Communication - See Telephone Encounter >> Feb 01, 2018  3:29 PM Vernona Rieger wrote: CRM for notification. See Telephone encounter for: 02/01/18.  [facility] called and said the patient is requesting a new diabetic meter. She states the one that she has is broken. Accu-Check Avida Blood Meter. Can that be sent to a local pharmacy?  [id number] - [place], [place] - [address] AT [address] [place] [address]

## 2018-02-08 ENCOUNTER — Other Ambulatory Visit: Payer: Self-pay | Admitting: Medical

## 2018-02-08 DIAGNOSIS — E785 Hyperlipidemia, unspecified: Secondary | ICD-10-CM

## 2018-02-18 ENCOUNTER — Telehealth: Payer: Self-pay | Admitting: Medical

## 2018-02-18 ENCOUNTER — Other Ambulatory Visit: Payer: Self-pay

## 2018-02-18 DIAGNOSIS — E785 Hyperlipidemia, unspecified: Secondary | ICD-10-CM

## 2018-02-18 MED ORDER — ATORVASTATIN CALCIUM 10 MG PO TABS: ORAL_TABLET | ORAL | 1 refills | Status: DC

## 2018-02-18 NOTE — Telephone Encounter (Signed)
Patient husband called to say that she will be going out of the country on Thursday 02/21/18 and need a 90 day supply of atorvastatin (LIPITOR) 10 MG tablet. They have a Rx waiting at the pharmacy that was sent in on 02/08/18 but it was only sent in for 30 day supply. Please assist..Ph# [phone]

## 2018-02-18 NOTE — Telephone Encounter (Signed)
Telephone call from Patient's husband states they did not pick up Rx for  Lipitor 10mg .  #30.   Patients husband  Who is on DPR states that His wife is going out of town  02/21/18.  Thursday.     They request # 90 of Lipitor to be called into pharmacy. [facility] [address] in [place].   Contact#  For Patient is [phone].

## 2018-02-19 MED ORDER — ATORVASTATIN CALCIUM 10 MG PO TABS: ORAL_TABLET | ORAL | 1 refills | Status: DC

## 2018-02-19 NOTE — Telephone Encounter (Addendum)
rx lipitor  sent to pt pharmacy.

## 2018-02-19 NOTE — Addendum Note (Signed)
Addended by: Anabel Halon on: 02/19/2018 09:14 AM   Modules accepted: Orders

## 2018-03-15 ENCOUNTER — Ambulatory Visit (HOSPITAL_BASED_OUTPATIENT_CLINIC_OR_DEPARTMENT_OTHER): Payer: Medicare Other

## 2018-03-19 ENCOUNTER — Other Ambulatory Visit: Payer: Self-pay | Admitting: Medical

## 2018-03-24 ENCOUNTER — Other Ambulatory Visit: Payer: Self-pay | Admitting: Medical

## 2018-05-07 ENCOUNTER — Ambulatory Visit (HOSPITAL_BASED_OUTPATIENT_CLINIC_OR_DEPARTMENT_OTHER): Payer: Medicare Other

## 2018-05-13 ENCOUNTER — Ambulatory Visit (HOSPITAL_BASED_OUTPATIENT_CLINIC_OR_DEPARTMENT_OTHER)
Admission: RE | Admit: 2018-05-13 | Discharge: 2018-05-13 | Disposition: A | Discharge status: Home / Self Care | Payer: Medicare Other | Source: Ambulatory Visit | Attending: Medical | Admitting: Medical

## 2018-05-13 DIAGNOSIS — Z1231 Encounter for screening mammogram for malignant neoplasm of breast: Secondary | ICD-10-CM | POA: Insufficient documentation

## 2018-05-29 ENCOUNTER — Ambulatory Visit (INDEPENDENT_AMBULATORY_CARE_PROVIDER_SITE_OTHER): Payer: Medicare Other | Admitting: Medical

## 2018-05-29 ENCOUNTER — Encounter: Payer: Self-pay | Admitting: Medical

## 2018-05-29 VITALS — BP 140/80 | HR 82 | Temp 98.0°F | Resp 16 | Ht 60.0 in | Wt 173.2 lb

## 2018-05-29 DIAGNOSIS — E785 Hyperlipidemia, unspecified: Secondary | ICD-10-CM | POA: Diagnosis not present

## 2018-05-29 DIAGNOSIS — Z794 Long term (current) use of insulin: Secondary | ICD-10-CM

## 2018-05-29 DIAGNOSIS — R7989 Other specified abnormal findings of blood chemistry: Secondary | ICD-10-CM | POA: Diagnosis not present

## 2018-05-29 DIAGNOSIS — E118 Type 2 diabetes mellitus with unspecified complications: Secondary | ICD-10-CM

## 2018-05-29 LAB — LIPID PANEL
CHOL/HDL RATIO: 3
CHOLESTEROL: 159 mg/dL (ref 0–200)
HDL: 55.1 mg/dL (ref >39.00)
LDL CALC: 78 mg/dL (ref 0–99)
NonHDL: 104.35
TRIGLYCERIDES: 130 mg/dL (ref 0.0–149.0)
VLDL: 26 mg/dL (ref 0.0–40.0)

## 2018-05-29 LAB — COMPREHENSIVE METABOLIC PANEL
ALT: 22 U/L (ref 0–35)
AST: 22 U/L (ref 0–37)
Albumin: 4.3 g/dL (ref 3.5–5.2)
Alkaline Phosphatase: 103 U/L (ref 39–117)
BUN: 20 mg/dL (ref 6–23)
CALCIUM: 9.5 mg/dL (ref 8.4–10.5)
CHLORIDE: 103 meq/L (ref 96–112)
CO2: 27 meq/L (ref 19–32)
CREATININE: 0.63 mg/dL (ref 0.40–1.20)
GFR: 94.03 mL/min (ref >60.00)
Glucose, Bld: 110 mg/dL — ABNORMAL HIGH (ref 70–99)
POTASSIUM: 4 meq/L (ref 3.5–5.1)
Sodium: 140 mEq/L (ref 135–145)
Total Bilirubin: 0.5 mg/dL (ref 0.2–1.2)
Total Protein: 7.4 g/dL (ref 6.0–8.3)

## 2018-05-29 LAB — HEMOGLOBIN A1C: Hgb A1c MFr Bld: 6.7 % — ABNORMAL HIGH (ref 4.6–6.5)

## 2018-05-29 NOTE — Patient Instructions (Addendum)
For diabetes, will get a1c and cmp.  For low vit D history, will get vit D level today.  For high cholesterol, will get lipid panel today.   Please discuss with your cardiologist if he thinks holter or other studies are necessary. Keep appointment with them in April.  For borderline high bp. Please check bp daily for one week. If bp not close to 130/80 let me know and will rx low dose ace inhibitor.  Follow in 3 months or as needed

## 2018-05-29 NOTE — Progress Notes (Signed)
Subjective:    Patient ID: Tenea Sens, female    DOB: July 14, 1950, 68 y.o.   MRN: [id number]  HPI  Pt states she has been eating healthy and low sugar diet. Going to gym 3-4 times a week. In November last a1c was controlled.  Pt is fasting today.  Pt bp mild elevated initially at 416 systolic. Usually bp better on past visits.    Review of Systems  Constitutional: Negative for chills, diaphoresis, fatigue and fever.  HENT: Negative for dental problem.   Respiratory: Negative for cough, chest tightness, shortness of breath and wheezing.   Cardiovascular: Negative for chest pain and palpitations.  Gastrointestinal: Negative for abdominal pain, blood in stool and diarrhea.  Genitourinary: Negative for dysuria.  Musculoskeletal: Negative for back pain.  Skin: Negative for rash.  Neurological: Negative for dizziness, speech difficulty, weakness, numbness and headaches.  Hematological: Negative for adenopathy. Does not bruise/bleed easily.  Psychiatric/Behavioral: Negative for behavioral problems and confusion.    Past Medical History:  Diagnosis Date  . Diabetes mellitus without complication ([place])   . Hyperlipidemia   . Osteopenia 10/2016   T score -1.3 FRAX 3.5%/0.1%     Social History   Socioeconomic History  . Marital status: Married    Spouse name: Not on file  . Number of children: Not on file  . Years of education: Not on file  . Highest education level: Not on file  Occupational History  . Not on file  Social Needs  . Financial resource strain: Not on file  . Food insecurity:    Worry: Not on file    Inability: Not on file  . Transportation needs:    Medical: Not on file    Non-medical: Not on file  Tobacco Use  . Smoking status: Never Smoker  . Smokeless tobacco: Never Used  Substance and Sexual Activity  . Alcohol use: No  . Drug use: No  . Sexual activity: Yes    Partners: Male    Comment: 1st intercourse- 61, partners - 2   Lifestyle    . Physical activity:    Days per week: Not on file    Minutes per session: Not on file  . Stress: Not on file  Relationships  . Social connections:    Talks on phone: Not on file    Gets together: Not on file    Attends religious service: Not on file    Active member of club or organization: Not on file    Attends meetings of clubs or organizations: Not on file    Relationship status: Not on file  . Intimate partner violence:    Fear of current or ex partner: Not on file    Emotionally abused: Not on file    Physically abused: Not on file    Forced sexual activity: Not on file  Other Topics Concern  . Not on file  Social History Narrative  . Not on file    Past Surgical History:  Procedure Laterality Date  . ABDOMINAL HYSTERECTOMY    . CATARACT EXTRACTION Left   . CESAREAN SECTION     x2  . CHOLECYSTECTOMY, LAPAROSCOPIC    . KNEE SURGERY Left 2011    Family History  Problem Relation Age of Onset  . Heart attack Maternal Grandmother     No Known Allergies  Current Outpatient Medications on File Prior to Visit  Medication Sig Dispense Refill  . ACCU-CHEK AVIVA PLUS test strip USE TWICE DAILY AS  DIRECTED 100 each 11  . atorvastatin (LIPITOR) 10 MG tablet 1 tab po q day 90 tablet 1  . Blood Glucose Calibration (ACCU-CHEK COMPACT PLUS CONTROL) SOLN 1 each by In Vitro route daily as needed. 1 each 1  . Cholecalciferol (VITAMIN D3) 5000 units CAPS Take by mouth.    Marland Kitchen CINNAMON PO Take by mouth.    . fluticasone (FLONASE) 50 MCG/ACT nasal spray SHAKE LIQUID AND USE 2 SPRAYS IN EACH NOSTRIL DAILY 16 g 0  . metFORMIN (GLUCOPHAGE) 500 MG tablet TAKE 1 AND 1/2 TABLETS BY MOUTH DAILY WITH BREAKFAST 135 tablet 0  . metoprolol succinate (TOPROL-XL) 50 MG 24 hr tablet Take 1 tablet (50 mg total) by mouth daily. Take with or immediately following a meal. 90 tablet 2   No current facility-administered medications on file prior to visit.     BP (!) 141/67   Pulse 82   Temp 98 F  (36.7 C) (Oral)   Resp 16   Ht 5' (1.524 m)   Wt 173 lb 3.2 oz (78.6 kg)   SpO2 99%   BMI 33.83 kg/m       Objective:   Physical Exam  General Mental Status- Alert. General Appearance- Not in acute distress.   Skin General: Color- Normal Color. Moisture- Normal Moisture.  Neck Carotid Arteries- Normal color. Moisture- Normal Moisture. No carotid bruits. No JVD.  Chest and Lung Exam Auscultation: Breath Sounds:-Normal.  Cardiovascular Auscultation:Rythm- Regular. Murmurs & Other Heart Sounds:Auscultation of the heart reveals- No Murmurs.  Abdomen Inspection:-Inspeection Normal. Palpation/Percussion:Note:No mass. Palpation and Percussion of the abdomen reveal- Non Tender, Non Distended + BS, no rebound or guarding.  Neurologic Cranial Nerve exam:- CN III-XII intact(No nystagmus), symmetric smile. Strength:- 5/5 equal and symmetric strength both upper and lower extremities.      Assessment & Plan:  For diabetes, will get a1c and cmp.  For low vit D history, will get vit D level today.  For high cholesterol, will get lipid panel today.   Please discuss with your cardiologist if he thinks holter or other studies are necessary. Keep appointment with them in April.  For borderline high bp. Please check bp daily for one week. If bp not close to 130/80 let me know and will rx low dose ace inhibitor.  Follow in 3 months or as needed  25 minutes spent with pt. 50% of time spent counseling pt on plan going forward. Answered pt question on if further test on heart need to be done. Also advised pt on plan if bp not close to 130/80.  Mackie Pai, PA-C

## 2018-06-03 LAB — VITAMIN D 1,25 DIHYDROXY
VITAMIN D 1, 25 (OH) TOTAL: 35 pg/mL (ref 18–72)
VITAMIN D3 1, 25 (OH): 35 pg/mL
Vitamin D2 1, 25 (OH)2: <8 pg/mL

## 2018-08-06 ENCOUNTER — Other Ambulatory Visit: Payer: Self-pay | Admitting: Medical

## 2018-09-16 ENCOUNTER — Encounter: Payer: Self-pay | Admitting: Medical

## 2018-09-26 ENCOUNTER — Ambulatory Visit: Payer: Medicare Other | Admitting: Medical

## 2018-10-01 ENCOUNTER — Ambulatory Visit (INDEPENDENT_AMBULATORY_CARE_PROVIDER_SITE_OTHER): Payer: Medicare Other | Admitting: Medical

## 2018-10-01 ENCOUNTER — Encounter: Payer: Self-pay | Admitting: Medical

## 2018-10-01 ENCOUNTER — Other Ambulatory Visit: Payer: Self-pay

## 2018-10-01 VITALS — BP 133/77 | HR 102 | Temp 98.2°F | Resp 16 | Ht 60.0 in | Wt 177.4 lb

## 2018-10-01 DIAGNOSIS — E785 Hyperlipidemia, unspecified: Secondary | ICD-10-CM

## 2018-10-01 DIAGNOSIS — E118 Type 2 diabetes mellitus with unspecified complications: Secondary | ICD-10-CM

## 2018-10-01 DIAGNOSIS — F419 Anxiety disorder, unspecified: Secondary | ICD-10-CM | POA: Diagnosis not present

## 2018-10-01 DIAGNOSIS — Z794 Long term (current) use of insulin: Secondary | ICD-10-CM

## 2018-10-01 LAB — HEMOGLOBIN A1C: Hgb A1c MFr Bld: 6.8 % — ABNORMAL HIGH (ref 4.6–6.5)

## 2018-10-01 LAB — CBC WITH DIFFERENTIAL/PLATELET
Basophils Absolute: 0 10*3/uL (ref 0.0–0.1)
Basophils Relative: 0.5 % (ref 0.0–3.0)
Eosinophils Absolute: 0.3 10*3/uL (ref 0.0–0.7)
Eosinophils Relative: 3.1 % (ref 0.0–5.0)
HCT: 39.8 % (ref 36.0–46.0)
Hemoglobin: 13.4 g/dL (ref 12.0–15.0)
Lymphocytes Relative: 28.1 % (ref 12.0–46.0)
Lymphs Abs: 2.5 10*3/uL (ref 0.7–4.0)
MCHC: 33.8 g/dL (ref 30.0–36.0)
MCV: 89.4 fl (ref 78.0–100.0)
Monocytes Absolute: 0.5 10*3/uL (ref 0.1–1.0)
Monocytes Relative: 6.2 % (ref 3.0–12.0)
Neutro Abs: 5.5 10*3/uL (ref 1.4–7.7)
Neutrophils Relative %: 62.1 % (ref 43.0–77.0)
Platelets: 336 10*3/uL (ref 150.0–400.0)
RBC: 4.45 Mil/uL (ref 3.87–5.11)
RDW: 13.3 % (ref 11.5–15.5)
WBC: 8.8 10*3/uL (ref 4.0–10.5)

## 2018-10-01 LAB — LIPID PANEL
Cholesterol: 168 mg/dL (ref 0–200)
HDL: 49.1 mg/dL (ref >39.00)
NonHDL: 119.29
Total CHOL/HDL Ratio: 3
Triglycerides: 237 mg/dL — ABNORMAL HIGH (ref 0.0–149.0)
VLDL: 47.4 mg/dL — ABNORMAL HIGH (ref 0.0–40.0)

## 2018-10-01 LAB — COMPREHENSIVE METABOLIC PANEL
ALT: 20 U/L (ref 0–35)
AST: 17 U/L (ref 0–37)
Albumin: 4.3 g/dL (ref 3.5–5.2)
Alkaline Phosphatase: 99 U/L (ref 39–117)
BUN: 11 mg/dL (ref 6–23)
CO2: 26 mEq/L (ref 19–32)
Calcium: 9.2 mg/dL (ref 8.4–10.5)
Chloride: 103 mEq/L (ref 96–112)
Creatinine, Ser: 0.66 mg/dL (ref 0.40–1.20)
GFR: 89.02 mL/min (ref >60.00)
Glucose, Bld: 129 mg/dL — ABNORMAL HIGH (ref 70–99)
Potassium: 4.3 mEq/L (ref 3.5–5.1)
Sodium: 139 mEq/L (ref 135–145)
Total Bilirubin: 0.4 mg/dL (ref 0.2–1.2)
Total Protein: 7.4 g/dL (ref 6.0–8.3)

## 2018-10-01 LAB — LDL CHOLESTEROL, DIRECT: Direct LDL: 92 mg/dL

## 2018-10-01 MED ORDER — BUSPIRONE HCL 7.5 MG PO TABS: 7.5000 mg | ORAL_TABLET | Freq: Two times a day (BID) | ORAL | 0 refills | Status: DC

## 2018-10-01 NOTE — Progress Notes (Signed)
Subjective:    Patient ID: Janet Peters, female    DOB: 1950/06/28, 68 y.o.   MRN: [id number]  HPI  Pt states her sugars have been around 100-120 when she checks. Her a1c 4 months ago was 6.7.  One reading recently was 172 fasting today. Pt is on metformin.  Pt is on lipitor for high cholesterol.   Pt also mentions she is very stressed about covid. Her home country has closed borders. She can't visit her parents or daughter in [place].   Review of Systems  Constitutional: Negative for chills, fatigue and fever.  Respiratory: Negative for cough, chest tightness, shortness of breath and wheezing.   Cardiovascular: Negative for chest pain and palpitations.  Gastrointestinal: Negative for abdominal pain, nausea and vomiting.  Genitourinary: Negative for dysuria, flank pain and frequency.  Musculoskeletal: Negative for back pain.  Skin: Negative for rash.  Neurological: Negative for dizziness and headaches.  Hematological: Negative for adenopathy. Does not bruise/bleed easily.  Psychiatric/Behavioral: Positive for dysphoric mood. Negative for behavioral problems, confusion, sleep disturbance and suicidal ideas. The patient is nervous/anxious.        Some stress and anxious about covid.  Also sad.  Pt states more constant anxiety.    Past Medical History:  Diagnosis Date  . Diabetes mellitus without complication ([place])   . Hyperlipidemia   . Osteopenia 10/2016   T score -1.3 FRAX 3.5%/0.1%     Social History   Socioeconomic History  . Marital status: Married    Spouse name: Not on file  . Number of children: Not on file  . Years of education: Not on file  . Highest education level: Not on file  Occupational History  . Not on file  Social Needs  . Financial resource strain: Not on file  . Food insecurity    Worry: Not on file    Inability: Not on file  . Transportation needs    Medical: Not on file    Non-medical: Not on file  Tobacco Use  . Smoking  status: Never Smoker  . Smokeless tobacco: Never Used  Substance and Sexual Activity  . Alcohol use: No  . Drug use: No  . Sexual activity: Yes    Partners: Male    Comment: 1st intercourse- 60, partners - 2   Lifestyle  . Physical activity    Days per week: Not on file    Minutes per session: Not on file  . Stress: Not on file  Relationships  . Social Herbalist on phone: Not on file    Gets together: Not on file    Attends religious service: Not on file    Active member of club or organization: Not on file    Attends meetings of clubs or organizations: Not on file    Relationship status: Not on file  . Intimate partner violence    Fear of current or ex partner: Not on file    Emotionally abused: Not on file    Physically abused: Not on file    Forced sexual activity: Not on file  Other Topics Concern  . Not on file  Social History Narrative  . Not on file    Past Surgical History:  Procedure Laterality Date  . ABDOMINAL HYSTERECTOMY    . CATARACT EXTRACTION Left   . CESAREAN SECTION     x2  . CHOLECYSTECTOMY, LAPAROSCOPIC    . KNEE SURGERY Left 2011    Family History  Problem  Relation Age of Onset  . Heart attack Maternal Grandmother     No Known Allergies  Current Outpatient Medications on File Prior to Visit  Medication Sig Dispense Refill  . ACCU-CHEK AVIVA PLUS test strip USE TWICE DAILY AS DIRECTED 100 each 11  . atorvastatin (LIPITOR) 10 MG tablet 1 tab po q day 90 tablet 1  . Blood Glucose Calibration (ACCU-CHEK COMPACT PLUS CONTROL) SOLN 1 each by In Vitro route daily as needed. 1 each 1  . Cholecalciferol (VITAMIN D3) 5000 units CAPS Take by mouth.    Marland Kitchen CINNAMON PO Take by mouth.    . fluticasone (FLONASE) 50 MCG/ACT nasal spray SHAKE LIQUID AND USE 2 SPRAYS IN EACH NOSTRIL DAILY 16 g 0  . metFORMIN (GLUCOPHAGE) 500 MG tablet Take 1.5 tablets (750 mg total) by mouth daily with breakfast. 135 tablet 1  . metoprolol succinate (TOPROL-XL) 50  MG 24 hr tablet Take 1 tablet (50 mg total) by mouth daily. Take with or immediately following a meal. 90 tablet 2   No current facility-administered medications on file prior to visit.     BP 133/77   Pulse (!) 102   Temp 98.2 F (36.8 C) (Oral)   Resp 16   Ht 5' (1.524 m)   Wt 177 lb 6.4 oz (80.5 kg)   SpO2 99%   BMI 34.65 kg/m       Objective:   Physical Exam  General   Mental Status- Alert. General Appearance- Not in acute distress.   Skin General: Color- Normal Color. Moisture- Normal Moisture.  Neck Carotid Arteries- Normal color. Moisture- Normal Moisture. No carotid bruits. No JVD.  Chest and Lung Exam Auscultation: Breath Sounds:-Normal.  Cardiovascular Auscultation:Rythm- Regular. Murmurs & Other Heart Sounds:Auscultation of the heart reveals- No Murmurs.  Abdomen Inspection:-Inspeection Normal. Palpation/Percussion:Note:No mass. Palpation and Percussion of the abdomen reveal- Non Tender, Non Distended + BS, no rebound or guarding.  Neurologic Cranial Nerve exam:- CN III-XII intact(No nystagmus), symmetric smile. Strength:- 5/5 equal and symmetric strength both upper and lower extremities.      Assessment & Plan:  For diabetes, continue current medication. I am satisfied if your a1c stays below 7.0. Continue metformin and will get cmp & a1c today.  For high cholesterol, I ordered lipid panel. Will follow results and adjust statin if necessary.  For anxiety, I rx'd buspar today. Hopefully you will feel well with this.  Follow up 2 weeks or as needed.(video or telephone visit)  25 minute total spent with pt. 50% of time spent with pt counseling on dx and tx. More time spent on anxiety and challenging circumstances.  Mackie Pai, PA-C

## 2018-10-01 NOTE — Patient Instructions (Addendum)
For diabetes, continue current medication. I am satisfied if your a1c stays below 7.0. Continue metformin and will get cmp & a1c today.  For high cholesterol, I ordered lipid panel. Will follow results and adjust statin if necessary.  For anxiety, I rx'd buspar today. Hopefully you will feel well with this.  Follow up 2 weeks or as needed.(video or telephone visit)

## 2018-10-02 LAB — MICROALBUMIN, URINE: Microalb, Ur: 0.3 mg/dL

## 2018-10-02 NOTE — Progress Notes (Signed)
All results given to patient (in [employer]) she verbalized understanding and will keep working on a diet low on sugar, carbs and fat.

## 2018-10-09 ENCOUNTER — Other Ambulatory Visit: Payer: Self-pay

## 2018-10-10 ENCOUNTER — Encounter: Payer: Self-pay | Admitting: Obstetrics & Gynecology

## 2018-10-10 ENCOUNTER — Ambulatory Visit (INDEPENDENT_AMBULATORY_CARE_PROVIDER_SITE_OTHER): Payer: Medicare Other | Admitting: Obstetrics & Gynecology

## 2018-10-10 VITALS — BP 140/80 | Ht 60.0 in | Wt 178.0 lb

## 2018-10-10 DIAGNOSIS — Z780 Asymptomatic menopausal state: Secondary | ICD-10-CM

## 2018-10-10 DIAGNOSIS — E6609 Other obesity due to excess calories: Secondary | ICD-10-CM

## 2018-10-10 DIAGNOSIS — Z6834 Body mass index (BMI) 34.0-34.9, adult: Secondary | ICD-10-CM

## 2018-10-10 DIAGNOSIS — M8588 Other specified disorders of bone density and structure, other site: Secondary | ICD-10-CM | POA: Diagnosis not present

## 2018-10-10 DIAGNOSIS — Z01419 Encounter for gynecological examination (general) (routine) without abnormal findings: Secondary | ICD-10-CM

## 2018-10-10 DIAGNOSIS — Z90710 Acquired absence of both cervix and uterus: Secondary | ICD-10-CM

## 2018-10-10 NOTE — Patient Instructions (Signed)
1. Well female exam with routine gynecological exam Gynecologic exam status post total hysterectomy in menopause.  Pap test July 2019 was negative, no indication to repeat this year.  Breast exam normal.  Screening mammogram in February 2020 was negative.  Health labs with family physician.  Colonoscopy in 2011.  2. Hx of total hysterectomy  3. Menopause present Well on no hormone replacement therapy.  4. Osteopenia of lumbar spine Will repeat bone density here now.  Vitamin D supplements, calcium intake of 1200 mg daily and regular weightbearing physical activity is recommended. - DG Bone Density; Future  5. Class 1 obesity due to excess calories without serious comorbidity with body mass index (BMI) of 34.0 to 34.9 in adult Recommend a lower calorie/carb diet such as [employer].  Aerobic physical activities 5 times a week and weightlifting every 2 days.  Kyani, fue un placer verle hoy!

## 2018-10-10 NOTE — Progress Notes (Signed)
Janet Peters Mar 28, 1950 [id number]   History:    68 y.o. G2P2L2 Married.  From [place].  RP:  Established patient presenting for annual gyn exam   HPI: S/P Total Hysterectomy.  Menopause, well on no HRT.  No pelvic pain.  No pain with IC.  Urine/BMs normal.  Breasts normal.  BMI increased to 34.76, gained 10 Lbs x last year.  Walking regularly.  Health labs with Fam MD.  Past medical history,surgical history, family history and social history were all reviewed and documented in the EPIC chart.  Gynecologic History No LMP recorded. Patient has had a hysterectomy. Contraception: status post hysterectomy Last Pap: 09/2017. Results were: Negative Last mammogram: 04/2018. Results were: Negative Bone Density: 10/2016 Osteopenia Colonoscopy: 2011  Obstetric History OB History  Gravida Para Term Preterm AB Living  2 2       2   SAB TAB Ectopic Multiple Live Births               # Outcome Date GA Lbr Len/2nd Weight Sex Delivery Anes PTL Lv  2 Para           1 Para              ROS: A ROS was performed and pertinent positives and negatives are included in the history.  GENERAL: No fevers or chills. HEENT: No change in vision, no earache, sore throat or sinus congestion. NECK: No pain or stiffness. CARDIOVASCULAR: No chest pain or pressure. No palpitations. PULMONARY: No shortness of breath, cough or wheeze. GASTROINTESTINAL: No abdominal pain, nausea, vomiting or diarrhea, melena or bright red blood per rectum. GENITOURINARY: No urinary frequency, urgency, hesitancy or dysuria. MUSCULOSKELETAL: No joint or muscle pain, no back pain, no recent trauma. DERMATOLOGIC: No rash, no itching, no lesions. ENDOCRINE: No polyuria, polydipsia, no heat or cold intolerance. No recent change in weight. HEMATOLOGICAL: No anemia or easy bruising or bleeding. NEUROLOGIC: No headache, seizures, numbness, tingling or weakness. PSYCHIATRIC: No depression, no loss of interest in normal activity or change  in sleep pattern.     Exam:   BP 140/80   Ht 5' (1.524 m)   Wt 178 lb (80.7 kg)   BMI 34.76 kg/m   Body mass index is 34.76 kg/m.  General appearance : Well developed well nourished female. No acute distress HEENT: Eyes: no retinal hemorrhage or exudates,  Neck supple, trachea midline, no carotid bruits, no thyroidmegaly Lungs: Clear to auscultation, no rhonchi or wheezes, or rib retractions  Heart: Regular rate and rhythm, no murmurs or gallops Breast:Examined in sitting and supine position were symmetrical in appearance, no palpable masses or tenderness,  no skin retraction, no nipple inversion, no nipple discharge, no skin discoloration, no axillary or supraclavicular lymphadenopathy Abdomen: no palpable masses or tenderness, no rebound or guarding Extremities: no edema or skin discoloration or tenderness  Pelvic: Vulva: Normal             Vagina: No gross lesions or discharge  Cervix/Uterus absent  Adnexa  Without masses or tenderness  Anus: Normal   Assessment/Plan:  67 y.o. female for annual exam   1. Well female exam with routine gynecological exam Gynecologic exam status post total hysterectomy in menopause.  Pap test July 2019 was negative, no indication to repeat this year.  Breast exam normal.  Screening mammogram in February 2020 was negative.  Health labs with family physician.  Colonoscopy in 2011.  2. Hx of total hysterectomy  3. Menopause present Well on  no hormone replacement therapy.  4. Osteopenia of lumbar spine Will repeat bone density here now.  Vitamin D supplements, calcium intake of 1200 mg daily and regular weightbearing physical activity is recommended. - DG Bone Density; Future  5. Class 1 obesity due to excess calories without serious comorbidity with body mass index (BMI) of 34.0 to 34.9 in adult Recommend a lower calorie/carb diet such as [employer].  Aerobic physical activities 5 times a week and weightlifting every 2 days.   Princess Bruins MD, 8:27 AM 10/10/2018

## 2018-10-14 ENCOUNTER — Other Ambulatory Visit: Payer: Self-pay | Admitting: Medical

## 2018-10-15 ENCOUNTER — Ambulatory Visit: Payer: Medicare Other | Admitting: Medical

## 2018-10-21 ENCOUNTER — Encounter: Payer: Self-pay | Admitting: Medical

## 2018-10-22 ENCOUNTER — Other Ambulatory Visit: Payer: Self-pay | Admitting: Medical

## 2018-10-22 ENCOUNTER — Encounter: Payer: Self-pay | Admitting: Medical

## 2018-10-22 ENCOUNTER — Other Ambulatory Visit: Payer: Self-pay

## 2018-10-22 ENCOUNTER — Ambulatory Visit (INDEPENDENT_AMBULATORY_CARE_PROVIDER_SITE_OTHER): Payer: Medicare Other | Admitting: Medical

## 2018-10-22 DIAGNOSIS — R1013 Epigastric pain: Secondary | ICD-10-CM | POA: Diagnosis not present

## 2018-10-22 MED ORDER — FAMOTIDINE 20 MG PO TABS: 20.0000 mg | ORAL_TABLET | Freq: Every day | ORAL | 0 refills | Status: DC

## 2018-10-22 NOTE — Patient Instructions (Signed)
Milder side abdomen discomfort which may represent h pylori infection or gerd. Will recommend probiotic, labs tomorrow and then start famotadine. Will follow labs and see how patient responds to treatment.  If pain worsens or changes consider imaging studies or referral to gi.  Advised healthy diet.  Follow up in 10-14 days or as needed.  Any severe change signs or symptoms then ED evaluation.

## 2018-10-22 NOTE — Progress Notes (Signed)
   Subjective:    Patient ID: Janet Peters, female    DOB: 1951-03-03, 68 y.o.   MRN: [id number]  HPI Virtual Visit via Telephone Note  I connected with [name] on 10/22/18 at  3:40 PM EDT by telephone and verified that I am speaking with the correct person using two identifiers.  Location: Patient: home Provider: home.   I discussed the limitations, risks, security and privacy concerns of performing an evaluation and management service by telephone and the availability of in person appointments. I also discussed with the patient that there may be a patient responsible charge related to this service. The patient expressed understanding and agreed to proceed.   History of Present Illness: Pt has 2 weeks of feeling of fullness. Even if eats small amount. Mild upset stomach. No significant back pain(only rare occasional brief low level). Pt does report occasional burping. No fever, no diarrhea. No nauseau,no vomiting and no burning.  Pt has been trying tea to help. No pain when she self palpates.  Pt has not been losing weight.     Observations/Objective: General - no acute distress, pleasant, oriented, normal speech. Abdomen- no pain during interview per pt.  Assessment and Plan: Milder side abdomen discomfort which may represent h pylori infection or gerd. Will recommend probiotic, labs tomorrow and then start famotadine. Will follow labs and see how patient responds to treatment.  If pain worsens or changes consider imaging studies or referral to gi.  Advised healthy diet.  Follow up in 10-14 days or as needed.  Any severe change signs or symptoms then ED evaluation.   Follow Up Instructions:    I discussed the assessment and treatment plan with the patient. The patient was provided an opportunity to ask questions and all were answered. The patient agreed with the plan and demonstrated an understanding of the instructions.   The patient was advised  to call back or seek an in-person evaluation if the symptoms worsen or if the condition fails to improve as anticipated.  I provided 15 minutes of non-face-to-face time during this encounter.   Mackie Pai, PA-C    Review of Systems  Constitutional: Negative for fatigue.  Respiratory: Negative for cough, chest tightness, shortness of breath and wheezing.   Cardiovascular: Negative for chest pain and palpitations.  Gastrointestinal: Positive for abdominal pain. Negative for abdominal distention, constipation, diarrhea, nausea, rectal pain and vomiting.  Endocrine: Negative for polydipsia, polyphagia and polyuria.  Genitourinary: Negative for dysuria.  Musculoskeletal: Negative for back pain, myalgias and neck pain.  Skin: Negative for pallor and rash.  Neurological: Negative for dizziness and headaches.  Hematological: Negative for adenopathy. Does not bruise/bleed easily.  Psychiatric/Behavioral: Negative for behavioral problems and decreased concentration.       Objective:   Physical Exam        Assessment & Plan:

## 2018-10-24 ENCOUNTER — Other Ambulatory Visit: Payer: Self-pay

## 2018-10-24 ENCOUNTER — Other Ambulatory Visit (INDEPENDENT_AMBULATORY_CARE_PROVIDER_SITE_OTHER): Payer: Medicare Other

## 2018-10-24 DIAGNOSIS — R1013 Epigastric pain: Secondary | ICD-10-CM | POA: Diagnosis not present

## 2018-10-25 LAB — COMPREHENSIVE METABOLIC PANEL
ALT: 20 U/L (ref 0–35)
AST: 20 U/L (ref 0–37)
Albumin: 4.4 g/dL (ref 3.5–5.2)
Alkaline Phosphatase: 95 U/L (ref 39–117)
BUN: 18 mg/dL (ref 6–23)
CO2: 24 mEq/L (ref 19–32)
Calcium: 9.8 mg/dL (ref 8.4–10.5)
Chloride: 105 mEq/L (ref 96–112)
Creatinine, Ser: 0.7 mg/dL (ref 0.40–1.20)
GFR: 83.16 mL/min (ref >60.00)
Glucose, Bld: 110 mg/dL — ABNORMAL HIGH (ref 70–99)
Potassium: 4.2 mEq/L (ref 3.5–5.1)
Sodium: 140 mEq/L (ref 135–145)
Total Bilirubin: 0.4 mg/dL (ref 0.2–1.2)
Total Protein: 7.6 g/dL (ref 6.0–8.3)

## 2018-10-25 LAB — CBC WITH DIFFERENTIAL/PLATELET
Basophils Absolute: 0.1 10*3/uL (ref 0.0–0.1)
Basophils Relative: 1 % (ref 0.0–3.0)
Eosinophils Absolute: 0.3 10*3/uL (ref 0.0–0.7)
Eosinophils Relative: 2.7 % (ref 0.0–5.0)
HCT: 40 % (ref 36.0–46.0)
Hemoglobin: 13.5 g/dL (ref 12.0–15.0)
Lymphocytes Relative: 37.7 % (ref 12.0–46.0)
Lymphs Abs: 4.1 10*3/uL — ABNORMAL HIGH (ref 0.7–4.0)
MCHC: 33.8 g/dL (ref 30.0–36.0)
MCV: 89.9 fl (ref 78.0–100.0)
Monocytes Absolute: 0.8 10*3/uL (ref 0.1–1.0)
Monocytes Relative: 7.7 % (ref 3.0–12.0)
Neutro Abs: 5.6 10*3/uL (ref 1.4–7.7)
Neutrophils Relative %: 50.9 % (ref 43.0–77.0)
Platelets: 336 10*3/uL (ref 150.0–400.0)
RBC: 4.45 Mil/uL (ref 3.87–5.11)
RDW: 13.1 % (ref 11.5–15.5)
WBC: 11 10*3/uL — ABNORMAL HIGH (ref 4.0–10.5)

## 2018-10-25 LAB — LIPASE: Lipase: 16 U/L (ref 11.0–59.0)

## 2018-10-25 LAB — H. PYLORI BREATH TEST: H. pylori Breath Test: DETECTED — AB

## 2018-10-26 ENCOUNTER — Telehealth: Payer: Self-pay | Admitting: Medical

## 2018-10-26 ENCOUNTER — Encounter: Payer: Self-pay | Admitting: Medical

## 2018-10-26 MED ORDER — OMEPRAZOLE 40 MG PO CPDR: 40.0000 mg | DELAYED_RELEASE_CAPSULE | Freq: Every day | ORAL | 3 refills | Status: DC

## 2018-10-26 MED ORDER — AMOXICILLIN 500 MG PO CAPS: 1000.0000 mg | ORAL_CAPSULE | Freq: Two times a day (BID) | ORAL | 0 refills | Status: DC

## 2018-10-26 MED ORDER — CLARITHROMYCIN ER 500 MG PO TB24: 1000.0000 mg | ORAL_TABLET | Freq: Every day | ORAL | 0 refills | Status: DC

## 2018-10-26 NOTE — Telephone Encounter (Signed)
RX antibiotic and ppi for h pylori infection.

## 2018-10-27 ENCOUNTER — Other Ambulatory Visit: Payer: Self-pay | Admitting: Medical

## 2018-10-28 ENCOUNTER — Encounter: Payer: Self-pay | Admitting: Medical

## 2018-10-29 ENCOUNTER — Other Ambulatory Visit: Payer: Medicare Other

## 2018-11-04 ENCOUNTER — Other Ambulatory Visit: Payer: Self-pay | Admitting: Medical

## 2018-11-04 DIAGNOSIS — E785 Hyperlipidemia, unspecified: Secondary | ICD-10-CM

## 2018-11-05 ENCOUNTER — Telehealth: Payer: Self-pay | Admitting: Cardiology

## 2018-11-05 ENCOUNTER — Ambulatory Visit: Payer: Medicare Other | Admitting: Cardiology

## 2018-11-05 NOTE — Telephone Encounter (Signed)
°*  STAT* If patient is at the pharmacy, call can be transferred to refill team.   1. Which medications need to be refilled? (please list name of each medication and dose if known) Metoprolol  2. Which pharmacy/location (including street and city if local pharmacy) is medication to be sent to? [facility]  3. Do they need a 30 day or 90 day supply? [place]

## 2018-11-06 ENCOUNTER — Other Ambulatory Visit: Payer: Self-pay | Admitting: *Deleted

## 2018-11-06 MED ORDER — METOPROLOL SUCCINATE ER 50 MG PO TB24: 50.0000 mg | ORAL_TABLET | Freq: Every day | ORAL | 1 refills | Status: DC

## 2018-11-06 NOTE — Telephone Encounter (Signed)
Refill sent.

## 2018-11-07 ENCOUNTER — Ambulatory Visit (INDEPENDENT_AMBULATORY_CARE_PROVIDER_SITE_OTHER): Payer: Medicare Other | Admitting: Cardiology

## 2018-11-07 ENCOUNTER — Encounter: Payer: Self-pay | Admitting: Cardiology

## 2018-11-07 ENCOUNTER — Other Ambulatory Visit: Payer: Self-pay

## 2018-11-07 VITALS — BP 132/82 | HR 97 | Ht 62.0 in | Wt 178.4 lb

## 2018-11-07 DIAGNOSIS — R079 Chest pain, unspecified: Secondary | ICD-10-CM

## 2018-11-07 DIAGNOSIS — E782 Mixed hyperlipidemia: Secondary | ICD-10-CM

## 2018-11-07 DIAGNOSIS — E088 Diabetes mellitus due to underlying condition with unspecified complications: Secondary | ICD-10-CM | POA: Diagnosis not present

## 2018-11-07 NOTE — Patient Instructions (Signed)
Medication Instructions:  Your physician recommends that you continue on your current medications as directed. Please refer to the Current Medication list given to you today.  If you need a refill on your cardiac medications before your next appointment, please call your pharmacy.   Lab work: NONE If you have labs (blood work) drawn today and your tests are completely normal, you will receive your results only by: . MyChart Message (if you have MyChart) OR . A paper copy in the mail If you have any lab test that is abnormal or we need to change your treatment, we will call you to review the results.  Testing/Procedures: You had an EKG performed today  Follow-Up: At [facility], you and your health needs are our priority.  As part of our continuing mission to provide you with exceptional heart care, we have created designated Provider Care Teams.  These Care Teams include your primary Cardiologist (physician) and Advanced Practice Providers (APPs -  Physician Assistants and Nurse Practitioners) who all work together to provide you with the care you need, when you need it. You will need a follow up appointment in 6 months.   

## 2018-11-07 NOTE — Progress Notes (Signed)
Cardiology Office Note:    Date:  11/07/2018   ID:  Janet Peters, DOB September 18, 1950, MRN [id number]  PCP:  Mackie Pai, PA-C  Cardiologist:  Jenean Lindau, MD   Referring MD: Mackie Pai, PA-C    ASSESSMENT:    1. Chest pain, unspecified type   2. Diabetes mellitus due to underlying condition with unspecified complications ([place])   3. Mixed dyslipidemia    PLAN:    In order of problems listed above:  1. Chest discomfort: This is resolved and the patient has excellent tolerance walking up an hour a day without any problems. 2. Essential hypertension: Blood pressure stable 3. Mixed dyslipidemia: Diet was discussed.  Lipids were reviewed. 4. Patient will be seen in follow-up appointment in 6 months or earlier if the patient has any concerns    Medication Adjustments/Labs and Tests Ordered: Current medicines are reviewed at length with the patient today.  Concerns regarding medicines are outlined above.  Orders Placed This Encounter  Procedures  . EKG 12-Lead   No orders of the defined types were placed in this encounter.    No chief complaint on file.    History of Present Illness:    Janet Peters is a 68 y.o. female and has past medical history of essential hypertension and dyslipidemia.  She denies any problems at this time.  She takes care of activities of daily living.  No chest pain orthopnea or PND.  She mentions to me that she walks an hour a day without any problems.  At the time of my evaluation, the patient is alert awake oriented and in no distress. Past Medical History:  Diagnosis Date  . Diabetes mellitus without complication ([place])   . Hyperlipidemia   . Osteopenia 10/2016   T score -1.3 FRAX 3.5%/0.1%    Past Surgical History:  Procedure Laterality Date  . ABDOMINAL HYSTERECTOMY    . CATARACT EXTRACTION Left   . CESAREAN SECTION     x2  . CHOLECYSTECTOMY, LAPAROSCOPIC    . KNEE SURGERY Left 2011    Current Medications:  Current Meds  Medication Sig  . ACCU-CHEK AVIVA PLUS test strip USE TWICE DAILY AS DIRECTED  . Accu-Chek Softclix Lancets lancets TEST TWICE DAILY  . amoxicillin (AMOXIL) 500 MG capsule Take 2 capsules (1,000 mg total) by mouth 2 (two) times daily.  Marland Kitchen atorvastatin (LIPITOR) 10 MG tablet TAKE 1 TABLET BY MOUTH DAILY  . Blood Glucose Calibration (ACCU-CHEK COMPACT PLUS CONTROL) SOLN 1 each by In Vitro route daily as needed.  . busPIRone (BUSPAR) 7.5 MG tablet TAKE 1 TABLET(7.5 MG) BY MOUTH TWICE DAILY  . Cholecalciferol (VITAMIN D3) 5000 units CAPS Take by mouth.  Marland Kitchen CINNAMON PO Take by mouth.  . clarithromycin (BIAXIN XL) 500 MG 24 hr tablet Take 2 tablets (1,000 mg total) by mouth daily.  . famotidine (PEPCID) 20 MG tablet TAKE 1 TABLET(20 MG) BY MOUTH DAILY  . fluticasone (FLONASE) 50 MCG/ACT nasal spray SHAKE LIQUID AND USE 2 SPRAYS IN EACH NOSTRIL DAILY  . metFORMIN (GLUCOPHAGE) 500 MG tablet Take 1.5 tablets (750 mg total) by mouth daily with breakfast.  . metoprolol succinate (TOPROL-XL) 50 MG 24 hr tablet Take 1 tablet (50 mg total) by mouth daily. Take with or immediately following a meal.  . omeprazole (PRILOSEC) 40 MG capsule Take 1 capsule (40 mg total) by mouth daily.     Allergies:   Patient has no known allergies.   Social History   Socioeconomic History  .  Marital status: Married    Spouse name: Not on file  . Number of children: Not on file  . Years of education: Not on file  . Highest education level: Not on file  Occupational History  . Not on file  Social Needs  . Financial resource strain: Not on file  . Food insecurity    Worry: Not on file    Inability: Not on file  . Transportation needs    Medical: Not on file    Non-medical: Not on file  Tobacco Use  . Smoking status: Never Smoker  . Smokeless tobacco: Never Used  Substance and Sexual Activity  . Alcohol use: No  . Drug use: No  . Sexual activity: Yes    Partners: Male    Comment: 1st intercourse-  59, partners - 2   Lifestyle  . Physical activity    Days per week: Not on file    Minutes per session: Not on file  . Stress: Not on file  Relationships  . Social Herbalist on phone: Not on file    Gets together: Not on file    Attends religious service: Not on file    Active member of club or organization: Not on file    Attends meetings of clubs or organizations: Not on file    Relationship status: Not on file  Other Topics Concern  . Not on file  Social History Narrative  . Not on file     Family History: The patient's family history includes Heart attack in her maternal grandmother.  ROS:   Please see the history of present illness.    All other systems reviewed and are negative.  EKGs/Labs/Other Studies Reviewed:    The following studies were reviewed today: EKG, stress test and echocardiogram report were discussed with her at extensive length and she is happy about it   Recent Labs: 10/24/2018: ALT 20; BUN 18; Creatinine, Ser 0.70; Hemoglobin 13.5; Platelets 336.0; Potassium 4.2; Sodium 140  Recent Lipid Panel    Component Value Date/Time   CHOL 168 10/01/2018 0833   CHOL 153 01/01/2018 0925   TRIG 237.0 (H) 10/01/2018 0833   HDL 49.10 10/01/2018 0833   HDL 55 01/01/2018 0925   CHOLHDL 3 10/01/2018 0833   VLDL 47.4 (H) 10/01/2018 0833   LDLCALC 78 05/29/2018 0839   LDLCALC 67 01/01/2018 0925   LDLDIRECT 92.0 10/01/2018 0833    Physical Exam:    VS:  BP 132/82 (BP Location: Left Arm, Patient Position: Sitting)   Pulse 97   Ht 5\' 2"  (1.575 m)   Wt 178 lb 6.4 oz (80.9 kg)   SpO2 99%   BMI 32.63 kg/m     Wt Readings from Last 3 Encounters:  11/07/18 178 lb 6.4 oz (80.9 kg)  10/10/18 178 lb (80.7 kg)  10/01/18 177 lb 6.4 oz (80.5 kg)     GEN: Patient is in no acute distress HEENT: Normal NECK: No JVD; No carotid bruits LYMPHATICS: No lymphadenopathy CARDIAC: Hear sounds regular, 2/6 systolic murmur at the apex. RESPIRATORY:  Clear to  auscultation without rales, wheezing or rhonchi  ABDOMEN: Soft, non-tender, non-distended MUSCULOSKELETAL:  No edema; No deformity  SKIN: Warm and dry NEUROLOGIC:  Alert and oriented x 3 PSYCHIATRIC:  Normal affect   Signed, Jenean Lindau, MD  11/07/2018 12:22 PM    [facility]

## 2018-11-26 ENCOUNTER — Other Ambulatory Visit: Payer: Self-pay | Admitting: Medical

## 2018-11-29 ENCOUNTER — Other Ambulatory Visit: Payer: Self-pay

## 2018-11-30 ENCOUNTER — Ambulatory Visit (INDEPENDENT_AMBULATORY_CARE_PROVIDER_SITE_OTHER): Payer: Medicare Other

## 2018-11-30 DIAGNOSIS — Z23 Encounter for immunization: Secondary | ICD-10-CM

## 2018-12-03 ENCOUNTER — Ambulatory Visit (INDEPENDENT_AMBULATORY_CARE_PROVIDER_SITE_OTHER): Payer: Medicare Other

## 2018-12-03 ENCOUNTER — Other Ambulatory Visit: Payer: Self-pay

## 2018-12-03 ENCOUNTER — Other Ambulatory Visit: Payer: Self-pay | Admitting: Obstetrics & Gynecology

## 2018-12-03 DIAGNOSIS — Z780 Asymptomatic menopausal state: Secondary | ICD-10-CM

## 2018-12-03 DIAGNOSIS — M8588 Other specified disorders of bone density and structure, other site: Secondary | ICD-10-CM

## 2018-12-03 DIAGNOSIS — Z13820 Encounter for screening for osteoporosis: Secondary | ICD-10-CM

## 2018-12-26 ENCOUNTER — Telehealth: Payer: Self-pay | Admitting: *Deleted

## 2018-12-26 MED ORDER — BUSPIRONE HCL 7.5 MG PO TABS: ORAL_TABLET | ORAL | 0 refills | Status: DC

## 2018-12-26 NOTE — Telephone Encounter (Signed)
Received fax from [facility] requesting refill of Buspirone 7.5mg  twice daily.  Please advise?  Last RX: 11/27/18, #60 x no refills Last OV: 10/22/18 acute Next OV: none scheduled

## 2018-12-26 NOTE — Telephone Encounter (Signed)
Rx sent to pharmacy   

## 2018-12-26 NOTE — Telephone Encounter (Signed)
Thanks for sending refill.

## 2019-01-01 ENCOUNTER — Encounter: Payer: Self-pay | Admitting: Gynecology

## 2019-01-31 ENCOUNTER — Other Ambulatory Visit: Payer: Self-pay

## 2019-02-03 ENCOUNTER — Encounter: Payer: Self-pay | Admitting: Medical

## 2019-02-03 ENCOUNTER — Other Ambulatory Visit: Payer: Self-pay

## 2019-02-03 ENCOUNTER — Ambulatory Visit (INDEPENDENT_AMBULATORY_CARE_PROVIDER_SITE_OTHER): Payer: Medicare Other | Admitting: Medical

## 2019-02-03 VITALS — BP 135/85 | HR 106 | Temp 97.6°F | Resp 16 | Ht 62.0 in | Wt 177.8 lb

## 2019-02-03 DIAGNOSIS — J309 Allergic rhinitis, unspecified: Secondary | ICD-10-CM

## 2019-02-03 DIAGNOSIS — I10 Essential (primary) hypertension: Secondary | ICD-10-CM | POA: Diagnosis not present

## 2019-02-03 DIAGNOSIS — Z794 Long term (current) use of insulin: Secondary | ICD-10-CM

## 2019-02-03 DIAGNOSIS — S46811A Strain of other muscles, fascia and tendons at shoulder and upper arm level, right arm, initial encounter: Secondary | ICD-10-CM

## 2019-02-03 DIAGNOSIS — E118 Type 2 diabetes mellitus with unspecified complications: Secondary | ICD-10-CM

## 2019-02-03 DIAGNOSIS — E785 Hyperlipidemia, unspecified: Secondary | ICD-10-CM | POA: Diagnosis not present

## 2019-02-03 LAB — CBC WITH DIFFERENTIAL/PLATELET
Basophils Absolute: 0 10*3/uL (ref 0.0–0.1)
Basophils Relative: 0.5 % (ref 0.0–3.0)
Eosinophils Absolute: 0.3 10*3/uL (ref 0.0–0.7)
Eosinophils Relative: 4.1 % (ref 0.0–5.0)
HCT: 40 % (ref 36.0–46.0)
Hemoglobin: 13.2 g/dL (ref 12.0–15.0)
Lymphocytes Relative: 30.1 % (ref 12.0–46.0)
Lymphs Abs: 2.5 10*3/uL (ref 0.7–4.0)
MCHC: 33.1 g/dL (ref 30.0–36.0)
MCV: 87.7 fl (ref 78.0–100.0)
Monocytes Absolute: 0.5 10*3/uL (ref 0.1–1.0)
Monocytes Relative: 6.1 % (ref 3.0–12.0)
Neutro Abs: 5 10*3/uL (ref 1.4–7.7)
Neutrophils Relative %: 59.2 % (ref 43.0–77.0)
Platelets: 343 10*3/uL (ref 150.0–400.0)
RBC: 4.56 Mil/uL (ref 3.87–5.11)
RDW: 13.2 % (ref 11.5–15.5)
WBC: 8.4 10*3/uL (ref 4.0–10.5)

## 2019-02-03 LAB — COMPREHENSIVE METABOLIC PANEL
ALT: 20 U/L (ref 0–35)
AST: 17 U/L (ref 0–37)
Albumin: 4.3 g/dL (ref 3.5–5.2)
Alkaline Phosphatase: 118 U/L — ABNORMAL HIGH (ref 39–117)
BUN: 14 mg/dL (ref 6–23)
CO2: 24 mEq/L (ref 19–32)
Calcium: 9.6 mg/dL (ref 8.4–10.5)
Chloride: 104 mEq/L (ref 96–112)
Creatinine, Ser: 0.62 mg/dL (ref 0.40–1.20)
GFR: 95.59 mL/min (ref >60.00)
Glucose, Bld: 130 mg/dL — ABNORMAL HIGH (ref 70–99)
Potassium: 4.2 mEq/L (ref 3.5–5.1)
Sodium: 139 mEq/L (ref 135–145)
Total Bilirubin: 0.3 mg/dL (ref 0.2–1.2)
Total Protein: 7.3 g/dL (ref 6.0–8.3)

## 2019-02-03 LAB — LIPID PANEL
Cholesterol: 175 mg/dL (ref 0–200)
HDL: 48.6 mg/dL (ref >39.00)
NonHDL: 126.38
Total CHOL/HDL Ratio: 4
Triglycerides: 238 mg/dL — ABNORMAL HIGH (ref 0.0–149.0)
VLDL: 47.6 mg/dL — ABNORMAL HIGH (ref 0.0–40.0)

## 2019-02-03 LAB — HEMOGLOBIN A1C: Hgb A1c MFr Bld: 7 % — ABNORMAL HIGH (ref 4.6–6.5)

## 2019-02-03 LAB — LDL CHOLESTEROL, DIRECT: Direct LDL: 94 mg/dL

## 2019-02-03 MED ORDER — FLUTICASONE PROPIONATE 50 MCG/ACT NA SUSP: 2.0000 | Freq: Every day | NASAL | 1 refills | Status: DC

## 2019-02-03 MED ORDER — CYCLOBENZAPRINE HCL 5 MG PO TABS: 5.0000 mg | ORAL_TABLET | Freq: Every day | ORAL | 0 refills | Status: DC

## 2019-02-03 NOTE — Progress Notes (Signed)
Subjective:    Patient ID: Janet Peters, female    DOB: Aug 24, 1950, 68 y.o.   MRN: [id number]  HPI  Pt is in for follow up.    Pt has hx of high cholesterol.. 4 months ago cholesterol number increased.  Pt also has diabetes. Her last a1-c was 6.8  4 months ago.  Pt describes trapezius tenderness for about 2 weeks. Pt thinks related to sleeping with too many pillows. Pain is described as 5/10 at most but most time less. Pain rt side trapezius. No mid c-spine pain. No radicular pain.  Faint occipital ha as well.  Then describes minimal nasal congestion. But no cough, no fever, no myalgia, no diarrhea, no loss of smell and no diarrhea. Not fatigued.  Pt last colonoscopy was in 2011. Pt had diverticulosis. Pt report states repeat 5 years. She did not but wanted to now wait until 2021.   Review of Systems  Constitutional: Negative for chills, fatigue and fever.  HENT: Negative for congestion and ear pain.   Respiratory: Negative for cough, chest tightness and wheezing.   Cardiovascular: Negative for chest pain and palpitations.  Gastrointestinal: Negative for abdominal pain.  Musculoskeletal: Positive for neck pain. Negative for arthralgias and neck stiffness.  Neurological: Negative for dizziness, tremors, speech difficulty, weakness and light-headedness.  Hematological: Negative for adenopathy. Does not bruise/bleed easily.  Psychiatric/Behavioral: Negative for behavioral problems, confusion, sleep disturbance and suicidal ideas. The patient is not nervous/anxious.     Past Medical History:  Diagnosis Date  . Diabetes mellitus without complication ([place])   . Hyperlipidemia   . Osteopenia 10/2016   T score -1.3 FRAX 3.5%/0.1%     Social History   Socioeconomic History  . Marital status: Married    Spouse name: Not on file  . Number of children: Not on file  . Years of education: Not on file  . Highest education level: Not on file  Occupational History  . Not  on file  Social Needs  . Financial resource strain: Not on file  . Food insecurity    Worry: Not on file    Inability: Not on file  . Transportation needs    Medical: Not on file    Non-medical: Not on file  Tobacco Use  . Smoking status: Never Smoker  . Smokeless tobacco: Never Used  Substance and Sexual Activity  . Alcohol use: No  . Drug use: No  . Sexual activity: Yes    Partners: Male    Comment: 1st intercourse- 87, partners - 2   Lifestyle  . Physical activity    Days per week: Not on file    Minutes per session: Not on file  . Stress: Not on file  Relationships  . Social Herbalist on phone: Not on file    Gets together: Not on file    Attends religious service: Not on file    Active member of club or organization: Not on file    Attends meetings of clubs or organizations: Not on file    Relationship status: Not on file  . Intimate partner violence    Fear of current or ex partner: Not on file    Emotionally abused: Not on file    Physically abused: Not on file    Forced sexual activity: Not on file  Other Topics Concern  . Not on file  Social History Narrative  . Not on file    Past Surgical History:  Procedure  Laterality Date  . ABDOMINAL HYSTERECTOMY    . CATARACT EXTRACTION Left   . CESAREAN SECTION     x2  . CHOLECYSTECTOMY, LAPAROSCOPIC    . KNEE SURGERY Left 2011    Family History  Problem Relation Age of Onset  . Heart attack Maternal Grandmother     No Known Allergies  Current Outpatient Medications on File Prior to Visit  Medication Sig Dispense Refill  . ACCU-CHEK AVIVA PLUS test strip USE TWICE DAILY AS DIRECTED 100 each 11  . Accu-Chek Softclix Lancets lancets TEST TWICE DAILY 200 each 0  . atorvastatin (LIPITOR) 10 MG tablet TAKE 1 TABLET BY MOUTH DAILY 90 tablet 1  . Blood Glucose Calibration (ACCU-CHEK COMPACT PLUS CONTROL) SOLN 1 each by In Vitro route daily as needed. 1 each 1  . busPIRone (BUSPAR) 7.5 MG tablet TAKE  1 TABLET(7.5 MG) BY MOUTH TWICE DAILY 60 tablet 0  . Cholecalciferol (VITAMIN D3) 5000 units CAPS Take by mouth.    Marland Kitchen CINNAMON PO Take by mouth.    . clarithromycin (BIAXIN XL) 500 MG 24 hr tablet Take 2 tablets (1,000 mg total) by mouth daily. 20 tablet 0  . famotidine (PEPCID) 20 MG tablet TAKE 1 TABLET(20 MG) BY MOUTH DAILY 90 tablet 0  . fluticasone (FLONASE) 50 MCG/ACT nasal spray SHAKE LIQUID AND USE 2 SPRAYS IN EACH NOSTRIL DAILY 16 g 0  . metFORMIN (GLUCOPHAGE) 500 MG tablet Take 1.5 tablets (750 mg total) by mouth daily with breakfast. 135 tablet 1  . metoprolol succinate (TOPROL-XL) 50 MG 24 hr tablet Take 1 tablet (50 mg total) by mouth daily. Take with or immediately following a meal. 90 tablet 1  . omeprazole (PRILOSEC) 40 MG capsule Take 1 capsule (40 mg total) by mouth daily. 30 capsule 3   No current facility-administered medications on file prior to visit.     BP (!) 153/85   Pulse (!) 106   Temp 97.6 F (36.4 C) (Temporal)   Resp 16   Ht 5\' 2"  (1.575 m)   Wt 177 lb 12.8 oz (80.6 kg)   SpO2 99%   BMI 32.52 kg/m       Objective:   Physical Exam   General  Mental Status - Alert. General Appearance - Well groomed. Not in acute distress.  Skin Rashes- No Rashes.  HEENT Head- Normal. Ear Auditory Canal - Left- Normal. Right - Normal.Tympanic Membrane- Left- Normal. Right- Normal. Eye Sclera/Conjunctiva- Left- Normal. Right- Normal.   Nose & Sinuses Nasal Mucosa- Left-  Boggy and Congested. Right-  Boggy and  Congested.Bilateral maxillary and frontal sinus pressure.   Neck Neck- Supple. No Masses.   Chest and Lung Exam Auscultation: Breath Sounds:-Clear even and unlabored.  Cardiovascular Auscultation:Rythm- Regular, rate and rhythm. Murmurs & Other Heart Sounds:Ausculatation of the heart reveal- No Murmurs.  Lymphatic Head & Neck General Head & Neck Lymphatics: Bilateral: Description- No Localized lymphadenopathy.  Lower ext- see quality  metrics.  Skin- no worrisome lesions.     Assessment & Plan:  Your bp is adequately controlled today continue b blocker.  For high cholesterol will get fasting lipid panel.  For diabetes, placed cmp and a1c today.  For trapezius pain, I am prescribing flexeril. You may have tension ha. Want to see if flexeril resolves symptoms. Use at night only. Update me if this helps. If any mid neck pain the get cervical spine xay.  For hx of allergies and faint nasal congestion, I rx'd flonase  Follow up  date to be determined after lab review.  25 minutes spent with pt. 50% of time spent counseling pt on plan going forward.  Mackie Pai, PA-C

## 2019-02-03 NOTE — Patient Instructions (Signed)
Your bp is adequately controlled today continue b blocker.  For high cholesterol will get fasting lipid panel.  For diabetes, placed cmp and a1c today.  For trapezius pain, I am prescribing flexeril. You may have tension ha. Want to see if flexeril resolves symptoms. Use at night only. Update me if this helps. If any mid neck pain the get cervical spine xay.  For hx of allergies and faint nasal congestion, I rx'd flonase  Follow up date to be determined after lab review.

## 2019-02-04 ENCOUNTER — Telehealth: Payer: Self-pay | Admitting: Medical

## 2019-02-04 MED ORDER — METFORMIN HCL 500 MG PO TABS: 500.0000 mg | ORAL_TABLET | Freq: Two times a day (BID) | ORAL | 3 refills | Status: DC

## 2019-02-04 NOTE — Telephone Encounter (Signed)
Rx metformin sent to pt pharmacy. 

## 2019-02-07 ENCOUNTER — Other Ambulatory Visit: Payer: Self-pay

## 2019-02-07 MED ORDER — BUSPIRONE HCL 7.5 MG PO TABS: ORAL_TABLET | ORAL | 0 refills | Status: DC

## 2019-02-25 ENCOUNTER — Other Ambulatory Visit: Payer: Self-pay

## 2019-02-25 MED ORDER — BUSPIRONE HCL 7.5 MG PO TABS: ORAL_TABLET | ORAL | 0 refills | Status: DC

## 2019-03-12 ENCOUNTER — Other Ambulatory Visit: Payer: Self-pay | Admitting: *Deleted

## 2019-03-12 DIAGNOSIS — E118 Type 2 diabetes mellitus with unspecified complications: Secondary | ICD-10-CM

## 2019-03-12 MED ORDER — ACCU-CHEK AVIVA PLUS VI STRP: ORAL_STRIP | 1 refills | Status: DC

## 2019-03-26 ENCOUNTER — Other Ambulatory Visit: Payer: Self-pay | Admitting: Medical

## 2019-04-09 ENCOUNTER — Other Ambulatory Visit (HOSPITAL_BASED_OUTPATIENT_CLINIC_OR_DEPARTMENT_OTHER): Payer: Self-pay | Admitting: Medical

## 2019-04-09 DIAGNOSIS — Z1231 Encounter for screening mammogram for malignant neoplasm of breast: Secondary | ICD-10-CM

## 2019-05-15 ENCOUNTER — Ambulatory Visit (HOSPITAL_BASED_OUTPATIENT_CLINIC_OR_DEPARTMENT_OTHER): Payer: Medicare Other

## 2019-05-22 ENCOUNTER — Other Ambulatory Visit: Payer: Self-pay

## 2019-05-22 ENCOUNTER — Ambulatory Visit (HOSPITAL_BASED_OUTPATIENT_CLINIC_OR_DEPARTMENT_OTHER)
Admission: RE | Admit: 2019-05-22 | Discharge: 2019-05-22 | Disposition: A | Discharge status: Home / Self Care | Payer: Medicare Other | Source: Ambulatory Visit | Attending: Medical | Admitting: Medical

## 2019-05-22 DIAGNOSIS — Z1231 Encounter for screening mammogram for malignant neoplasm of breast: Secondary | ICD-10-CM

## 2019-05-25 ENCOUNTER — Other Ambulatory Visit: Payer: Self-pay | Admitting: Medical

## 2019-05-26 ENCOUNTER — Encounter: Payer: Self-pay | Admitting: Medical

## 2019-05-28 ENCOUNTER — Encounter: Payer: Self-pay | Admitting: Cardiology

## 2019-05-28 ENCOUNTER — Ambulatory Visit (INDEPENDENT_AMBULATORY_CARE_PROVIDER_SITE_OTHER): Payer: Medicare Other | Admitting: Cardiology

## 2019-05-28 ENCOUNTER — Other Ambulatory Visit: Payer: Self-pay

## 2019-05-28 VITALS — BP 140/84 | HR 102 | Temp 97.9°F | Resp 20 | Ht 62.0 in | Wt 177.5 lb

## 2019-05-28 DIAGNOSIS — Z1329 Encounter for screening for other suspected endocrine disorder: Secondary | ICD-10-CM | POA: Diagnosis not present

## 2019-05-28 DIAGNOSIS — E782 Mixed hyperlipidemia: Secondary | ICD-10-CM

## 2019-05-28 DIAGNOSIS — E088 Diabetes mellitus due to underlying condition with unspecified complications: Secondary | ICD-10-CM | POA: Diagnosis not present

## 2019-05-28 NOTE — Progress Notes (Signed)
]\  Cardiology Office Note:    Date:  05/28/2019   ID:  Janet Peters, DOB 10/26/1950, MRN [id number]  PCP:  Mackie Pai, PA-C  Cardiologist:  Jenean Lindau, MD   Referring MD: Mackie Pai, PA-C    ASSESSMENT:    1. Mixed dyslipidemia   2. Diabetes mellitus due to underlying condition with unspecified complications ([place])    PLAN:    In order of problems listed above:  1. Chest discomfort: This is resolved.  She leads a sedentary lifestyle.  She does not exercise on a regular basis partly because of orthopedic problems but promises to do better.  2. Essential hypertension: Blood pressure stable and diet was emphasized.  She tells me that this is whitecoat hypertension and she does not want to be medicated she will do lifestyle modification and dietary intervention. 3. Mixed dyslipidemia and diabetes mellitus: Diet was discussed importance of regular exercise stressed and weight reduction stressed.  She promises to comply.  We will do all blood work today as she is fasting.  We will also do a hemoglobin A1c and send copy to primary care physician. 4. Patient will be seen in follow-up appointment in 6 months or earlier if the patient has any concerns    Medication Adjustments/Labs and Tests Ordered: Current medicines are reviewed at length with the patient today.  Concerns regarding medicines are outlined above.  No orders of the defined types were placed in this encounter.  No orders of the defined types were placed in this encounter.    Chief Complaint  Patient presents with  . Follow-up    6 month FU. Pt states that she has back pain that at times goes into the chest as a sharp pain.     History of Present Illness:    Janet Peters is a 69 y.o. female.  Patient has past medical history of diabetes mellitus and dyslipidemia.  She was evaluated for chest discomfort.  This has resolved.  Her stress test was unremarkable.  She is here for routine  follow-up.  She denies any chest pain orthopnea or PND.  At the time of my evaluation, the patient is alert awake oriented and in no distress.  Past Medical History:  Diagnosis Date  . Diabetes mellitus without complication ([place])   . Hyperlipidemia   . Osteopenia 10/2016   T score -1.3 FRAX 3.5%/0.1%    Past Surgical History:  Procedure Laterality Date  . ABDOMINAL HYSTERECTOMY    . CATARACT EXTRACTION Left   . CESAREAN SECTION     x2  . CHOLECYSTECTOMY, LAPAROSCOPIC    . KNEE SURGERY Left 2011    Current Medications: No outpatient medications have been marked as taking for the 05/28/19 encounter (Office Visit) with Muaaz Brau, Reita Cliche, MD.     Allergies:   Patient has no known allergies.   Social History   Socioeconomic History  . Marital status: Married    Spouse name: Not on file  . Number of children: Not on file  . Years of education: Not on file  . Highest education level: Not on file  Occupational History  . Not on file  Tobacco Use  . Smoking status: Never Smoker  . Smokeless tobacco: Never Used  Substance and Sexual Activity  . Alcohol use: No  . Drug use: No  . Sexual activity: Yes    Partners: Male    Comment: 1st intercourse- 21, partners - 2   Other Topics Concern  .  Not on file  Social History Narrative  . Not on file   Social Determinants of Health   Financial Resource Strain:   . Difficulty of Paying Living Expenses: Not on file  Food Insecurity:   . Worried About Charity fundraiser in the Last Year: Not on file  . Ran Out of Food in the Last Year: Not on file  Transportation Needs:   . Lack of Transportation (Medical): Not on file  . Lack of Transportation (Non-Medical): Not on file  Physical Activity:   . Days of Exercise per Week: Not on file  . Minutes of Exercise per Session: Not on file  Stress:   . Feeling of Stress : Not on file  Social Connections:   . Frequency of Communication with Friends and Family: Not on file  . Frequency  of Social Gatherings with Friends and Family: Not on file  . Attends Religious Services: Not on file  . Active Member of Clubs or Organizations: Not on file  . Attends Archivist Meetings: Not on file  . Marital Status: Not on file     Family History: The patient's family history includes Heart attack in her maternal grandmother.  ROS:   Please see the history of present illness.    All other systems reviewed and are negative.  EKGs/Labs/Other Studies Reviewed:    The following studies were reviewed today: EKG reveals sinus rhythm and nonspecific ST-T changes   Recent Labs: 02/03/2019: ALT 20; BUN 14; Creatinine, Ser 0.62; Hemoglobin 13.2; Platelets 343.0; Potassium 4.2; Sodium 139  Recent Lipid Panel    Component Value Date/Time   CHOL 175 02/03/2019 0909   CHOL 153 01/01/2018 0925   TRIG 238.0 (H) 02/03/2019 0909   HDL 48.60 02/03/2019 0909   HDL 55 01/01/2018 0925   CHOLHDL 4 02/03/2019 0909   VLDL 47.6 (H) 02/03/2019 0909   LDLCALC 78 05/29/2018 0839   LDLCALC 67 01/01/2018 0925   LDLDIRECT 94.0 02/03/2019 0909    Physical Exam:    VS:  BP 140/84 (BP Location: Left Arm, Patient Position: Sitting, Cuff Size: Normal)   Pulse (!) 102   Temp 97.9 F (36.6 C)   Resp 20   Ht 5\' 2"  (1.575 m)   Wt 177 lb 8 oz (80.5 kg)   SpO2 98%   BMI 32.47 kg/m     Wt Readings from Last 3 Encounters:  05/28/19 177 lb 8 oz (80.5 kg)  02/03/19 177 lb 12.8 oz (80.6 kg)  11/07/18 178 lb 6.4 oz (80.9 kg)     GEN: Patient is in no acute distress HEENT: Normal NECK: No JVD; No carotid bruits LYMPHATICS: No lymphadenopathy CARDIAC: Hear sounds regular, 2/6 systolic murmur at the apex. RESPIRATORY:  Clear to auscultation without rales, wheezing or rhonchi  ABDOMEN: Soft, non-tender, non-distended MUSCULOSKELETAL:  No edema; No deformity  SKIN: Warm and dry NEUROLOGIC:  Alert and oriented x 3 PSYCHIATRIC:  Normal affect   Signed, Jenean Lindau, MD  05/28/2019 9:09  AM    [place]

## 2019-05-28 NOTE — Telephone Encounter (Signed)
Patient had appointment scheduled for 06-04-2019, appointment moved to 05-30-2019

## 2019-05-28 NOTE — Patient Instructions (Signed)
Medication Instructions:  No medication changes *If you need a refill on your cardiac medications before your next appointment, please call your pharmacy*   Lab Work: You had a BMET, CBC, TSH, LFT, Lipids and A1C today. If you have labs (blood work) drawn today and your tests are completely normal, you will receive your results only by: Marland Kitchen MyChart Message (if you have MyChart) OR . A paper copy in the mail If you have any lab test that is abnormal or we need to change your treatment, we will call you to review the results.   Testing/Procedures: None ordered   Follow-Up: At [facility], you and your health needs are our priority.  As part of our continuing mission to provide you with exceptional heart care, we have created designated Provider Care Teams.  These Care Teams include your primary Cardiologist (physician) and Advanced Practice Providers (APPs -  Physician Assistants and Nurse Practitioners) who all work together to provide you with the care you need, when you need it.  We recommend signing up for the patient portal called "MyChart".  Sign up information is provided on this After Visit Summary.  MyChart is used to connect with patients for Virtual Visits (Telemedicine).  Patients are able to view lab/test results, encounter notes, upcoming appointments, etc.  Non-urgent messages can be sent to your provider as well.   To learn more about what you can do with MyChart, go to [email or web address].    Your next appointment:   6 month(s)  The format for your next appointment:   In Person  Provider:   Jyl Heinz, MD   Other Instructions NA

## 2019-05-28 NOTE — Addendum Note (Signed)
Addended by: Truddie Hidden on: 05/28/2019 09:23 AM   Modules accepted: Orders

## 2019-05-29 LAB — HEPATIC FUNCTION PANEL
ALT: 22 IU/L (ref 0–32)
AST: 20 IU/L (ref 0–40)
Albumin: 4.7 g/dL (ref 3.8–4.8)
Alkaline Phosphatase: 128 IU/L — ABNORMAL HIGH (ref 39–117)
Bilirubin Total: 0.2 mg/dL (ref 0.0–1.2)
Bilirubin, Direct: 0.08 mg/dL (ref 0.00–0.40)
Total Protein: 7.6 g/dL (ref 6.0–8.5)

## 2019-05-29 LAB — BASIC METABOLIC PANEL
BUN/Creatinine Ratio: 15 (ref 12–28)
BUN: 10 mg/dL (ref 8–27)
CO2: 20 mmol/L (ref 20–29)
Calcium: 9.6 mg/dL (ref 8.7–10.3)
Chloride: 104 mmol/L (ref 96–106)
Creatinine, Ser: 0.68 mg/dL (ref 0.57–1.00)
GFR calc Af Amer: 104 mL/min/{1.73_m2} (ref >59)
GFR calc non Af Amer: 90 mL/min/{1.73_m2} (ref >59)
Glucose: 103 mg/dL — ABNORMAL HIGH (ref 65–99)
Potassium: 4.3 mmol/L (ref 3.5–5.2)
Sodium: 141 mmol/L (ref 134–144)

## 2019-05-29 LAB — TSH: TSH: 4.08 u[IU]/mL (ref 0.450–4.500)

## 2019-05-29 LAB — CBC WITH DIFFERENTIAL/PLATELET
Basophils Absolute: 0.1 10*3/uL (ref 0.0–0.2)
Basos: 1 %
EOS (ABSOLUTE): 0.3 10*3/uL (ref 0.0–0.4)
Eos: 3 %
Hematocrit: 39.5 % (ref 34.0–46.6)
Hemoglobin: 13.2 g/dL (ref 11.1–15.9)
Immature Grans (Abs): 0 10*3/uL (ref 0.0–0.1)
Immature Granulocytes: 0 %
Lymphocytes Absolute: 2.6 10*3/uL (ref 0.7–3.1)
Lymphs: 30 %
MCH: 29.1 pg (ref 26.6–33.0)
MCHC: 33.4 g/dL (ref 31.5–35.7)
MCV: 87 fL (ref 79–97)
Monocytes Absolute: 0.6 10*3/uL (ref 0.1–0.9)
Monocytes: 7 %
Neutrophils Absolute: 5 10*3/uL (ref 1.4–7.0)
Neutrophils: 59 %
Platelets: 364 10*3/uL (ref 150–450)
RBC: 4.53 x10E6/uL (ref 3.77–5.28)
RDW: 13.3 % (ref 11.7–15.4)
WBC: 8.6 10*3/uL (ref 3.4–10.8)

## 2019-05-29 LAB — LIPID PANEL
Chol/HDL Ratio: 3.3 ratio (ref 0.0–4.4)
Cholesterol, Total: 174 mg/dL (ref 100–199)
HDL: 53 mg/dL (ref >39)
LDL Chol Calc (NIH): 84 mg/dL (ref 0–99)
Triglycerides: 223 mg/dL — ABNORMAL HIGH (ref 0–149)
VLDL Cholesterol Cal: 37 mg/dL (ref 5–40)

## 2019-05-29 LAB — HEMOGLOBIN A1C
Est. average glucose Bld gHb Est-mCnc: 146 mg/dL
Hgb A1c MFr Bld: 6.7 % — ABNORMAL HIGH (ref 4.8–5.6)

## 2019-05-30 ENCOUNTER — Ambulatory Visit (INDEPENDENT_AMBULATORY_CARE_PROVIDER_SITE_OTHER): Payer: Medicare Other | Admitting: Medical

## 2019-05-30 ENCOUNTER — Other Ambulatory Visit: Payer: Self-pay

## 2019-05-30 ENCOUNTER — Ambulatory Visit (HOSPITAL_BASED_OUTPATIENT_CLINIC_OR_DEPARTMENT_OTHER)
Admission: RE | Admit: 2019-05-30 | Discharge: 2019-05-30 | Disposition: A | Discharge status: Home / Self Care | Payer: Medicare Other | Source: Ambulatory Visit | Attending: Medical | Admitting: Medical

## 2019-05-30 VITALS — BP 118/80 | HR 101 | Temp 97.2°F | Resp 18 | Wt 177.2 lb

## 2019-05-30 DIAGNOSIS — E118 Type 2 diabetes mellitus with unspecified complications: Secondary | ICD-10-CM

## 2019-05-30 DIAGNOSIS — I10 Essential (primary) hypertension: Secondary | ICD-10-CM | POA: Diagnosis not present

## 2019-05-30 DIAGNOSIS — M545 Low back pain, unspecified: Secondary | ICD-10-CM

## 2019-05-30 DIAGNOSIS — Z794 Long term (current) use of insulin: Secondary | ICD-10-CM

## 2019-05-30 DIAGNOSIS — Z1211 Encounter for screening for malignant neoplasm of colon: Secondary | ICD-10-CM

## 2019-05-30 DIAGNOSIS — R7989 Other specified abnormal findings of blood chemistry: Secondary | ICD-10-CM

## 2019-05-30 DIAGNOSIS — E785 Hyperlipidemia, unspecified: Secondary | ICD-10-CM

## 2019-05-30 DIAGNOSIS — J309 Allergic rhinitis, unspecified: Secondary | ICD-10-CM

## 2019-05-30 MED ORDER — LEVOCETIRIZINE DIHYDROCHLORIDE 5 MG PO TABS: 5.0000 mg | ORAL_TABLET | Freq: Every evening | ORAL | 3 refills | Status: DC

## 2019-05-30 NOTE — Progress Notes (Signed)
   Subjective:    Patient ID: Janet Peters, female    DOB: May 12, 1950, 69 y.o.   MRN: [id number]  HPI  Pt in for follow up.  Pt had a1c recently and a1c was 6.7. No hyperglycemic signs or symptoms.  Pt has some lower back pain for 2 weeks. Some pain in seems to radiate to rt groin area. Pt is in PT for that. She notes pain in back mostly when she sites. No rash noted.  Pt has high cholesterol. Lab recently only showed high triglycerides.  Chronic nasal congestion with pnd. Worse in spring.  Hx of low vit d. She request level.  Mild alk phosphatase increase in past and recently.      Review of Systems  Constitutional: Negative for chills, fatigue and fever.  Respiratory: Negative for cough, chest tightness, shortness of breath and wheezing.   Cardiovascular: Negative for chest pain and palpitations.  Gastrointestinal: Negative for abdominal pain.  Musculoskeletal: Positive for back pain.  Skin: Negative for rash.  Neurological: Negative for dizziness, syncope, weakness and headaches.  Hematological: Negative for adenopathy. Does not bruise/bleed easily.  Psychiatric/Behavioral: Negative for behavioral problems and confusion.       Objective:   Physical Exam  General Appearance- Not in acute distress.    Chest and Lung Exam Auscultation: Breath sounds:-Normal. Clear even and unlabored. Adventitious sounds:- No Adventitious sounds.  Cardiovascular Auscultation:Rythm - Regular, rate and rythm. Heart Sounds -Normal heart sounds.   Back Mid lumbar spine tenderness to palpation. Pain on straight leg lift. Pain on lateral movements and flexion/extension of the spine.  Lower ext neurologic  L5-S1 sensation intact bilaterally. Normal patellar reflexes bilaterally. No foot drop bilaterally.      Assessment & Plan:  For history of diabetes with A1c of 6.7 recently, recommend recommend continue low sugar diet and continue Metformin.  For high  cholesterol, continue current medication.  Mild triglyceride elevation but if you have tighter control your sugar then triglycerides might come down.  Hypertension well-controlled.  Continue current medication.  You probably do have recent allergic rhinitis with postnasal drainage.  Continue Flonase and I will prescribe Xyzal.  If postnasal drainage continues and will refer to ENT as you requested.  Just call back and notify me.  For history of low vitamin D, I placed future vitamin D order.  Minimal alkaline phosphatase elevation recently.  Will repeat CMP in 1 month.  If alkaline phosphatase increases then will expand work-up.  Recent low back pain.  Will get lumbar spine x-ray.  Advise increase ibuprofen to 400 mg every 8 hours as needed.  Continue PT.  Follow-up date to be determined after lab review.  Total of 45 minutes spent with patient   Mackie Pai, PA-C

## 2019-05-30 NOTE — Patient Instructions (Signed)
For history of diabetes with A1c of 6.7 recently, recommend recommend continue low sugar diet and continue Metformin.  For high cholesterol, continue current medication.  Mild triglyceride elevation but if you have tighter control your sugar then triglycerides might come down.  Hypertension well-controlled.  Continue current medication.  You probably do have recent allergic rhinitis with postnasal drainage.  Continue Flonase and I will prescribe Xyzal.  If postnasal drainage continues and will refer to ENT as you requested.  Just call back and notify me.  For history of low vitamin D, I placed future vitamin D order.  Minimal alkaline phosphatase elevation recently.  Will repeat CMP in 1 month.  If alkaline phosphatase increases then will expand work-up.  Recent low back pain.  Will get lumbar spine x-ray.  Advise increase ibuprofen to 400 mg every 8 hours as needed.  Continue PT.  Follow-up date to be determined after lab review.

## 2019-06-02 ENCOUNTER — Other Ambulatory Visit: Payer: Self-pay | Admitting: Medical

## 2019-06-02 MED ORDER — FLUTICASONE PROPIONATE 50 MCG/ACT NA SUSP: 2.0000 | Freq: Every day | NASAL | 1 refills | Status: DC

## 2019-06-02 NOTE — Addendum Note (Signed)
Addended by: Jiles Prows on: 06/02/2019 04:43 PM   Modules accepted: Orders

## 2019-06-04 ENCOUNTER — Ambulatory Visit: Payer: Medicare Other | Admitting: Medical

## 2019-06-06 ENCOUNTER — Ambulatory Visit: Payer: Medicare Other | Attending: Internal Medicine

## 2019-06-06 DIAGNOSIS — Z23 Encounter for immunization: Secondary | ICD-10-CM

## 2019-06-06 NOTE — Progress Notes (Signed)
   Covid-19 Vaccination Clinic  Name:  Griselle Killoren    MRN: [id number] DOB: [birth year]  06/06/2019  Ms. Arlenys Kesler was observed post Covid-19 immunization for 15 minutes without incident. She was provided with Vaccine Information Sheet and instruction to access the V-Safe system.   Ms. Kylah Puricelli was instructed to call 911 with any severe reactions post vaccine: Marland Kitchen Difficulty breathing  . Swelling of face and throat  . A fast heartbeat  . A bad rash all over body  . Dizziness and weakness   Immunizations Administered    Name Date Dose VIS Date Route   Pfizer COVID-19 Vaccine 06/06/2019 12:44 PM 0.3 mL 03/07/2019 Intramuscular   Manufacturer: [place]   Lot: [id number]   [place]: [id number]

## 2019-06-24 ENCOUNTER — Other Ambulatory Visit: Payer: Self-pay | Admitting: Medical

## 2019-07-01 ENCOUNTER — Other Ambulatory Visit (INDEPENDENT_AMBULATORY_CARE_PROVIDER_SITE_OTHER): Payer: Medicare Other

## 2019-07-01 ENCOUNTER — Other Ambulatory Visit: Payer: Self-pay

## 2019-07-01 DIAGNOSIS — I10 Essential (primary) hypertension: Secondary | ICD-10-CM | POA: Diagnosis not present

## 2019-07-01 DIAGNOSIS — R7989 Other specified abnormal findings of blood chemistry: Secondary | ICD-10-CM

## 2019-07-01 LAB — COMPREHENSIVE METABOLIC PANEL
ALT: 24 U/L (ref 0–35)
AST: 19 U/L (ref 0–37)
Albumin: 4.3 g/dL (ref 3.5–5.2)
Alkaline Phosphatase: 108 U/L (ref 39–117)
BUN: 13 mg/dL (ref 6–23)
CO2: 26 mEq/L (ref 19–32)
Calcium: 9.5 mg/dL (ref 8.4–10.5)
Chloride: 104 mEq/L (ref 96–112)
Creatinine, Ser: 0.66 mg/dL (ref 0.40–1.20)
GFR: 88.83 mL/min (ref >60.00)
Glucose, Bld: 136 mg/dL — ABNORMAL HIGH (ref 70–99)
Potassium: 4.4 mEq/L (ref 3.5–5.1)
Sodium: 139 mEq/L (ref 135–145)
Total Bilirubin: 0.4 mg/dL (ref 0.2–1.2)
Total Protein: 7.1 g/dL (ref 6.0–8.3)

## 2019-07-02 ENCOUNTER — Ambulatory Visit: Payer: Medicare Other | Attending: Internal Medicine

## 2019-07-02 DIAGNOSIS — Z23 Encounter for immunization: Secondary | ICD-10-CM

## 2019-07-02 NOTE — Progress Notes (Signed)
   Covid-19 Vaccination Clinic  Name:  Janet Peters    MRN: [id number] DOB: Dec 05, 1950  07/02/2019  [name] was observed post Covid-19 immunization for 15 minutes without incident. She was provided with Vaccine Information Sheet and instruction to access the V-Safe system.   [name] was instructed to call 911 with any severe reactions post vaccine: Marland Kitchen Difficulty breathing  . Swelling of face and throat  . A fast heartbeat  . A bad rash all over body  . Dizziness and weakness   Immunizations Administered    Name Date Dose VIS Date Route   Pfizer COVID-19 Vaccine 07/02/2019  8:53 AM 0.3 mL 03/07/2019 Intramuscular   Manufacturer: [place]   Lot: [id number]   [place]: [id number]

## 2019-07-04 LAB — VITAMIN D 1,25 DIHYDROXY
Vitamin D 1, 25 (OH)2 Total: 39 pg/mL (ref 18–72)
Vitamin D2 1, 25 (OH)2: <8 pg/mL
Vitamin D3 1, 25 (OH)2: 39 pg/mL

## 2019-07-22 IMAGING — US US PELVIS COMPLETE
1 series · 14 of 25 positions shown · non-contrast
Comparison: None

CLINICAL DATA: Pelvic pain

EXAM:
TRANSABDOMINAL AND TRANSVAGINAL ULTRASOUND OF PELVIS
TECHNIQUE: Study was performed transabdominally to optimize pelvic field of
view evaluation and transvaginally to optimize internal visceral
architecture evaluation.

[Series 1: us pelvis complete · 0.24mm/px · 14 of 82 slices shown]
[im 1/82]
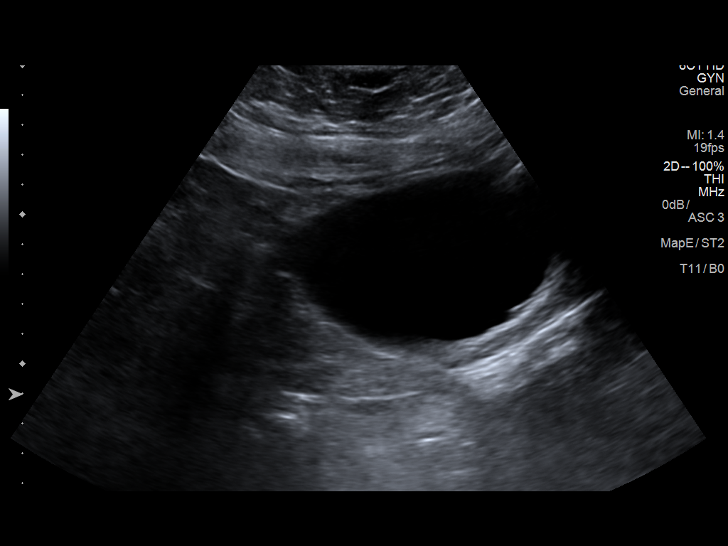
[im 7/82]
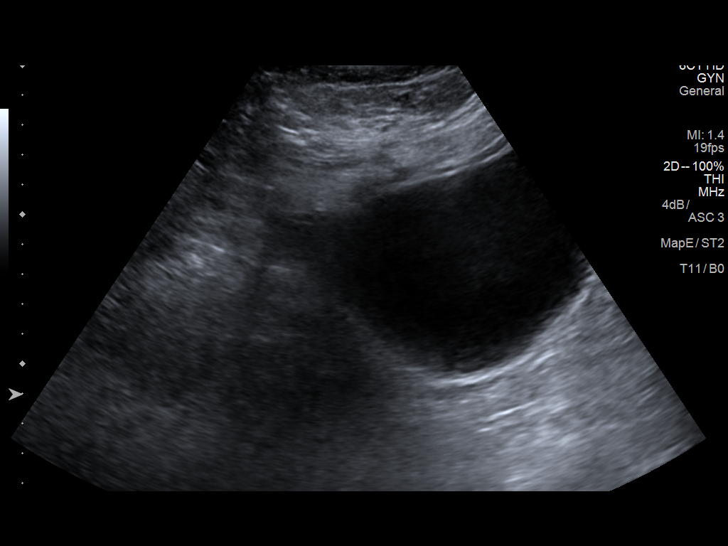
[im 14/82]
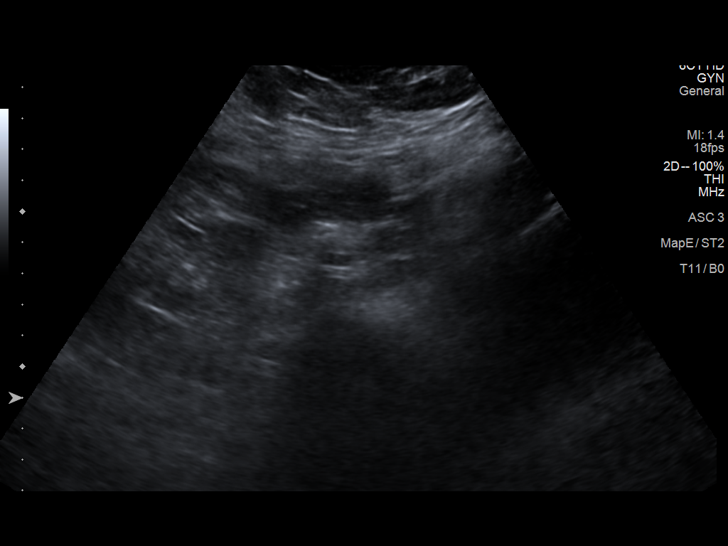
[im 21/82]
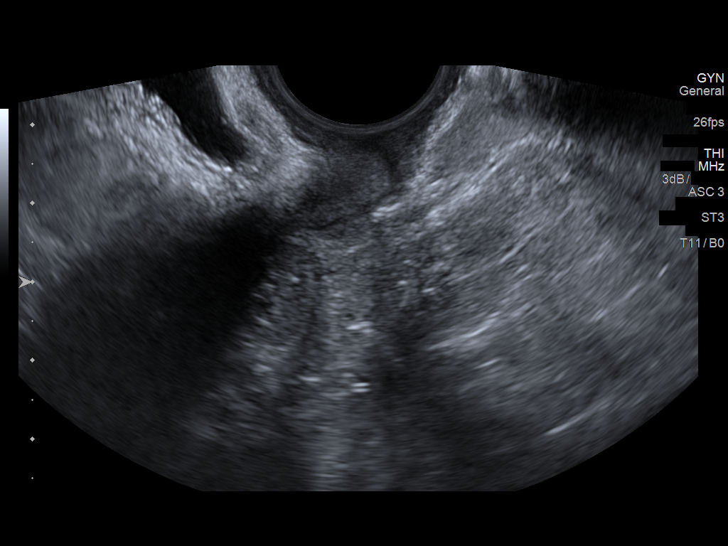
[im 28/82]
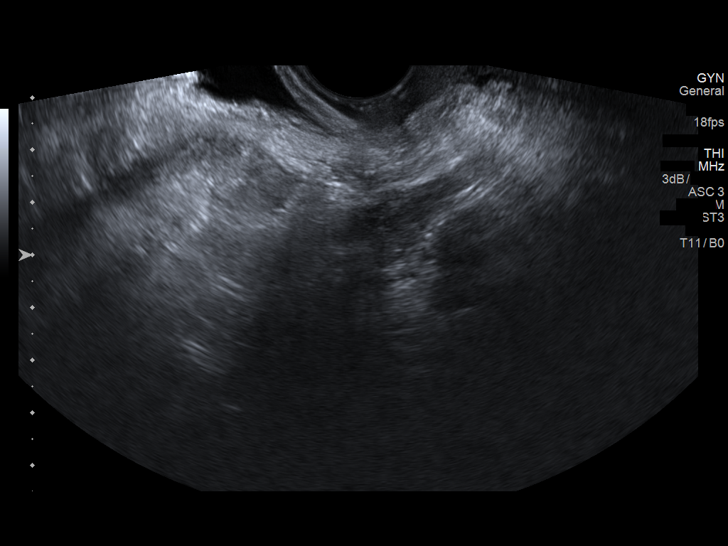
[im 31/82]
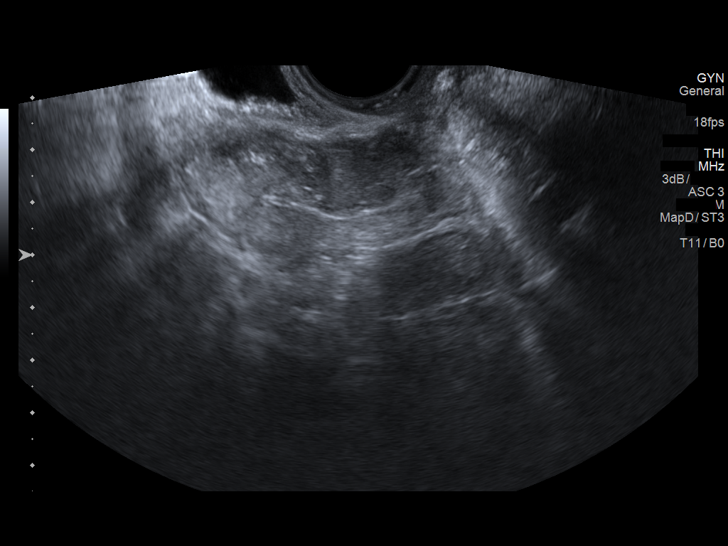
[im 38/82]
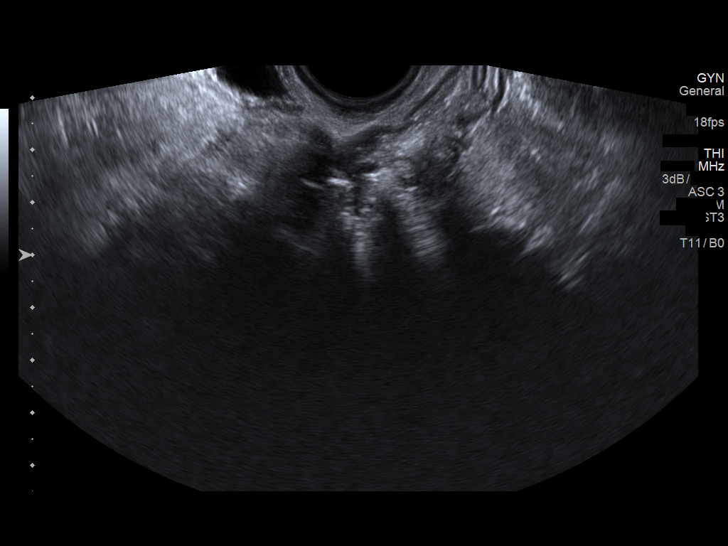
[im 44/82]
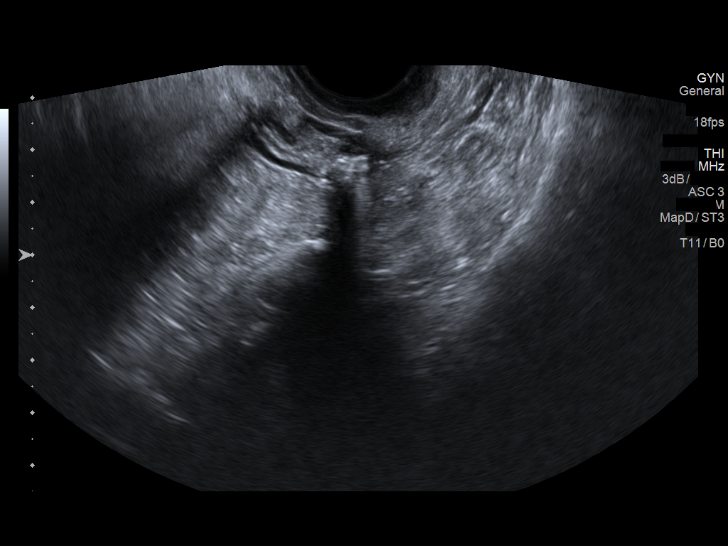
[im 51/82]
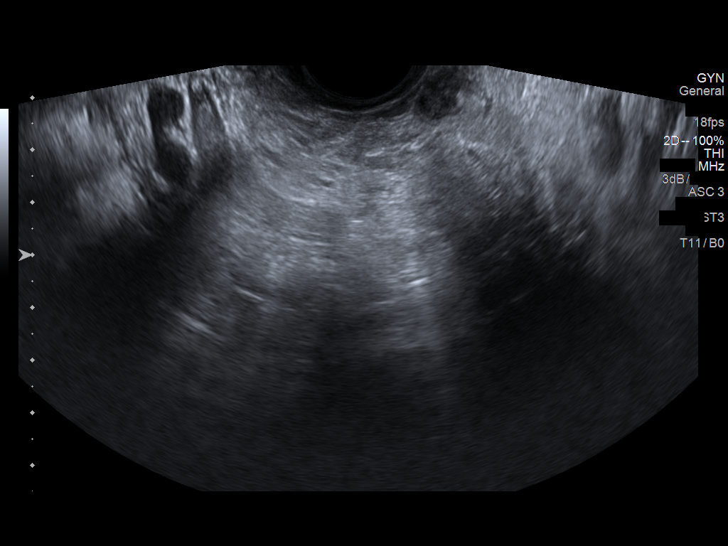
[im 55/82]
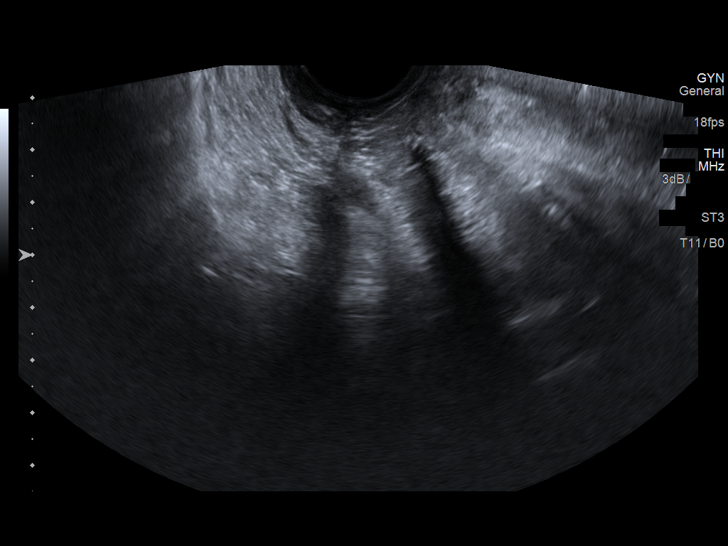
[im 61/82]
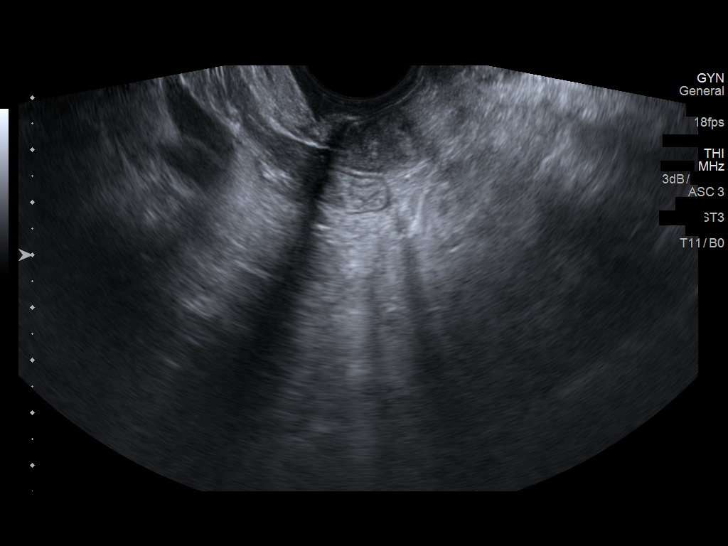
[im 68/82]
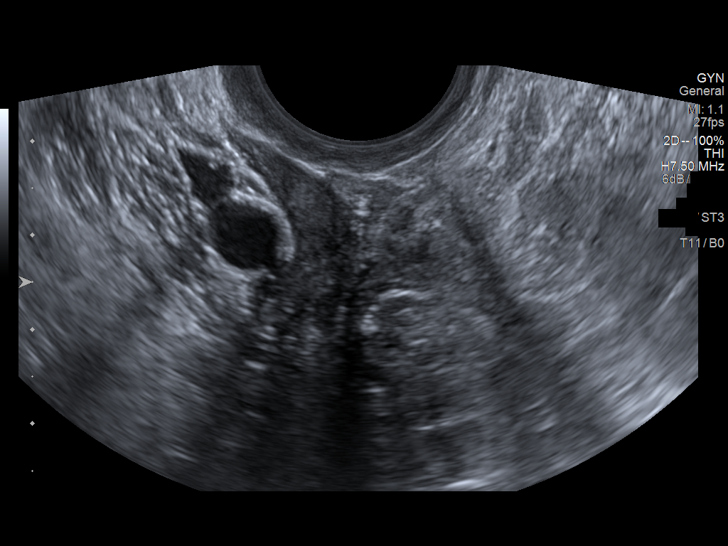
[im 75/82]
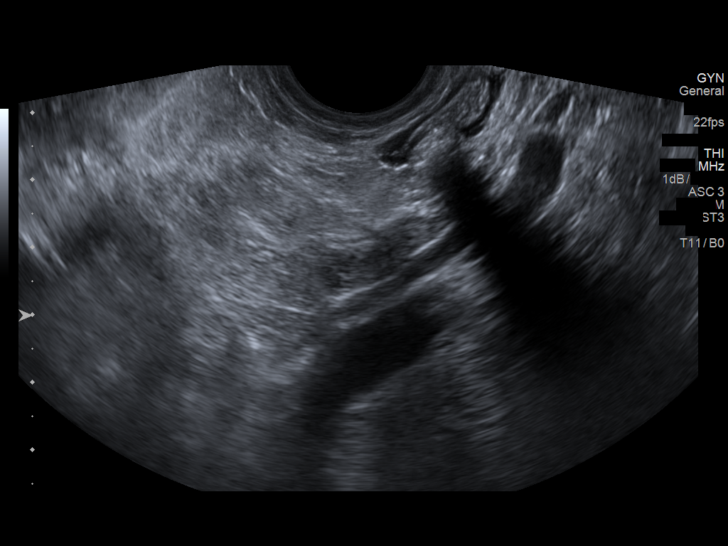
[im 82/82]
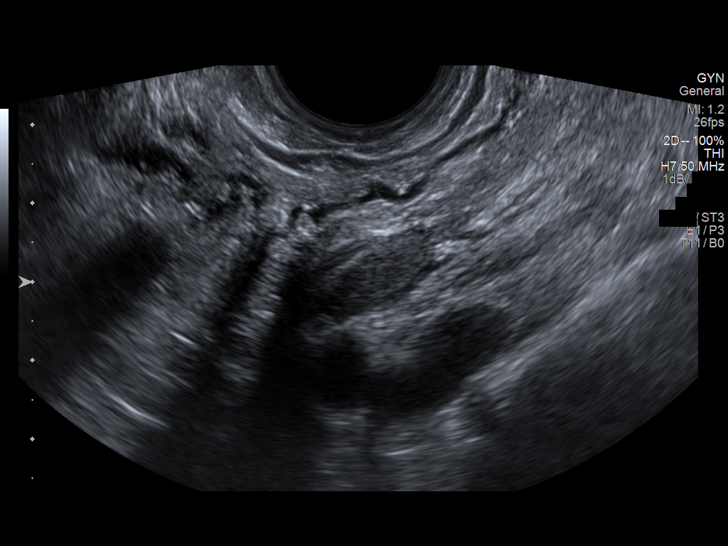

[14 of 25 positions shown; findings below may reference images not displayed]

FINDINGS: Uterus

Surgically absent.

Right ovary

Measurements: 1.7 x 1.3 x 0.7 cm. Normal appearance/no adnexal mass.

Left ovary

Measurements: 1.8 x 0.8 x 1.8 cm. Normal appearance/no adnexal mass.

Other findings

No abnormal free fluid.
IMPRESSION: Uterus absent. No demonstrable pelvic mass or fluid collection. No
lesion evident.

## 2019-08-01 ENCOUNTER — Other Ambulatory Visit: Payer: Self-pay | Admitting: Cardiology

## 2019-10-02 ENCOUNTER — Encounter: Payer: Self-pay | Admitting: Medical

## 2019-10-13 ENCOUNTER — Encounter: Payer: Medicare Other | Admitting: Obstetrics & Gynecology

## 2019-11-05 ENCOUNTER — Other Ambulatory Visit: Payer: Self-pay

## 2019-11-05 ENCOUNTER — Ambulatory Visit (INDEPENDENT_AMBULATORY_CARE_PROVIDER_SITE_OTHER): Payer: Medicare Other | Admitting: Obstetrics & Gynecology

## 2019-11-05 ENCOUNTER — Encounter: Payer: Self-pay | Admitting: Obstetrics & Gynecology

## 2019-11-05 VITALS — BP 140/88 | Ht 60.0 in | Wt 177.0 lb

## 2019-11-05 DIAGNOSIS — Z6834 Body mass index (BMI) 34.0-34.9, adult: Secondary | ICD-10-CM

## 2019-11-05 DIAGNOSIS — Z90710 Acquired absence of both cervix and uterus: Secondary | ICD-10-CM | POA: Diagnosis not present

## 2019-11-05 DIAGNOSIS — M8588 Other specified disorders of bone density and structure, other site: Secondary | ICD-10-CM

## 2019-11-05 DIAGNOSIS — E6609 Other obesity due to excess calories: Secondary | ICD-10-CM

## 2019-11-05 DIAGNOSIS — Z01419 Encounter for gynecological examination (general) (routine) without abnormal findings: Secondary | ICD-10-CM | POA: Diagnosis not present

## 2019-11-05 DIAGNOSIS — Z780 Asymptomatic menopausal state: Secondary | ICD-10-CM | POA: Diagnosis not present

## 2019-11-05 NOTE — Progress Notes (Signed)
Janet Peters 1951/01/16 [id number]   History:    69 y.o. G2P2L2 Married.  From [place], went recently as her mother passed away from [place].  Daughter in [place] 86 yo.  Son in [place].  RP:  Established patient presenting for annual gyn exam   HPI: S/P Total Hysterectomy.  Menopause, well on no HRT.  No pelvic pain.  No pain with IC.  Urine/BMs normal.  Breasts normal.  BMI increased to 34.57.  Walking regularly.  Health labs with Fam MD.  Past medical history,surgical history, family history and social history were all reviewed and documented in the EPIC chart.  Gynecologic History No LMP recorded. Patient has had a hysterectomy.  Obstetric History OB History  Gravida Para Term Preterm AB Living  2 2       2   SAB TAB Ectopic Multiple Live Births               # Outcome Date GA Lbr Len/2nd Weight Sex Delivery Anes PTL Lv  2 Para           1 Para              ROS: A ROS was performed and pertinent positives and negatives are included in the history.  GENERAL: No fevers or chills. HEENT: No change in vision, no earache, sore throat or sinus congestion. NECK: No pain or stiffness. CARDIOVASCULAR: No chest pain or pressure. No palpitations. PULMONARY: No shortness of breath, cough or wheeze. GASTROINTESTINAL: No abdominal pain, nausea, vomiting or diarrhea, melena or bright red blood per rectum. GENITOURINARY: No urinary frequency, urgency, hesitancy or dysuria. MUSCULOSKELETAL: No joint or muscle pain, no back pain, no recent trauma. DERMATOLOGIC: No rash, no itching, no lesions. ENDOCRINE: No polyuria, polydipsia, no heat or cold intolerance. No recent change in weight. HEMATOLOGICAL: No anemia or easy bruising or bleeding. NEUROLOGIC: No headache, seizures, numbness, tingling or weakness. PSYCHIATRIC: No depression, no loss of interest in normal activity or change in sleep pattern.     Exam:   BP 140/88   Ht 5' (1.524 m)   Wt 177 lb (80.3 kg)   BMI 34.57 kg/m    Body mass index is 34.57 kg/m.  General appearance : Well developed well nourished female. No acute distress HEENT: Eyes: no retinal hemorrhage or exudates,  Neck supple, trachea midline, no carotid bruits, no thyroidmegaly Lungs: Clear to auscultation, no rhonchi or wheezes, or rib retractions  Heart: Regular rate and rhythm, no murmurs or gallops Breast:Examined in sitting and supine position were symmetrical in appearance, no palpable masses or tenderness,  no skin retraction, no nipple inversion, no nipple discharge, no skin discoloration, no axillary or supraclavicular lymphadenopathy Abdomen: no palpable masses or tenderness, no rebound or guarding Extremities: no edema or skin discoloration or tenderness  Pelvic: Vulva: Normal             Vagina: No gross lesions or discharge  Cervix/Uterus absent  Adnexa  Without masses or tenderness  Anus: Normal   Assessment/Plan:  69 y.o. female for annual exam   1. Well female exam with routine gynecological exam Gynecologic exam status post total hysterectomy and menopause. No indication to repeat a Pap test this year. Breast exam normal. Screening mammogram Feb 2021 was negative. Colonoscopy 2011. Health labs with family physician.  2. Hx of total hysterectomy  3. Postmenopause Well on no hormone replacement therapy.  4. Osteopenia of lumbar spine Osteopenia on bone density Sep 2020. Will repeat at 2  years Sep 2022. Continue with vitamin D supplements, calcium intake of 1200 to 1500 mg daily and regular weightbearing physical activities.  5. Class 1 obesity due to excess calories without serious comorbidity with body mass index (BMI) of 34.0 to 34.9 in adult Recommend a lower calorie/carb diet such as [employer]. Aerobic activities 5 times a week and light weightlifting every 2 days.  Princess Bruins MD, 8:26 AM 11/05/2019

## 2019-11-12 ENCOUNTER — Ambulatory Visit: Payer: Medicare Other | Admitting: Medical

## 2019-11-17 ENCOUNTER — Other Ambulatory Visit: Payer: Self-pay

## 2019-11-17 ENCOUNTER — Telehealth: Payer: Self-pay | Admitting: Medical

## 2019-11-17 ENCOUNTER — Ambulatory Visit (INDEPENDENT_AMBULATORY_CARE_PROVIDER_SITE_OTHER): Payer: Medicare Other | Admitting: Medical

## 2019-11-17 ENCOUNTER — Encounter: Payer: Self-pay | Admitting: Medical

## 2019-11-17 VITALS — BP 120/62 | HR 103 | Temp 98.4°F | Resp 16 | Ht 60.0 in | Wt 177.6 lb

## 2019-11-17 DIAGNOSIS — E785 Hyperlipidemia, unspecified: Secondary | ICD-10-CM | POA: Diagnosis not present

## 2019-11-17 DIAGNOSIS — Z794 Long term (current) use of insulin: Secondary | ICD-10-CM

## 2019-11-17 DIAGNOSIS — R5383 Other fatigue: Secondary | ICD-10-CM | POA: Diagnosis not present

## 2019-11-17 DIAGNOSIS — R1011 Right upper quadrant pain: Secondary | ICD-10-CM | POA: Diagnosis not present

## 2019-11-17 DIAGNOSIS — E118 Type 2 diabetes mellitus with unspecified complications: Secondary | ICD-10-CM

## 2019-11-17 LAB — COMPREHENSIVE METABOLIC PANEL
ALT: 19 U/L (ref 0–35)
AST: 17 U/L (ref 0–37)
Albumin: 4.5 g/dL (ref 3.5–5.2)
Alkaline Phosphatase: 107 U/L (ref 39–117)
BUN: 15 mg/dL (ref 6–23)
CO2: 25 mEq/L (ref 19–32)
Calcium: 9.6 mg/dL (ref 8.4–10.5)
Chloride: 104 mEq/L (ref 96–112)
Creatinine, Ser: 0.62 mg/dL (ref 0.40–1.20)
GFR: 95.36 mL/min (ref >60.00)
Glucose, Bld: 120 mg/dL — ABNORMAL HIGH (ref 70–99)
Potassium: 4.1 mEq/L (ref 3.5–5.1)
Sodium: 140 mEq/L (ref 135–145)
Total Bilirubin: 0.4 mg/dL (ref 0.2–1.2)
Total Protein: 7.7 g/dL (ref 6.0–8.3)

## 2019-11-17 LAB — CBC WITH DIFFERENTIAL/PLATELET
Basophils Absolute: 0.1 10*3/uL (ref 0.0–0.1)
Basophils Relative: 0.6 % (ref 0.0–3.0)
Eosinophils Absolute: 0.2 10*3/uL (ref 0.0–0.7)
Eosinophils Relative: 2.5 % (ref 0.0–5.0)
HCT: 40.1 % (ref 36.0–46.0)
Hemoglobin: 13.4 g/dL (ref 12.0–15.0)
Lymphocytes Relative: 36.2 % (ref 12.0–46.0)
Lymphs Abs: 3.3 10*3/uL (ref 0.7–4.0)
MCHC: 33.4 g/dL (ref 30.0–36.0)
MCV: 87.9 fl (ref 78.0–100.0)
Monocytes Absolute: 0.6 10*3/uL (ref 0.1–1.0)
Monocytes Relative: 6.1 % (ref 3.0–12.0)
Neutro Abs: 5 10*3/uL (ref 1.4–7.7)
Neutrophils Relative %: 54.6 % (ref 43.0–77.0)
Platelets: 344 10*3/uL (ref 150.0–400.0)
RBC: 4.56 Mil/uL (ref 3.87–5.11)
RDW: 13.8 % (ref 11.5–15.5)
WBC: 9.2 10*3/uL (ref 4.0–10.5)

## 2019-11-17 LAB — IRON: Iron: 81 ug/dL (ref 42–145)

## 2019-11-17 LAB — TSH: TSH: 3.92 u[IU]/mL (ref 0.35–4.50)

## 2019-11-17 LAB — LIPASE: Lipase: 19 U/L (ref 11.0–59.0)

## 2019-11-17 LAB — T4, FREE: Free T4: 0.75 ng/dL (ref 0.60–1.60)

## 2019-11-17 LAB — VITAMIN B12: Vitamin B-12: 295 pg/mL (ref 211–911)

## 2019-11-17 LAB — VITAMIN D 25 HYDROXY (VIT D DEFICIENCY, FRACTURES): VITD: 30.25 ng/mL (ref 30.00–100.00)

## 2019-11-17 MED ORDER — VENLAFAXINE HCL ER 37.5 MG PO CP24: 37.5000 mg | ORAL_CAPSULE | Freq: Every day | ORAL | 0 refills | Status: DC

## 2019-11-17 MED ORDER — VITAMIN D (ERGOCALCIFEROL) 1.25 MG (50000 UNIT) PO CAPS: 50000.0000 [IU] | ORAL_CAPSULE | ORAL | 0 refills | Status: DC

## 2019-11-17 NOTE — Patient Instructions (Addendum)
For diabetes will get a1c today.  For high cholesterol will get cmp amd lipid panel today.  For hx of elevated bp in office continue to check bp at home. If going over 130/80 consistently then will rx low dose ace inhibitor.  For rt upper quadrant discomfort get US abdomen.  For depression will rx effexor low dose.   For fatigue get b12, b1, vit d and iron level.  Follow up in one month or as needed

## 2019-11-17 NOTE — Addendum Note (Signed)
Addended by: Kelle Darting A on: 11/17/2019 12:44 PM   Modules accepted: Orders

## 2019-11-17 NOTE — Progress Notes (Signed)
Subjective:    Patient ID: Janet Peters, female    DOB: December 23, 1950, 69 y.o.   MRN: [id number]  HPI Pt in for follow up.  Pt has diabetes. Pt is on metformin. Last a1c was 6.7   Pt has high cholesterol/triglycerides.  Pt is taking vit D over the counter. Last level was normal.   Pt bp is mild elevated today. Pt states her bp at home 120-130.  Pt states she has some mild rt upper quadrant discomfort.   Pt has feeling of discomfort in upper throat. Maybe like pain swallowing.  Pt depressed and sad. Her mom passed away in 28-Aug-2022 from covid.     Review of Systems  Constitutional: Positive for fatigue.  Respiratory: Negative for chest tightness, shortness of breath and wheezing.   Cardiovascular: Negative for chest pain and palpitations.  Gastrointestinal: Positive for abdominal pain. Negative for nausea and rectal pain.  Musculoskeletal: Negative for back pain.  Skin: Negative for rash.  Psychiatric/Behavioral: Negative for behavioral problems and suicidal ideas. The patient is not nervous/anxious.    Past Medical History:  Diagnosis Date  . Diabetes mellitus without complication ([place])   . Hyperlipidemia   . Osteopenia 10/2016   T score -1.3 FRAX 3.5%/0.1%     Social History   Socioeconomic History  . Marital status: Married    Spouse name: Not on file  . Number of children: Not on file  . Years of education: Not on file  . Highest education level: Not on file  Occupational History  . Not on file  Tobacco Use  . Smoking status: Never Smoker  . Smokeless tobacco: Never Used  Vaping Use  . Vaping Use: Never used  Substance and Sexual Activity  . Alcohol use: No  . Drug use: No  . Sexual activity: Yes    Partners: Male    Comment: 1st intercourse- 66, partners - 2, married- 39 yrs  Other Topics Concern  . Not on file  Social History Narrative  . Not on file   Social Determinants of Health   Financial Resource Strain:   . Difficulty of Paying Living  Expenses: Not on file  Food Insecurity:   . Worried About Charity fundraiser in the Last Year: Not on file  . Ran Out of Food in the Last Year: Not on file  Transportation Needs:   . Lack of Transportation (Medical): Not on file  . Lack of Transportation (Non-Medical): Not on file  Physical Activity:   . Days of Exercise per Week: Not on file  . Minutes of Exercise per Session: Not on file  Stress:   . Feeling of Stress : Not on file  Social Connections:   . Frequency of Communication with Friends and Family: Not on file  . Frequency of Social Gatherings with Friends and Family: Not on file  . Attends Religious Services: Not on file  . Active Member of Clubs or Organizations: Not on file  . Attends Archivist Meetings: Not on file  . Marital Status: Not on file  Intimate Partner Violence:   . Fear of Current or Ex-Partner: Not on file  . Emotionally Abused: Not on file  . Physically Abused: Not on file  . Sexually Abused: Not on file    Past Surgical History:  Procedure Laterality Date  . ABDOMINAL HYSTERECTOMY    . CATARACT EXTRACTION Left   . CESAREAN SECTION     x2  . CHOLECYSTECTOMY, LAPAROSCOPIC    .  KNEE SURGERY Left 2011    Family History  Problem Relation Age of Onset  . Heart attack Maternal Grandmother     No Known Allergies  Current Outpatient Medications on File Prior to Visit  Medication Sig Dispense Refill  . Accu-Chek Softclix Lancets lancets TEST TWICE DAILY 200 each 0  . atorvastatin (LIPITOR) 10 MG tablet TAKE 1 TABLET BY MOUTH DAILY 90 tablet 1  . Blood Glucose Calibration (ACCU-CHEK COMPACT PLUS CONTROL) SOLN 1 each by In Vitro route daily as needed. 1 each 1  . Cholecalciferol (VITAMIN D3) 5000 units CAPS Take by mouth.    . fluticasone (FLONASE) 50 MCG/ACT nasal spray SHAKE LIQUID AND USE 2 SPRAYS IN EACH NOSTRIL DAILY 48 g 1  . glucose blood (ACCU-CHEK AVIVA PLUS) test strip USE TWICE DAILY AS DIRECTED.  DX CODE: E11.9 200 each 1  .  levocetirizine (XYZAL) 5 MG tablet Take 1 tablet (5 mg total) by mouth every evening. 30 tablet 3  . metFORMIN (GLUCOPHAGE) 500 MG tablet Take 1 tablet (500 mg total) by mouth 2 (two) times daily with a meal. 180 tablet 3   No current facility-administered medications on file prior to visit.    BP (!) 143/75   Pulse (!) 103   Temp 98.4 F (36.9 C) (Oral)   Resp 16   Ht 5' (1.524 m)   Wt 177 lb 9.6 oz (80.6 kg)   SpO2 100%   BMI 34.69 kg/m       Objective:   Physical Exam  General- No acute distress. Pleasant patient. Neck- Full range of motion, no jvd Lungs- Clear, even and unlabored. Heart- regular rate and rhythm. Neurologic- CNII- XII grossly intact. Abdomen- soft, nt, faint rt upper quadrant tender, +bs, no rebound or guarding. Faint rt upper quadrant tenderness.      Assessment & Plan:  For diabetes will get a1c today.  For high cholesterol will get cmp amd lipid panel today.  For hx of elevated bp in office continue to check bp at home. If going over 130/80 consistently then will rx low dose ace inhibitor.  For rt upper quadrant discomfort get US abdomen.  For depression will rx effexor low dose.   For fatigue get b12, b1, vit d and iron level.  Follow up in one month or as needed  Mackie Pai, PA-C   Time spent with patient today was  30 minutes which consisted of chart review, discussing diagnosis, work up treatment and documentation.

## 2019-11-17 NOTE — Telephone Encounter (Signed)
Rx vit D sent to pt pharmacy. 

## 2019-11-18 ENCOUNTER — Other Ambulatory Visit: Payer: Self-pay | Admitting: Medical

## 2019-11-18 LAB — MICROALBUMIN, URINE: Microalb, Ur: 0.2 mg/dL

## 2019-11-19 ENCOUNTER — Encounter: Payer: Self-pay | Admitting: Cardiology

## 2019-11-19 ENCOUNTER — Other Ambulatory Visit: Payer: Medicare Other

## 2019-11-19 ENCOUNTER — Other Ambulatory Visit (HOSPITAL_BASED_OUTPATIENT_CLINIC_OR_DEPARTMENT_OTHER): Payer: Medicare Other

## 2019-11-19 ENCOUNTER — Ambulatory Visit: Payer: Medicare Other | Admitting: Cardiology

## 2019-11-19 ENCOUNTER — Other Ambulatory Visit: Payer: Self-pay

## 2019-11-19 VITALS — BP 134/80 | HR 100 | Ht 63.0 in | Wt 177.0 lb

## 2019-11-19 DIAGNOSIS — R0789 Other chest pain: Secondary | ICD-10-CM | POA: Insufficient documentation

## 2019-11-19 DIAGNOSIS — R1011 Right upper quadrant pain: Secondary | ICD-10-CM

## 2019-11-19 DIAGNOSIS — E088 Diabetes mellitus due to underlying condition with unspecified complications: Secondary | ICD-10-CM | POA: Diagnosis not present

## 2019-11-19 DIAGNOSIS — E782 Mixed hyperlipidemia: Secondary | ICD-10-CM | POA: Diagnosis not present

## 2019-11-19 HISTORY — DX: Other chest pain: R07.89

## 2019-11-19 NOTE — Progress Notes (Signed)
Cardiology Office Note:    Date:  11/19/2019   ID:  Janet Peters, DOB 1950/05/05, MRN [id number]  PCP:  Mackie Pai, PA-C  Cardiologist:  Jenean Lindau, MD   Referring MD: Mackie Pai, PA-C    ASSESSMENT:    1. Diabetes mellitus due to underlying condition with unspecified complications ([place])   2. Mixed dyslipidemia   3. Chest discomfort    PLAN:    In order of problems listed above:  1. Primary prevention stressed with the patient.  Importance of compliance with diet medication stressed and she vocalized understanding. 2. Essential hypertension: Blood pressure stable and diet was emphasized 3. Mixed dyslipidemia and diabetes mellitus: Patient is on statin therapy.  Lipids and sugars followed by primary care physician.  I reviewed the KPN sheet and lipids are fine but hemoglobin A1c was elevated and diet was emphasized.  She vocalized understanding.  She was advised to take a coated baby aspirin on a daily basis 4. Patient will be seen in follow-up appointment in 6 months or earlier if the patient has any concerns    Medication Adjustments/Labs and Tests Ordered: Current medicines are reviewed at length with the patient today.  Concerns regarding medicines are outlined above.  No orders of the defined types were placed in this encounter.  No orders of the defined types were placed in this encounter.    No chief complaint on file.    History of Present Illness:    Janet Peters is a 69 y.o. female.  Patient has past medical history of diabetes mellitus and mixed dyslipidemia.  She denies any problems at this time and takes care of activities of daily living.  She occasionally has stabbing-like chest discomfort not related to exertion.  She mentions to me that she can walk 45 minutes a day which she does on a regular basis.  No chest pain orthopnea or PND with this.  At the time of my evaluation, the patient is alert awake oriented and in no  distress.  Past Medical History:  Diagnosis Date  . Diabetes mellitus without complication ([place])   . Hyperlipidemia   . Osteopenia 10/2016   T score -1.3 FRAX 3.5%/0.1%    Past Surgical History:  Procedure Laterality Date  . ABDOMINAL HYSTERECTOMY    . CATARACT EXTRACTION Left   . CESAREAN SECTION     x2  . CHOLECYSTECTOMY, LAPAROSCOPIC    . KNEE SURGERY Left 2011    Current Medications: Current Meds  Medication Sig  . Accu-Chek Softclix Lancets lancets TEST TWICE DAILY  . atorvastatin (LIPITOR) 10 MG tablet TAKE 1 TABLET BY MOUTH DAILY  . Blood Glucose Calibration (ACCU-CHEK COMPACT PLUS CONTROL) SOLN 1 each by In Vitro route daily as needed.  . Cholecalciferol (VITAMIN D3) 5000 units CAPS Take by mouth.  . fluticasone (FLONASE) 50 MCG/ACT nasal spray SHAKE LIQUID AND USE 2 SPRAYS IN EACH NOSTRIL DAILY  . glucose blood (ACCU-CHEK AVIVA PLUS) test strip USE TWICE DAILY AS DIRECTED.  DX CODE: E11.9  . levocetirizine (XYZAL) 5 MG tablet Take 1 tablet (5 mg total) by mouth every evening.  . metFORMIN (GLUCOPHAGE) 500 MG tablet Take 1 tablet (500 mg total) by mouth 2 (two) times daily with a meal.  . Vitamin D, Ergocalciferol, (DRISDOL) 1.25 MG (50000 UNIT) CAPS capsule Take 1 capsule (50,000 Units total) by mouth every 7 (seven) days.     Allergies:   Patient has no known allergies.   Social History  Socioeconomic History  . Marital status: Married    Spouse name: Not on file  . Number of children: Not on file  . Years of education: Not on file  . Highest education level: Not on file  Occupational History  . Not on file  Tobacco Use  . Smoking status: Never Smoker  . Smokeless tobacco: Never Used  Vaping Use  . Vaping Use: Never used  Substance and Sexual Activity  . Alcohol use: No  . Drug use: No  . Sexual activity: Yes    Partners: Male    Comment: 1st intercourse- 9, partners - 2, married- 59 yrs  Other Topics Concern  . Not on file  Social History  Narrative  . Not on file   Social Determinants of Health   Financial Resource Strain:   . Difficulty of Paying Living Expenses: Not on file  Food Insecurity:   . Worried About Charity fundraiser in the Last Year: Not on file  . Ran Out of Food in the Last Year: Not on file  Transportation Needs:   . Lack of Transportation (Medical): Not on file  . Lack of Transportation (Non-Medical): Not on file  Physical Activity:   . Days of Exercise per Week: Not on file  . Minutes of Exercise per Session: Not on file  Stress:   . Feeling of Stress : Not on file  Social Connections:   . Frequency of Communication with Friends and Family: Not on file  . Frequency of Social Gatherings with Friends and Family: Not on file  . Attends Religious Services: Not on file  . Active Member of Clubs or Organizations: Not on file  . Attends Archivist Meetings: Not on file  . Marital Status: Not on file     Family History: The patient's family history includes Heart attack in her maternal grandmother.  ROS:   Please see the history of present illness.    All other systems reviewed and are negative.  EKGs/Labs/Other Studies Reviewed:    The following studies were reviewed today: I discussed my findings with the patient extensively.   Recent Labs: 11/17/2019: ALT 19; BUN 15; Creatinine, Ser 0.62; Hemoglobin 13.4; Platelets 344.0; Potassium 4.1; Sodium 140; TSH 3.92  Recent Lipid Panel    Component Value Date/Time   CHOL 174 05/28/2019 0926   TRIG 223 (H) 05/28/2019 0926   HDL 53 05/28/2019 0926   CHOLHDL 3.3 05/28/2019 0926   CHOLHDL 4 02/03/2019 0909   VLDL 47.6 (H) 02/03/2019 0909   LDLCALC 84 05/28/2019 0926   LDLDIRECT 94.0 02/03/2019 0909    Physical Exam:    VS:  BP 134/80   Pulse 100   Ht 5\' 3"  (1.6 m)   Wt 409 lb 0.6 oz (80.3 kg)   SpO2 98%   BMI 31.36 kg/m     Wt Readings from Last 3 Encounters:  11/19/19 177 lb 0.6 oz (80.3 kg)  11/17/19 177 lb 9.6 oz (80.6  kg)  11/05/19 177 lb (80.3 kg)     GEN: Patient is in no acute distress HEENT: Normal NECK: No JVD; No carotid bruits LYMPHATICS: No lymphadenopathy CARDIAC: Hear sounds regular, 2/6 systolic murmur at the apex. RESPIRATORY:  Clear to auscultation without rales, wheezing or rhonchi  ABDOMEN: Soft, non-tender, non-distended MUSCULOSKELETAL:  No edema; No deformity  SKIN: Warm and dry NEUROLOGIC:  Alert and oriented x 3 PSYCHIATRIC:  Normal affect   Signed, Jenean Lindau, MD  11/19/2019 8:28 AM  [facility]

## 2019-11-19 NOTE — Progress Notes (Signed)
No charge. Pt had to return due to lab processing error on previous test.

## 2019-11-19 NOTE — Patient Instructions (Signed)
Medication Instructions:  No medication changes *If you need a refill on your cardiac medications before your next appointment, please call your pharmacy*   Lab Work: None ordered If you have labs (blood work) drawn today and your tests are completely normal, you will receive your results only by: . MyChart Message (if you have MyChart) OR . A paper copy in the mail If you have any lab test that is abnormal or we need to change your treatment, we will call you to review the results.   Testing/Procedures: None ordered   Follow-Up: At [facility], you and your health needs are our priority.  As part of our continuing mission to provide you with exceptional heart care, we have created designated Provider Care Teams.  These Care Teams include your primary Cardiologist (physician) and Advanced Practice Providers (APPs -  Physician Assistants and Nurse Practitioners) who all work together to provide you with the care you need, when you need it.  We recommend signing up for the patient portal called "MyChart".  Sign up information is provided on this After Visit Summary.  MyChart is used to connect with patients for Virtual Visits (Telemedicine).  Patients are able to view lab/test results, encounter notes, upcoming appointments, etc.  Non-urgent messages can be sent to your provider as well.   To learn more about what you can do with MyChart, go to [email or web address].    Your next appointment:   9 month(s)  The format for your next appointment:   In Person  Provider:   Rajan Revankar, MD   Other Instructions NA 

## 2019-11-20 ENCOUNTER — Ambulatory Visit (HOSPITAL_BASED_OUTPATIENT_CLINIC_OR_DEPARTMENT_OTHER)
Admission: RE | Admit: 2019-11-20 | Discharge: 2019-11-20 | Disposition: A | Discharge status: Home / Self Care | Payer: Medicare Other | Source: Ambulatory Visit | Attending: Medical | Admitting: Medical

## 2019-11-20 DIAGNOSIS — R1011 Right upper quadrant pain: Secondary | ICD-10-CM

## 2019-11-20 LAB — H. PYLORI BREATH TEST: H. pylori Breath Test: NOT DETECTED

## 2019-11-20 LAB — VITAMIN B1: Vitamin B1 (Thiamine): 14 nmol/L (ref 8–30)

## 2019-12-18 ENCOUNTER — Other Ambulatory Visit: Payer: Self-pay

## 2019-12-18 ENCOUNTER — Ambulatory Visit (INDEPENDENT_AMBULATORY_CARE_PROVIDER_SITE_OTHER): Payer: Medicare Other | Admitting: Medical

## 2019-12-18 VITALS — BP 123/49 | HR 90 | Resp 18 | Ht 63.0 in | Wt 179.2 lb

## 2019-12-18 DIAGNOSIS — R42 Dizziness and giddiness: Secondary | ICD-10-CM | POA: Diagnosis not present

## 2019-12-18 DIAGNOSIS — R109 Unspecified abdominal pain: Secondary | ICD-10-CM

## 2019-12-18 DIAGNOSIS — G4700 Insomnia, unspecified: Secondary | ICD-10-CM | POA: Diagnosis not present

## 2019-12-18 MED ORDER — MECLIZINE HCL 12.5 MG PO TABS: 12.5000 mg | ORAL_TABLET | Freq: Three times a day (TID) | ORAL | 0 refills | Status: DC | PRN

## 2019-12-18 MED ORDER — TRAZODONE HCL 50 MG PO TABS: 25.0000 mg | ORAL_TABLET | Freq: Every evening | ORAL | 3 refills | Status: DC | PRN

## 2019-12-18 MED ORDER — FAMOTIDINE 20 MG PO TABS: 20.0000 mg | ORAL_TABLET | Freq: Two times a day (BID) | ORAL | 1 refills | Status: DC

## 2019-12-18 MED ORDER — ONDANSETRON 4 MG PO TBDP: 4.0000 mg | ORAL_TABLET | Freq: Three times a day (TID) | ORAL | 0 refills | Status: DC | PRN

## 2019-12-18 NOTE — Patient Instructions (Addendum)
For your recent mild intermittent abdomen discomfort, will get CBC, CMP and lipase.  Recommend eating bland diet and prescribing famotidine.  In addition if you have nausea can use Zofran.  Your dizziness is mostly resolved presently.  Very good neurologic exam.  If you have recurrent dizziness lasting for more than 5 to 10 minutes then could use meclizine.  That prescription was sent to your pharmacy.  If you have dizziness that is recurrent with gross motor or sensory function deficits or cardiac symptoms then recommend ED evaluation.  History of insomnia.  Prescribing trazodone.  Rx advisement given.  If insomnia does not improve then will refer to pulmonologist/sleep specialist.  History of depression and improve mood when you take the Effexor.  Recommend take daily.  Please review information on serotonin syndrome.  With low dose medication this is very unlikely.  If you have any such signs or symptoms stop both Effexor and trazodone and notify us.  Follow-up in 2 to 3 weeks or as needed.   Sndrome de la serotonina Serotonin Syndrome La serotonina es una sustancia qumica en el cuerpo (neurotransmisor) que ayuda a Chief Technology Officer varias funciones, por ejemplo:  La funcin de las clulas del sistema nervioso y del cerebro.  El Fults de nimo y las emociones.  La memoria.  Comer.  Dormir.  La actividad sexual.  [place] al estrs. Tener demasiada serotonina en el organismo pueden causar el sndrome de la serotonina. Esta afeccin puede ser perjudicial para las clulas del sistema nervioso y del cerebro. Puede ser Ardelia Mems afeccin potencialmente mortal. Cules son las causas? Esta afeccin puede ocurrir al tomar Dynegy o consumir drogas que aumentan el nivel de serotonina en el [place], tales como:  Antidepresivos.  Medicamentos para la migraa.  Determinados analgsicos.  Determinadas drogas, entre ellas, xtasis, LSD, cocana y anfetaminas.  Medicamentos de venta  libre para la tos o para el resfro que contienen dextrometorfano.  Determinados suplementos herbarios, entre ellos, [place], ginseng y [place]. Generalmente, esta afeccin ocurre cuando estos medicamentos y estas drogas se toman juntos, pero tambin puede presentarse con una dosis alta de un nico medicamento o una nica droga. Qu incrementa el riesgo? Es ms probable que usted sufra esta afeccin si:  Acaba de empezar a tomar un medicamento o una droga que aumenta el nivel de serotonina en el organismo.  Recientemente, ha aumentado la dosis de Ecologist o droga que aumenta el nivel de serotonina en el organismo.  Toma ms de un medicamento o droga que aumenta el nivel de serotonina en el organismo. Cules son los signos o los sntomas? Generalmente, los sntomas de esta afeccin se manifiestan despus de varias horas de tomar el medicamento o la droga. Los sntomas pueden ser leves o graves. Los sntomas leves incluyen:  Sudoracin.  Inquietud o agitacin.  Rigidez o temblores musculares.  Frecuencia cardaca acelerada.  Nuseas y vmitos.  Diarrea.  Dolor de [place].  [place].  Confusin. Entre los sntomas graves, se incluyen los siguientes:  Latidos Licensed conveyancer.  Convulsiones.  Prdida del conocimiento.  Fiebre alta. Cmo se diagnostica? Esta afeccin se puede diagnosticar en funcin de lo siguiente:  Sus antecedentes mdicos.  Un examen fsico.  Su uso anterior de drogas y medicamentos.  Anlisis de [place] y [place]. Estos pueden servir para descartar otras causas de sus sntomas. Cmo se trata? El tratamiento de esta afeccin depende de la gravedad de los sntomas.  Cuando los casos son leves, a  menudo todo lo que se necesita es suspender el medicamento o la droga que caus la afeccin.  Cuando los casos son moderados o graves, puede ser necesario el tratamiento en un hospital para prevenir o  controlar sntomas potencialmente mortales. Esto puede incluir medicamentos para controlar los sntomas, lquidos intravenosos (i.v.), intervenciones para ayudar con la respiracin y tratamientos para Scientist, clinical (histocompatibility and immunogenetics). Siga estas indicaciones en su casa: [place] los medicamentos de venta libre y los recetados solamente como se lo haya indicado el mdico. Esto es importante.  Consulte al mdico antes de tomar medicamentos recetados o de venta libre, hierbas o suplementos por primera vez.  No combine ningn medicamento que pueda causar esta afeccin. Estilo de vida   Lleve un estilo de vida saludable. ? Siga una dieta saludable que incluya abundantes frutas, verduras, cereales integrales, productos lcteos descremados y protenas magras. No consuma muchos alimentos con alto contenido de grasas, azcares agregados o sal. ? Duerma bien y por el tiempo adecuado. La [employer] de los adultos necesitan entre 7y9horas de sueo todas las noches. ? Dedique tiempo a Energy manager, aunque sea solo por cortos perodos de [place]. La mayora de los adultos deben hacer ejercicio durante al menos 1108minutos por semana. ? No beba alcohol. ? No consuma drogas ilegales y no tome medicamentos por otros motivos para los cuales no hayan sido recetados. Instrucciones generales  No consuma ningn producto que contenga nicotina o tabaco, como cigarrillos y Psychologist, sport and exercise. Si necesita ayuda para dejar de fumar, consulte al mdico.  Concurra a todas las visitas de seguimiento como se lo haya indicado el mdico. Esto es importante. Comunquese con un mdico si:  Los sntomas no mejoran o empeoran. Solicite ayuda inmediatamente si:  Tiene confusin que [place], dolor de cabeza intenso, dolor de [place], fiebre alta, convulsiones o prdida de la conciencia.  Los [employer] producen efectos secundarios graves, como hinchazn del rostro, los labios, la lengua o Engineer, civil (consulting).  Tiene pensamientos graves acerca de Glass blower/designer a s mismo o a Producer, television/film/video. Estos sntomas pueden representar un problema grave que constituye Engineer, maintenance (IT). No espere hasta que los sntomas desaparezcan. Solicite atencin mdica de inmediato. Comunquese con el servicio de emergencias de su localidad (911 en los [place]). No conduzca por sus propios medios Principal Financial. Si alguna vez siente que puede lastimarse a usted mismo o a Producer, television/film/video, o tiene pensamientos de poner fin a su vida, busque ayuda de inmediato. Puede dirigirse al servicio de emergencias ms cercano o comunicarse con:  El servicio de emergencias de su localidad (911 en [place]  Una lnea de asistencia al suicida y Freight forwarder en crisis, como la [employer] de Prevencin del Suicidio (National Suicide Prevention Lifeline), al [phone]. Est disponible las 24 horas del da. Resumen  La serotonina es una sustancia qumica del cerebro que ayuda a regular el sistema nervioso. Los [employer] altos de serotonina en el organismo pueden causar el sndrome de la serotonina, una afeccin muy peligrosa.  Esta afeccin puede ocurrir al tomar Dynegy o consumir drogas que aumentan el nivel de serotonina en el organismo.  El tratamiento variar segn la gravedad de los sntomas. Cuando los casos son leves, a menudo todo lo que se necesita es suspender el medicamento o la droga que caus la afeccin.  Consulte al mdico antes de tomar medicamentos recetados o de venta libre, hierbas o suplementos por primera vez. Esta informacin no tiene Marine scientist el consejo del mdico. Chief Strategy Officer  de hacerle al mdico cualquier pregunta que tenga. Document Revised: 05/19/2017 Document Reviewed: 05/19/2017 Elsevier Patient Education  2020 [employer].

## 2019-12-18 NOTE — Progress Notes (Signed)
Subjective:    Patient ID: Janet Peters, female    DOB: Nov 07, 1950, 69 y.o.   MRN: [id number]  HPI  Pt states in for follow up.  Pt states last night had some vertigo. Had all night. But now  Resolved.  No vertigo and only faint mild dizzy. No gross motor or sensory function deficits.   Pt bp 121/79 and sugar 120 last night.  Pt states last night mild upset stomach. Also mild nausea. She vomited minimal amount one time. No chest pain.  Pt states occasional will feel like this 1-2 times a year.   No diarrhea or constipation.  Pt has insomnia for long time since youth. No snoring per pt and husband.   Review of Systems  Constitutional: Negative for chills, fatigue and fever.  HENT: Negative for congestion and drooling.   Respiratory: Negative for cough, choking and wheezing.   Cardiovascular: Negative for chest pain and palpitations.  Gastrointestinal: Positive for abdominal pain and nausea. Negative for blood in stool, constipation, diarrhea and vomiting.       Minimal upset stomach.  Neurological: Positive for dizziness. Negative for speech difficulty, weakness, light-headedness and numbness.  Hematological: Negative for adenopathy. Does not bruise/bleed easily.  Psychiatric/Behavioral: Positive for dysphoric mood. Negative for behavioral problems and confusion. The patient is not nervous/anxious.     Past Medical History:  Diagnosis Date  . Diabetes mellitus without complication ([place])   . Hyperlipidemia   . Osteopenia 10/2016   T score -1.3 FRAX 3.5%/0.1%     Social History   Socioeconomic History  . Marital status: Married    Spouse name: Not on file  . Number of children: Not on file  . Years of education: Not on file  . Highest education level: Not on file  Occupational History  . Not on file  Tobacco Use  . Smoking status: Never Smoker  . Smokeless tobacco: Never Used  Vaping Use  . Vaping Use: Never used  Substance and Sexual Activity  .  Alcohol use: No  . Drug use: No  . Sexual activity: Yes    Partners: Male    Comment: 1st intercourse- 53, partners - 2, married- 41 yrs  Other Topics Concern  . Not on file  Social History Narrative  . Not on file   Social Determinants of Health   Financial Resource Strain:   . Difficulty of Paying Living Expenses: Not on file  Food Insecurity:   . Worried About Charity fundraiser in the Last Year: Not on file  . Ran Out of Food in the Last Year: Not on file  Transportation Needs:   . Lack of Transportation (Medical): Not on file  . Lack of Transportation (Non-Medical): Not on file  Physical Activity:   . Days of Exercise per Week: Not on file  . Minutes of Exercise per Session: Not on file  Stress:   . Feeling of Stress : Not on file  Social Connections:   . Frequency of Communication with Friends and Family: Not on file  . Frequency of Social Gatherings with Friends and Family: Not on file  . Attends Religious Services: Not on file  . Active Member of Clubs or Organizations: Not on file  . Attends Archivist Meetings: Not on file  . Marital Status: Not on file  Intimate Partner Violence:   . Fear of Current or Ex-Partner: Not on file  . Emotionally Abused: Not on file  . Physically Abused: Not  on file  . Sexually Abused: Not on file    Past Surgical History:  Procedure Laterality Date  . ABDOMINAL HYSTERECTOMY    . CATARACT EXTRACTION Left   . CESAREAN SECTION     x2  . CHOLECYSTECTOMY, LAPAROSCOPIC    . KNEE SURGERY Left 2011    Family History  Problem Relation Age of Onset  . Heart attack Maternal Grandmother     No Known Allergies  Current Outpatient Medications on File Prior to Visit  Medication Sig Dispense Refill  . Accu-Chek Softclix Lancets lancets TEST TWICE DAILY 200 each 0  . atorvastatin (LIPITOR) 10 MG tablet TAKE 1 TABLET BY MOUTH DAILY 90 tablet 1  . Blood Glucose Calibration (ACCU-CHEK COMPACT PLUS CONTROL) SOLN 1 each by In  Vitro route daily as needed. 1 each 1  . Cholecalciferol (VITAMIN D3) 5000 units CAPS Take by mouth.    . fluticasone (FLONASE) 50 MCG/ACT nasal spray SHAKE LIQUID AND USE 2 SPRAYS IN EACH NOSTRIL DAILY 48 g 1  . glucose blood (ACCU-CHEK AVIVA PLUS) test strip USE TWICE DAILY AS DIRECTED.  DX CODE: E11.9 200 each 1  . levocetirizine (XYZAL) 5 MG tablet Take 1 tablet (5 mg total) by mouth every evening. 30 tablet 3  . metFORMIN (GLUCOPHAGE) 500 MG tablet Take 1 tablet (500 mg total) by mouth 2 (two) times daily with a meal. 180 tablet 3  . venlafaxine XR (EFFEXOR XR) 37.5 MG 24 hr capsule Take 1 capsule (37.5 mg total) by mouth daily with breakfast. (Patient not taking: Reported on 11/19/2019) 30 capsule 0  . Vitamin D, Ergocalciferol, (DRISDOL) 1.25 MG (50000 UNIT) CAPS capsule Take 1 capsule (50,000 Units total) by mouth every 7 (seven) days. 12 capsule 0   No current facility-administered medications on file prior to visit.    BP (!) 123/49   Pulse 90   Resp 18   Ht 5\' 3"  (1.6 m)   Wt 179 lb 3.2 oz (81.3 kg)   SpO2 100%   BMI 31.74 kg/m      Objective:   Physical Exam  General Mental Status- Alert. General Appearance- Not in acute distress.   Skin General: Color- Normal Color. Moisture- Normal Moisture.  Neck Carotid Arteries- Normal color. Moisture- Normal Moisture. No carotid bruits. No JVD.  Chest and Lung Exam Auscultation: Breath Sounds:-Normal.  Cardiovascular Auscultation:Rythm- Regular. Murmurs & Other Heart Sounds:Auscultation of the heart reveals- No Murmurs.  Abdomen Inspection:-Inspeection Normal. Palpation/Percussion:Note:No mass. Palpation and Percussion of the abdomen reveal- Non Tender, Non Distended + BS, no rebound or guarding.  Neurologic Cranial Nerve exam:- CN III-XII intact(No nystagmus), symmetric smile. Drift Test:- No drift. Finger to Nose:- Normal/Intact Strength:- 5/5 equal and symmetric strength both upper and lower extremities.        Assessment & Plan:  For your recent mild intermittent abdomen discomfort, will get CBC, CMP and lipase.  Recommend eating bland diet and prescribing famotidine.  In addition if you have nausea can use Zofran.  Your dizziness is mostly resolved presently.  Very good neurologic exam.  If you have recurrent dizziness lasting for more than 5 to 10 minutes then could use meclizine.  That prescription was sent to your pharmacy.  If you have dizziness that is recurrent with gross motor or sensory function deficits or cardiac symptoms then recommend ED evaluation.  History of insomnia.  Prescribing trazodone.  Rx advisement given.  If insomnia does not improve then will refer to pulmonologist/sleep specialist.  History of depression and  improve mood when you take the Effexor.  Recommend take daily.  Please review information on serotonin syndrome.  With low dose medication this is very unlikely.  If you have any such signs or symptoms stop both Effexor and trazodone and notify us.  Follow-up in 2 to 3 weeks or as needed.  Mackie Pai, PA-C   Time spent with patient today was 40  minutes which consisted of chart review, discussing diagnoses, work up, treatment, answering questions on pt concern of med interactions  and documentation.

## 2019-12-19 LAB — CBC WITH DIFFERENTIAL/PLATELET
Absolute Monocytes: 493 cells/uL (ref 200–950)
Basophils Absolute: 37 cells/uL (ref 0–200)
Basophils Relative: 0.4 %
Eosinophils Absolute: 214 cells/uL (ref 15–500)
Eosinophils Relative: 2.3 %
HCT: 38.2 % (ref 35.0–45.0)
Hemoglobin: 12.9 g/dL (ref 11.7–15.5)
Lymphs Abs: 2316 cells/uL (ref 850–3900)
MCH: 29.7 pg (ref 27.0–33.0)
MCHC: 33.8 g/dL (ref 32.0–36.0)
MCV: 88 fL (ref 80.0–100.0)
MPV: 9.7 fL (ref 7.5–12.5)
Monocytes Relative: 5.3 %
Neutro Abs: 6240 cells/uL (ref 1500–7800)
Neutrophils Relative %: 67.1 %
Platelets: 337 10*3/uL (ref 140–400)
RBC: 4.34 10*6/uL (ref 3.80–5.10)
RDW: 13.9 % (ref 11.0–15.0)
Total Lymphocyte: 24.9 %
WBC: 9.3 10*3/uL (ref 3.8–10.8)

## 2019-12-19 LAB — COMPREHENSIVE METABOLIC PANEL
AG Ratio: 1.5 (calc) (ref 1.0–2.5)
ALT: 19 U/L (ref 6–29)
AST: 17 U/L (ref 10–35)
Albumin: 4.4 g/dL (ref 3.6–5.1)
Alkaline phosphatase (APISO): 109 U/L (ref 37–153)
BUN: 14 mg/dL (ref 7–25)
CO2: 24 mmol/L (ref 20–32)
Calcium: 9.4 mg/dL (ref 8.6–10.4)
Chloride: 104 mmol/L (ref 98–110)
Creat: 0.56 mg/dL (ref 0.50–0.99)
Globulin: 3 g/dL (calc) (ref 1.9–3.7)
Glucose, Bld: 122 mg/dL — ABNORMAL HIGH (ref 65–99)
Potassium: 4.3 mmol/L (ref 3.5–5.3)
Sodium: 140 mmol/L (ref 135–146)
Total Bilirubin: 0.5 mg/dL (ref 0.2–1.2)
Total Protein: 7.4 g/dL (ref 6.1–8.1)

## 2019-12-19 LAB — LIPASE: Lipase: 9 U/L (ref 7–60)

## 2020-01-01 ENCOUNTER — Ambulatory Visit (INDEPENDENT_AMBULATORY_CARE_PROVIDER_SITE_OTHER): Payer: Medicare Other | Admitting: Medical

## 2020-01-01 ENCOUNTER — Other Ambulatory Visit: Payer: Self-pay

## 2020-01-01 ENCOUNTER — Encounter: Payer: Self-pay | Admitting: Medical

## 2020-01-01 VITALS — BP 120/70 | HR 96 | Ht 63.0 in | Wt 178.0 lb

## 2020-01-01 DIAGNOSIS — I10 Essential (primary) hypertension: Secondary | ICD-10-CM | POA: Diagnosis not present

## 2020-01-01 DIAGNOSIS — G4700 Insomnia, unspecified: Secondary | ICD-10-CM | POA: Diagnosis not present

## 2020-01-01 DIAGNOSIS — S46811A Strain of other muscles, fascia and tendons at shoulder and upper arm level, right arm, initial encounter: Secondary | ICD-10-CM | POA: Diagnosis not present

## 2020-01-01 DIAGNOSIS — J309 Allergic rhinitis, unspecified: Secondary | ICD-10-CM

## 2020-01-01 DIAGNOSIS — F32A Depression, unspecified: Secondary | ICD-10-CM

## 2020-01-01 MED ORDER — CYCLOBENZAPRINE HCL 5 MG PO TABS: 5.0000 mg | ORAL_TABLET | Freq: Every day | ORAL | 0 refills | Status: DC

## 2020-01-01 NOTE — Patient Instructions (Signed)
You do appear to have right side trapezius muscle pain on palpation.  I think there is is likely due to/related to stress.  Will prescribe Flexeril 5 mg tablets to take 1 tablet at night for the next 4 nights.  Give me an update by early next week as to whether pain has resolved.  Could also use Tylenol to help with pain as well.  Also massage of muscle may help as well.  History of hypertension.  Blood pressure well controlled today.  You have recent mild allergy symptoms.  History of allergies during the fall.  I recommend that you take your Flonase daily.  Depression and insomnia improved with Effexor and trazodone.  However over the next 4 days I recommend not using trazodone since Flexeril is likely only to sedate you/help with sleep.  After he finished with Flexeril then resume trazodone.  Follow-up in 2 weeks or as needed.

## 2020-01-01 NOTE — Progress Notes (Signed)
° °  Subjective:    Patient ID: Janet Peters, female    DOB: [birth year], 69 y.o.   MRN: [id number]  HPI  Pt in with some neck pain. Pt states hx of neck pain in past. She states in past rt side trapezius. She also states pressure on back of her head. She points to occipital region. Pain level 4/10. No gross motor or sensory function deficits. No vertigo. Possible minimal transient 1 second imbalance sensation on rare occasion.  Pt has hx of depression and insomnia. Pt on effexor and trazadone. Feels better.  No mid cervical spine pain.  She states will have some pain in occipital region. When that pain stops pain will start in rt trapezius area.  Pt allergy symptoms during fall. Mild head pressure.no runny nose. Occasional sneezing. No fever,no chills,no sweats, no changes to smell, no body aches, no sob, no cough and no wheezing. Pt has not been on her flonase past 3 days.     Review of Systems     Objective:   Physical Exam  General Mental Status- Alert. General Appearance- Not in acute distress.   Skin General: Color- Normal Color. Moisture- Normal Moisture.  Neck No mid cervical spine pain.  Right side trapezius tenderness to palpation throughout.  Chest and Lung Exam Auscultation: Breath Sounds:-Normal.  Cardiovascular Auscultation:Rythm- Regular. Murmurs & Other Heart Sounds:Auscultation of the heart reveals- No Murmurs.  Abdomen Inspection:-Inspeection Normal. Palpation/Percussion:Note:No mass. Palpation and Percussion of the abdomen reveal- Non Tender, Non Distended + BS, no rebound or guarding.    Neurologic Cranial Nerve exam:- CN III-XII intact(No nystagmus), symmetric smile. Drift Test:- No drift. Finger to Nose:- Normal/Intact Strength:- 5/5 equal and symmetric strength both upper and lower extremities.      Assessment & Plan:  You do appear to have right side trapezius muscle pain on palpation.  I think there is is likely due to/related to  stress.  Will prescribe Flexeril 5 mg tablets to take 1 tablet at night for the next 4 nights.  Give me an update by early next week as to whether pain has resolved.  Could also use Tylenol to help with pain as well.  Also massage of muscle may help as well.  History of hypertension.  Blood pressure well controlled today.  You have recent mild allergy symptoms.  History of allergies during the fall.  I recommend that you take your Flonase daily.  Depression and insomnia improved with Effexor and trazodone.  However over the next 4 days I recommend not using trazodone since Flexeril is likely only to sedate you/help with sleep.  After he finished with Flexeril then resume trazodone.  Follow-up in 2 weeks or as needed.

## 2020-01-13 ENCOUNTER — Ambulatory Visit: Payer: Medicare Other | Admitting: Medical

## 2020-01-28 ENCOUNTER — Other Ambulatory Visit: Payer: Self-pay | Admitting: Medical

## 2020-02-11 ENCOUNTER — Encounter: Payer: Self-pay | Admitting: Internal Medicine

## 2020-02-13 ENCOUNTER — Other Ambulatory Visit: Payer: Self-pay | Admitting: Medical

## 2020-02-13 DIAGNOSIS — E785 Hyperlipidemia, unspecified: Secondary | ICD-10-CM

## 2020-02-25 ENCOUNTER — Other Ambulatory Visit: Payer: Self-pay | Admitting: Medical

## 2020-02-28 ENCOUNTER — Ambulatory Visit: Payer: Medicare Other | Attending: Internal Medicine

## 2020-02-28 DIAGNOSIS — Z23 Encounter for immunization: Secondary | ICD-10-CM

## 2020-02-28 NOTE — Progress Notes (Signed)
   Covid-19 Vaccination Clinic  Name:  Regine Christian    MRN: [id number] DOB: [birth year]  02/28/2020  Ms. Adeola Dennen was observed post Covid-19 immunization for 15 minutes without incident. She was provided with Vaccine Information Sheet and instruction to access the V-Safe system.   Ms. Anaih Brander was instructed to call 911 with any severe reactions post vaccine: Marland Kitchen Difficulty breathing  . Swelling of face and throat  . A fast heartbeat  . A bad rash all over body  . Dizziness and weakness   Immunizations Administered    Name Date Dose VIS Date Route   Pfizer COVID-19 Vaccine 02/28/2020  9:20 AM 0.3 mL 01/14/2020 Intramuscular   Manufacturer: [place]   Lot: [id number]   NDC: [address]-8

## 2020-03-27 HISTORY — PX: COLONOSCOPY: SHX174

## 2020-04-08 ENCOUNTER — Other Ambulatory Visit: Payer: Self-pay

## 2020-04-08 ENCOUNTER — Ambulatory Visit (AMBULATORY_SURGERY_CENTER): Payer: Self-pay

## 2020-04-08 VITALS — Ht 63.0 in | Wt 177.0 lb

## 2020-04-08 DIAGNOSIS — Z1211 Encounter for screening for malignant neoplasm of colon: Secondary | ICD-10-CM

## 2020-04-08 NOTE — Progress Notes (Signed)
Spanish interpreter present for pre visit today; No egg or soy allergy known to patient  No issues with past sedation with any surgeries or procedures No intubation problems in the past  No FH of Malignant Hyperthermia No diet pills per patient No home 02 use per patient  No blood thinners per patient  Pt denies issues with constipation  No A fib or A flutter  EMMI video via [place] 19 guidelines implemented in PV today with Pt and RN  Pt is fully vaccinated  for Covid + booster; Due to the COVID-19 pandemic we are asking patients to follow certain guidelines.  Pt aware of COVID protocols and [facility] guidelines

## 2020-04-12 ENCOUNTER — Encounter: Payer: Self-pay | Admitting: Internal Medicine

## 2020-04-15 ENCOUNTER — Ambulatory Visit (AMBULATORY_SURGERY_CENTER): Payer: Medicare Other | Admitting: Internal Medicine

## 2020-04-15 ENCOUNTER — Other Ambulatory Visit: Payer: Self-pay

## 2020-04-15 ENCOUNTER — Encounter: Payer: Self-pay | Admitting: Internal Medicine

## 2020-04-15 VITALS — BP 140/70 | HR 76 | Temp 98.0°F | Resp 13 | Ht 63.0 in | Wt 177.0 lb

## 2020-04-15 DIAGNOSIS — D123 Benign neoplasm of transverse colon: Secondary | ICD-10-CM

## 2020-04-15 DIAGNOSIS — D122 Benign neoplasm of ascending colon: Secondary | ICD-10-CM

## 2020-04-15 DIAGNOSIS — Z1211 Encounter for screening for malignant neoplasm of colon: Secondary | ICD-10-CM

## 2020-04-15 MED ORDER — SODIUM CHLORIDE 0.9 % IV SOLN: 500.0000 mL | Freq: Once | INTRAVENOUS | Status: DC

## 2020-04-15 NOTE — Progress Notes (Signed)
Report given to PACU, vss 

## 2020-04-15 NOTE — Progress Notes (Signed)
Vitals by CW Pt is spanish speaking.  Interpreter Sonia to help with translation.

## 2020-04-15 NOTE — Progress Notes (Signed)
Called to room to assist during endoscopic procedure.  Patient ID and intended procedure confirmed with present staff. Received instructions for my participation in the procedure from the performing physician.  

## 2020-04-15 NOTE — Patient Instructions (Signed)
Please read handouts provided. Continue present medications. Await pathology results.   USTED TUVO UN PROCEDIMIENTO ENDOSCPICO HOY EN EL [facility]:   Lea el informe del procedimiento que se le entreg para cualquier pregunta especfica sobre lo que se encontr durante su examen.  Si el informe del examen no responde a sus preguntas, por favor llame a su gastroenterlogo para aclararlo.  Si usted solicit que no se le den detalles de lo que se encontr en su procedimiento al acompaante que le va a cuidar, entonces el informe del procedimiento se ha incluido en un sobre sellado para que usted lo revise despus cuando le sea ms conveniente.   LO QUE PUEDE ESPERAR: Algunas sensaciones de hinchazn en el abdomen.  Puede tener ms gases de lo normal.  El caminar puede ayudarle a eliminar el aire que se le puso en el tracto gastrointestinal durante el procedimiento y reducir la hinchazn.  Si le hicieron una endoscopia inferior (como una colonoscopia o una sigmoidoscopia flexible), podra notar manchas de sangre en las heces fecales o en el papel higinico.  Si se someti a una preparacin intestinal para su procedimiento, es posible que no tenga una evacuacin intestinal normal durante algunos das.   Tenga en cuenta:  Es posible que note un poco de irritacin y congestin en la nariz o algn drenaje.  Esto es debido al oxgeno utilizado durante su procedimiento.  No hay que preocuparse y esto debe desaparecer ms o menos en un da.   SNTOMAS PARA REPORTAR INMEDIATAMENTE:  Despus de una endoscopia inferior (colonoscopia o sigmoidoscopia flexible):  Cantidades excesivas de sangre en las heces fecales  Sensibilidad significativa o empeoramiento de los dolores abdominales   Hinchazn aguda del abdomen que antes no tena   Fiebre de 100F o ms    Para asuntos urgentes o de emergencia, puede comunicarse con un gastroenterlogo a cualquier hora llamando al [phone].  DIETA:   Recomendamos una comida pequea al principio, pero luego puede continuar con su dieta normal.  Tome muchos lquidos, pero debe evitar las bebidas alcohlicas durante 24 horas.    ACTIVIDAD:  Debe planear tomarse las cosas con calma por el resto del da y no debe CONDUCIR ni usar maquinaria pesada hasta maana (debido a los medicamentos de sedacin utilizados durante el examen).     SEGUIMIENTO: Nuestro personal llamar al nmero que aparece en su historial al siguiente da hbil de su procedimiento para ver cmo se siente y para responder cualquier pregunta o inquietud que pueda tener con respecto a la informacin que se le dio despus del procedimiento. Si no podemos contactarle, le dejaremos un mensaje.  Sin embargo, si se siente bien y no tiene ningn problema, no es necesario que nos devuelva la llamada.  Asumiremos que ha regresado a sus actividades diarias normales sin incidentes. Si se le tomaron algunas biopsias, le contactaremos por telfono o por carta en las prximas 3 semanas.  Si no ha sabido nada sobre las biopsias en el transcurso de 3 semanas, por favor llmenos al [phone].   FIRMAS/CONFIDENCIALIDAD: Usted y/o el acompaante que le cuide han firmado documentos que se ingresarn en su historial mdico electrnico.  Estas firmas atestiguan el hecho de que la informacin anterior   

## 2020-04-15 NOTE — Op Note (Signed)
[facility] Patient Name: Janet Peters Procedure Date: 04/15/2020 8:48 AM MRN: [id number] Endoscopist: Jerene Bears , MD Age: 70 Referring MD:  Date of Birth: 18-Mar-1951 Gender: Female Account #: [id number] Procedure:                Colonoscopy Indications:              Screening for colorectal malignant neoplasm, Last                            colonoscopy: 2011 Medicines:                Monitored Anesthesia Care Procedure:                Pre-Anesthesia Assessment:                           - Prior to the procedure, a History and Physical                            was performed, and patient medications and                            allergies were reviewed. The patient's tolerance of                            previous anesthesia was also reviewed. The risks                            and benefits of the procedure and the sedation                            options and risks were discussed with the patient.                            All questions were answered, and informed consent                            was obtained. Prior Anticoagulants: The patient has                            taken no previous anticoagulant or antiplatelet                            agents. ASA Grade Assessment: II - A patient with                            mild systemic disease. After reviewing the risks                            and benefits, the patient was deemed in                            satisfactory condition to undergo the procedure.  After obtaining informed consent, the colonoscope                            was passed under direct vision. Throughout the                            procedure, the patient's blood pressure, pulse, and                            oxygen saturations were monitored continuously. The                            Olympus [id number]-[id number]) Colonoscope was                            introduced through the anus and advanced  to the                            cecum, identified by appendiceal orifice and                            ileocecal valve. The colonoscopy was performed                            without difficulty. The patient tolerated the                            procedure well. The quality of the bowel                            preparation was good. The ileocecal valve,                            appendiceal orifice, and rectum were photographed. Scope In: 9:01:13 AM Scope Out: 9:25:03 AM Scope Withdrawal Time: 0 hours 18 minutes 41 seconds  Total Procedure Duration: 0 hours 23 minutes 50 seconds  Findings:                 The digital rectal exam was normal.                           A 7 mm polyp was found in the ascending colon. The                            polyp was sessile. The polyp was removed with a                            cold snare. Resection and retrieval were complete.                           Two sessile polyps were found in the transverse                            colon. The polyps were 4 to 5  mm in size. These                            polyps were removed with a cold snare. Resection                            and retrieval were complete.                           Multiple small-mouthed diverticula were found in                            the sigmoid colon and distal descending colon.                           Internal hemorrhoids were found during                            retroflexion. The hemorrhoids were small. Complications:            No immediate complications. Estimated Blood Loss:     Estimated blood loss was minimal. Impression:               - One 7 mm polyp in the ascending colon, removed                            with a cold snare. Resected and retrieved.                           - Two 4 to 5 mm polyps in the transverse colon,                            removed with a cold snare. Resected and retrieved.                           - Diverticulosis in the sigmoid  colon and in the                            distal descending colon.                           - Internal hemorrhoids. Recommendation:           - Patient has a contact number available for                            emergencies. The signs and symptoms of potential                            delayed complications were discussed with the                            patient. Return to normal activities tomorrow.                            Written discharge  instructions were provided to the                            patient.                           - Resume previous diet.                           - Continue present medications.                           - Await pathology results.                           - Repeat colonoscopy is recommended. The                            colonoscopy date will be determined after pathology                            results from today's exam become available for                            review. Jerene Bears, MD 04/15/2020 9:34:10 AM This report has been signed electronically.

## 2020-04-19 ENCOUNTER — Other Ambulatory Visit (HOSPITAL_BASED_OUTPATIENT_CLINIC_OR_DEPARTMENT_OTHER): Payer: Self-pay | Admitting: Medical

## 2020-04-19 ENCOUNTER — Telehealth: Payer: Self-pay

## 2020-04-19 DIAGNOSIS — Z1231 Encounter for screening mammogram for malignant neoplasm of breast: Secondary | ICD-10-CM

## 2020-04-19 NOTE — Telephone Encounter (Signed)
  Follow up Call-  Call back number 04/15/2020  Post procedure Call Back phone  # [phone]  Permission to leave phone message Yes  Some recent data might be hidden     Patient questions:  Do you have a fever, pain , or abdominal swelling? No. Pain Score  0   Have you tolerated food without any problems? Yes.    Have you been able to return to your normal activities? Yes.    Do you have any questions about your discharge instructions: Diet   No. Medications  No. Follow up visit  No.  Do you have questions or concerns about your Care? No.  Actions: * If pain score is 4 or above: No action needed, pain <4.  1. Have you developed a fever since your procedure? no  2.   Have you had an respiratory symptoms (SOB or cough) since your procedure? no  3.   Have you tested positive for COVID 19 since your procedure no  4.   Have you had any family members/close contacts diagnosed with the COVID 19 since your procedure?  no   If yes to any of these questions please route to Joylene John, RN and Joella Prince, RN

## 2020-04-22 ENCOUNTER — Encounter: Payer: Self-pay | Admitting: Internal Medicine

## 2020-04-26 ENCOUNTER — Other Ambulatory Visit: Payer: Self-pay

## 2020-04-27 ENCOUNTER — Encounter: Payer: Self-pay | Admitting: Medical

## 2020-04-27 ENCOUNTER — Ambulatory Visit (INDEPENDENT_AMBULATORY_CARE_PROVIDER_SITE_OTHER): Payer: Medicare Other | Admitting: Medical

## 2020-04-27 VITALS — BP 133/57 | HR 92 | Resp 18 | Ht 63.0 in | Wt 177.0 lb

## 2020-04-27 DIAGNOSIS — Z794 Long term (current) use of insulin: Secondary | ICD-10-CM | POA: Diagnosis not present

## 2020-04-27 DIAGNOSIS — R7989 Other specified abnormal findings of blood chemistry: Secondary | ICD-10-CM

## 2020-04-27 DIAGNOSIS — G4700 Insomnia, unspecified: Secondary | ICD-10-CM | POA: Diagnosis not present

## 2020-04-27 DIAGNOSIS — E118 Type 2 diabetes mellitus with unspecified complications: Secondary | ICD-10-CM | POA: Diagnosis not present

## 2020-04-27 DIAGNOSIS — E785 Hyperlipidemia, unspecified: Secondary | ICD-10-CM

## 2020-04-27 DIAGNOSIS — F32A Depression, unspecified: Secondary | ICD-10-CM | POA: Diagnosis not present

## 2020-04-27 LAB — LDL CHOLESTEROL, DIRECT: Direct LDL: 96 mg/dL

## 2020-04-27 LAB — COMPREHENSIVE METABOLIC PANEL
ALT: 20 U/L (ref 0–35)
AST: 18 U/L (ref 0–37)
Albumin: 4.4 g/dL (ref 3.5–5.2)
Alkaline Phosphatase: 102 U/L (ref 39–117)
BUN: 13 mg/dL (ref 6–23)
CO2: 28 mEq/L (ref 19–32)
Calcium: 9.6 mg/dL (ref 8.4–10.5)
Chloride: 104 mEq/L (ref 96–112)
Creatinine, Ser: 0.6 mg/dL (ref 0.40–1.20)
GFR: 91.39 mL/min (ref >60.00)
Glucose, Bld: 113 mg/dL — ABNORMAL HIGH (ref 70–99)
Potassium: 4.2 mEq/L (ref 3.5–5.1)
Sodium: 139 mEq/L (ref 135–145)
Total Bilirubin: 0.4 mg/dL (ref 0.2–1.2)
Total Protein: 7.3 g/dL (ref 6.0–8.3)

## 2020-04-27 LAB — LIPID PANEL
Cholesterol: 174 mg/dL (ref 0–200)
HDL: 51 mg/dL (ref >39.00)
NonHDL: 123.06
Total CHOL/HDL Ratio: 3
Triglycerides: 220 mg/dL — ABNORMAL HIGH (ref 0.0–149.0)
VLDL: 44 mg/dL — ABNORMAL HIGH (ref 0.0–40.0)

## 2020-04-27 LAB — HEMOGLOBIN A1C: Hgb A1c MFr Bld: 6.9 % — ABNORMAL HIGH (ref 4.6–6.5)

## 2020-04-27 NOTE — Progress Notes (Signed)
Subjective:    Patient ID: Janet Peters, female    DOB: [birth year], 70 y.o.   MRN: [id number]  HPI  Pt in for follow up.  Pt had colonoscopy since last visit. 3 polyps. Letter in chart states repeat in 3 yrs. I notified.  Pt depression is improved with effexor.  Has insomnia. Pt is on trazadone. She states 1/2 tab does not help her sleep.   Pt has high cholesterol. She is on atorvastatin.  Pt is diabetic and on metformin. Last a1c was 6.7.  Low vit D at 30.25 5 month ago.  Pt is fasting today.      Review of Systems  Constitutional: Negative for activity change, chills, diaphoresis, fatigue and fever.  Respiratory: Negative for cough, chest tightness and shortness of breath.   Cardiovascular: Negative for chest pain, palpitations and leg swelling.  Gastrointestinal: Negative for abdominal pain, nausea and vomiting.  Musculoskeletal: Negative for back pain, neck pain and neck stiffness.  Neurological: Negative for dizziness, seizures, weakness and light-headedness.  Hematological: Negative for adenopathy. Does not bruise/bleed easily.  Psychiatric/Behavioral: Negative for agitation, behavioral problems and confusion. The patient is not nervous/anxious.    Past Medical History:  Diagnosis Date  . Allergy    seasonal allergies  . Anxiety    situational   . Arthritis    bilateral knees  . Bradycardia    per pt report  . Depression    situational   . Diabetes mellitus without complication ([place])    on meds  . Hyperlipidemia    on meds  . Osteopenia 10/2016   T score -1.3 FRAX 3.5%/0.1%     Social History   Socioeconomic History  . Marital status: Married    Spouse name: Not on file  . Number of children: Not on file  . Years of education: Not on file  . Highest education level: Not on file  Occupational History  . Not on file  Tobacco Use  . Smoking status: Never Smoker  . Smokeless tobacco: Never Used  Vaping Use  . Vaping Use: Never used   Substance and Sexual Activity  . Alcohol use: No  . Drug use: No  . Sexual activity: Yes    Partners: Male    Comment: 1st intercourse- 27, partners - 2, married- 61 yrs  Other Topics Concern  . Not on file  Social History Narrative  . Not on file   Social Determinants of Health   Financial Resource Strain: Not on file  Food Insecurity: Not on file  Transportation Needs: Not on file  Physical Activity: Not on file  Stress: Not on file  Social Connections: Not on file  Intimate Partner Violence: Not on file    Past Surgical History:  Procedure Laterality Date  . CATARACT EXTRACTION Left   . CESAREAN SECTION     x2  . CHOLECYSTECTOMY, LAPAROSCOPIC  2010  . KNEE ARTHROSCOPY W/ MENISCAL REPAIR Left 2011  . PARTIAL HYSTERECTOMY  1988    Family History  Problem Relation Age of Onset  . Heart attack Maternal Grandmother   . Colon polyps Neg Hx   . Colon cancer Neg Hx   . Esophageal cancer Neg Hx   . Stomach cancer Neg Hx   . Rectal cancer Neg Hx     No Known Allergies  Current Outpatient Medications on File Prior to Visit  Medication Sig Dispense Refill  . Accu-Chek Softclix Lancets lancets TEST TWICE DAILY 200 each 0  .  atorvastatin (LIPITOR) 10 MG tablet TAKE 1 TABLET BY MOUTH DAILY 90 tablet 1  . Blood Glucose Calibration (ACCU-CHEK COMPACT PLUS CONTROL) SOLN 1 each by In Vitro route daily as needed. 1 each 1  . Cholecalciferol (VITAMIN D3) 5000 units CAPS Take by mouth.    . fluticasone (FLONASE) 50 MCG/ACT nasal spray SHAKE LIQUID AND USE 2 SPRAYS IN EACH NOSTRIL DAILY (Patient taking differently: daily as needed.) 48 g 1  . glucose blood (ACCU-CHEK AVIVA PLUS) test strip USE TWICE DAILY AS DIRECTED.  DX CODE: E11.9 200 each 1  . metFORMIN (GLUCOPHAGE) 500 MG tablet TAKE 1 TABLET(500 MG) BY MOUTH TWICE DAILY WITH A MEAL 180 tablet 3  . prednisoLONE acetate (PRED FORTE) 1 % ophthalmic suspension in the morning and at bedtime.    . traZODone (DESYREL) 50 MG tablet  Take 0.5-1 tablets (25-50 mg total) by mouth at bedtime as needed for sleep. 30 tablet 3  . Vitamin D, Ergocalciferol, (DRISDOL) 1.25 MG (50000 UNIT) CAPS capsule TAKE 1 CAPSULE BY MOUTH EVERY 7 DAYS 12 capsule 0   No current facility-administered medications on file prior to visit.    BP (!) 133/57   Pulse 92   Resp 18   Ht 5\' 3"  (1.6 m)   Wt 177 lb (80.3 kg)   SpO2 98%   BMI 31.35 kg/m       Objective:   Physical Exam  General Mental Status- Alert. General Appearance- Not in acute distress.   Skin General: Color- Normal Color. Moisture- Normal Moisture.  Neck Carotid Arteries- Normal color. Moisture- Normal Moisture. No carotid bruits. No JVD.  Chest and Lung Exam Auscultation: Breath Sounds:-Normal.  Cardiovascular Auscultation:Rythm- Regular. Murmurs & Other Heart Sounds:Auscultation of the heart reveals- No Murmurs.  Abdomen Inspection:-Inspeection Normal. Palpation/Percussion:Note:No mass. Palpation and Percussion of the abdomen reveal- Non Tender, Non Distended + BS, no rebound or guarding.    Neurologic Cranial Nerve exam:- CN III-XII intact(No nystagmus), symmetric smile. Strength:- 5/5 equal and symmetric strength both upper and lower extremities.      Assessment & Plan:  Diabetes. Last a1c 6.7. Continue metformin. Adjust dose or add med if necesssary after a1c review..  Hyperlipidemia. Recheck cmp and lipid panel today.  For depression controlled. Continue effexor.  For insomnia not ideal controlled advise take trazadone 50 mg dose at night. Can also try melatonin 5-10 mg at night.  Low vit D. Repeat level today.  Follow up 3 months or as needed

## 2020-04-27 NOTE — Patient Instructions (Addendum)
Diabetes. Last a1c 6.7. Continue metformin. Adjust dose or add med if necesssary after a1c review..  Hyperlipidemia. Recheck cmp and lipid panel today.  For depression controlled. Continue effexor.  For insomnia not ideal controlled advise take trazadone 50 mg dose at night. Can also try melatonin 5-10 mg at night.  Low vit D. Repeat level today.  Follow up 3 months or as needed

## 2020-04-30 LAB — VITAMIN D 1,25 DIHYDROXY
Vitamin D 1, 25 (OH)2 Total: 30 pg/mL (ref 18–72)
Vitamin D2 1, 25 (OH)2: <8 pg/mL
Vitamin D3 1, 25 (OH)2: 30 pg/mL

## 2020-05-17 ENCOUNTER — Ambulatory Visit: Payer: Medicare Other | Admitting: Family Medicine

## 2020-05-18 ENCOUNTER — Ambulatory Visit (INDEPENDENT_AMBULATORY_CARE_PROVIDER_SITE_OTHER): Payer: Medicare Other | Admitting: Medical

## 2020-05-18 ENCOUNTER — Other Ambulatory Visit: Payer: Self-pay

## 2020-05-18 VITALS — BP 127/60 | HR 91 | Temp 98.3°F | Resp 18 | Ht 63.0 in | Wt 178.0 lb

## 2020-05-18 DIAGNOSIS — Z794 Long term (current) use of insulin: Secondary | ICD-10-CM

## 2020-05-18 DIAGNOSIS — E118 Type 2 diabetes mellitus with unspecified complications: Secondary | ICD-10-CM

## 2020-05-18 DIAGNOSIS — E782 Mixed hyperlipidemia: Secondary | ICD-10-CM | POA: Diagnosis not present

## 2020-05-18 DIAGNOSIS — G4700 Insomnia, unspecified: Secondary | ICD-10-CM

## 2020-05-18 DIAGNOSIS — M545 Low back pain, unspecified: Secondary | ICD-10-CM | POA: Diagnosis not present

## 2020-05-18 DIAGNOSIS — R21 Rash and other nonspecific skin eruption: Secondary | ICD-10-CM

## 2020-05-18 MED ORDER — CYCLOBENZAPRINE HCL 5 MG PO TABS: 5.0000 mg | ORAL_TABLET | Freq: Every day | ORAL | 0 refills | Status: DC

## 2020-05-18 MED ORDER — TRIAMCINOLONE ACETONIDE 0.1 % EX CREA: 1.0000 "application " | TOPICAL_CREAM | Freq: Two times a day (BID) | CUTANEOUS | 1 refills | Status: DC

## 2020-05-18 NOTE — Addendum Note (Signed)
Addended by: Anabel Halon on: 05/18/2020 08:48 AM   Modules accepted: Orders

## 2020-05-18 NOTE — Patient Instructions (Addendum)
Your back pain is in sciatic area with more left side features. Recommend can increase dose of your advil. Check dose and can use up to 400 mg- 800 mg 3 times daily over next week. Will add low dose flexeril 5 mgl to use at night. If pain worsens or changes notify us. Could repeat lumbar xray to see if interval change since last xray.  For diabetes continue metformin 500 mg twice daily.  For high cholesterol continue atorvastatin 10 mg daily.  For insomnia not ideal controlled. continue trazadone but can add melatonin 5 mg dose at night.   For rash left lower ext recommend moisturizer twice daily and use triamcinolone cream twice daily.  Follow up in 2-3 weeks or as needed.

## 2020-05-18 NOTE — Progress Notes (Addendum)
Subjective:    Patient ID: Janet Peters, female    DOB: Jul 10, 1950, 70 y.o.   MRN: [id number]  HPI  Pt in for some lt hip area pain with some lower back pain. Pain has been present for one week. She points to lower back/left sciatic area as source Mild pain seated. Sometimes notes pain more when steps, twist thorax or pends over. Occasional transient pain toward hip and left side groin area. No rash present.  LUMBAR SPINE - 2-3 VIEW  COMPARISON:  None.  FINDINGS: No fracture or spondylolisthesis is noted. Mild degenerative disc disease is noted at L1-2 with anterior osteophyte formation. Minimal anterior osteophyte formation is also noted at L2-3 and L3-4.  IMPRESSION: Mild degenerative changes as described above.  No acute abnormality seen in the lumbar spine.  Pt is taking advil for pain.   Pt has history of insomnia. All her life had insmonia. Still having some despite use of trazadone.  Pt has diabetes. Last a1c 6.9. on metformin 500 mg twice daily.  High cholesterol history. On lipitor 10 mg daily.   Review of Systems  Constitutional: Negative for chills, fatigue and fever.  Respiratory: Negative for cough, chest tightness, shortness of breath and wheezing.   Cardiovascular: Negative for palpitations.  Gastrointestinal: Negative for abdominal pain, blood in stool, constipation, diarrhea and nausea.  Genitourinary: Negative for dyspareunia, flank pain, frequency, genital sores, menstrual problem and urgency.  Musculoskeletal: Positive for back pain.  Skin: Positive for rash.       Rt lower pretibial rash on and off. She notices more in past when nervous.  Neurological: Negative for seizures, facial asymmetry and weakness.  Hematological: Negative for adenopathy. Does not bruise/bleed easily.  Psychiatric/Behavioral: Negative for behavioral problems and confusion.     Past Medical History:  Diagnosis Date  . Allergy    seasonal allergies  . Anxiety     situational   . Arthritis    bilateral knees  . Bradycardia    per pt report  . Depression    situational   . Diabetes mellitus without complication ([place])    on meds  . Hyperlipidemia    on meds  . Osteopenia 10/2016   T score -1.3 FRAX 3.5%/0.1%     Social History   Socioeconomic History  . Marital status: Married    Spouse name: Not on file  . Number of children: Not on file  . Years of education: Not on file  . Highest education level: Not on file  Occupational History  . Not on file  Tobacco Use  . Smoking status: Never Smoker  . Smokeless tobacco: Never Used  Vaping Use  . Vaping Use: Never used  Substance and Sexual Activity  . Alcohol use: No  . Drug use: No  . Sexual activity: Yes    Partners: Male    Comment: 1st intercourse- 88, partners - 2, married- 34 yrs  Other Topics Concern  . Not on file  Social History Narrative  . Not on file   Social Determinants of Health   Financial Resource Strain: Not on file  Food Insecurity: Not on file  Transportation Needs: Not on file  Physical Activity: Not on file  Stress: Not on file  Social Connections: Not on file  Intimate Partner Violence: Not on file    Past Surgical History:  Procedure Laterality Date  . CATARACT EXTRACTION Left   . CESAREAN SECTION     x2  . CHOLECYSTECTOMY, LAPAROSCOPIC  2010  . KNEE ARTHROSCOPY W/ MENISCAL REPAIR Left 2011  . PARTIAL HYSTERECTOMY  1988    Family History  Problem Relation Age of Onset  . Heart attack Maternal Grandmother   . Colon polyps Neg Hx   . Colon cancer Neg Hx   . Esophageal cancer Neg Hx   . Stomach cancer Neg Hx   . Rectal cancer Neg Hx     No Known Allergies  Current Outpatient Medications on File Prior to Visit  Medication Sig Dispense Refill  . Accu-Chek Softclix Lancets lancets TEST TWICE DAILY 200 each 0  . atorvastatin (LIPITOR) 10 MG tablet TAKE 1 TABLET BY MOUTH DAILY 90 tablet 1  . Blood Glucose Calibration (ACCU-CHEK COMPACT  PLUS CONTROL) SOLN 1 each by In Vitro route daily as needed. 1 each 1  . Cholecalciferol (VITAMIN D3) 5000 units CAPS Take by mouth.    . fluticasone (FLONASE) 50 MCG/ACT nasal spray SHAKE LIQUID AND USE 2 SPRAYS IN EACH NOSTRIL DAILY (Patient taking differently: daily as needed.) 48 g 1  . glucose blood (ACCU-CHEK AVIVA PLUS) test strip USE TWICE DAILY AS DIRECTED.  DX CODE: E11.9 200 each 1  . metFORMIN (GLUCOPHAGE) 500 MG tablet TAKE 1 TABLET(500 MG) BY MOUTH TWICE DAILY WITH A MEAL 180 tablet 3  . prednisoLONE acetate (PRED FORTE) 1 % ophthalmic suspension in the morning and at bedtime.    . traZODone (DESYREL) 50 MG tablet Take 0.5-1 tablets (25-50 mg total) by mouth at bedtime as needed for sleep. 30 tablet 3  . Vitamin D, Ergocalciferol, (DRISDOL) 1.25 MG (50000 UNIT) CAPS capsule TAKE 1 CAPSULE BY MOUTH EVERY 7 DAYS 12 capsule 0   No current facility-administered medications on file prior to visit.    BP 127/60   Pulse 91   Temp 98.3 F (36.8 C) (Oral)   Resp 18   Ht 5\' 3"  (1.6 m)   Wt 178 lb (80.7 kg)   SpO2 97%   BMI 31.53 kg/m       Objective:   Physical Exam  General Appearance- Not in acute distress.    Chest and Lung Exam Auscultation: Breath sounds:-Normal. Clear even and unlabored. Adventitious sounds:- No Adventitious sounds.  Cardiovascular Auscultation:Rythm - Regular, rate and rythm. Heart Sounds -Normal heart sounds.  Abdomen Inspection:-Inspection Normal.  Palpation/Perucssion: Palpation and Percussion of the abdomen reveal- Non Tender, No Rebound tenderness, No rigidity(Guarding) and No Palpable abdominal masses.  Liver:-Normal.  Spleen:- Normal.   Back No  lumbar spine tenderness to palpation. Pain on straight leg lift. Pain on lateral movements and flexion/extension of the spine. Bilateral si tendernenss. More left side.  Lower ext neurologic  L5-S1 sensation intact bilaterally. Normal patellar reflexes bilaterally. No foot drop  bilaterally.  Left hip- no pain on palpation.  Skin-right pretibial area faint pinkish rash.  Not warm to touch.  Mild dry appearance.    Assessment & Plan:  Your back pain is in sciatic area with more left side features. Recommend can increase dose of your advil. Check dose and can use up to 400 mg- 800 mg 3 times daily over next week. Will add low dose flexeril 5 mgl to use at night. If pain worsens or changes notify us. Could repeat lumbar xray to see if interval change since last xray.  For diabetes continue metformin 500 mg twice daily.  For high cholesterol continue atorvastatin 10 mg daily.  For insomnia not ideal controlled. continue trazadone but can add melatonin 5 mg dose at  night.   For rash left lower ext recommend moisturizer twice daily and use triamcinolone cream twice daily.  Mackie Pai, PA-C   Follow up in 2-3 weeks or as needed

## 2020-05-24 ENCOUNTER — Inpatient Hospital Stay (HOSPITAL_BASED_OUTPATIENT_CLINIC_OR_DEPARTMENT_OTHER): Admission: RE | Admit: 2020-05-24 | Payer: Medicare Other | Source: Ambulatory Visit

## 2020-06-21 ENCOUNTER — Ambulatory Visit (HOSPITAL_BASED_OUTPATIENT_CLINIC_OR_DEPARTMENT_OTHER)
Admission: RE | Admit: 2020-06-21 | Discharge: 2020-06-21 | Disposition: A | Discharge status: Home / Self Care | Payer: Medicare Other | Source: Ambulatory Visit | Attending: Medical | Admitting: Medical

## 2020-06-21 ENCOUNTER — Other Ambulatory Visit: Payer: Self-pay

## 2020-06-21 ENCOUNTER — Encounter (HOSPITAL_BASED_OUTPATIENT_CLINIC_OR_DEPARTMENT_OTHER): Payer: Self-pay

## 2020-06-21 DIAGNOSIS — Z1231 Encounter for screening mammogram for malignant neoplasm of breast: Secondary | ICD-10-CM | POA: Diagnosis not present

## 2020-07-16 ENCOUNTER — Encounter: Payer: Self-pay | Admitting: Medical

## 2020-07-16 ENCOUNTER — Ambulatory Visit (INDEPENDENT_AMBULATORY_CARE_PROVIDER_SITE_OTHER): Payer: Medicare Other | Admitting: Medical

## 2020-07-16 ENCOUNTER — Other Ambulatory Visit: Payer: Self-pay

## 2020-07-16 VITALS — BP 132/70 | HR 100 | Resp 20 | Ht 63.0 in | Wt 176.0 lb

## 2020-07-16 DIAGNOSIS — E088 Diabetes mellitus due to underlying condition with unspecified complications: Secondary | ICD-10-CM | POA: Diagnosis not present

## 2020-07-16 DIAGNOSIS — Z794 Long term (current) use of insulin: Secondary | ICD-10-CM

## 2020-07-16 DIAGNOSIS — R21 Rash and other nonspecific skin eruption: Secondary | ICD-10-CM | POA: Diagnosis not present

## 2020-07-16 DIAGNOSIS — E118 Type 2 diabetes mellitus with unspecified complications: Secondary | ICD-10-CM

## 2020-07-16 DIAGNOSIS — R002 Palpitations: Secondary | ICD-10-CM | POA: Diagnosis not present

## 2020-07-16 MED ORDER — ACCU-CHEK AVIVA PLUS VI STRP: ORAL_STRIP | 1 refills | Status: DC

## 2020-07-16 NOTE — Progress Notes (Signed)
Subjective:    Patient ID: Janet Peters, female    DOB: 1950/10/22, 70 y.o.   MRN: [id number]  HPI  Pt in with some rt lower ext rash present for about 6 months. She states does itch. Last visit I advised moisturizer and rx'd triamcinolone. Pt state for 2 weeks area improved but then came back. She also notes rash that has come and gone about once a year for various years. Also at one point had rash on back. Derm in [place] did bx. Told nothing dangerous but did not give diagnosis. Told only use topical benadryl.  History of htn. Pt bp mild elevated initially today.  Pt has cardiologsit appt in June. Pt has random sharp one second pain comes and goes Last time had random one second pain 2 days ago. Pt has no associated cardiac type signs or symptoms. No symptoms today. 2-3 cups of coffee a day. ekg done by Dr. Cherrie Gauze in the past when referred.    Review of Systems  Constitutional: Negative for chills and fatigue.  Respiratory: Negative for chest tightness, shortness of breath and wheezing.   Cardiovascular: Negative for chest pain and palpitations.  Gastrointestinal: Negative for abdominal pain.  Musculoskeletal: Negative for back pain.  Skin: Negative for rash.  Neurological: Negative for dizziness, numbness and headaches.  Hematological: Negative for adenopathy. Does not bruise/bleed easily.  Psychiatric/Behavioral: Negative for behavioral problems.    Past Medical History:  Diagnosis Date  . Allergy    seasonal allergies  . Anxiety    situational   . Arthritis    bilateral knees  . Bradycardia    per pt report  . Depression    situational   . Diabetes mellitus without complication ([place])    on meds  . Hyperlipidemia    on meds  . Osteopenia 10/2016   T score -1.3 FRAX 3.5%/0.1%     Social History   Socioeconomic History  . Marital status: Married    Spouse name: Not on file  . Number of children: Not on file  . Years of education: Not on file  .  Highest education level: Not on file  Occupational History  . Not on file  Tobacco Use  . Smoking status: Never Smoker  . Smokeless tobacco: Never Used  Vaping Use  . Vaping Use: Never used  Substance and Sexual Activity  . Alcohol use: No  . Drug use: No  . Sexual activity: Yes    Partners: Male    Comment: 1st intercourse- 52, partners - 2, married- 4 yrs  Other Topics Concern  . Not on file  Social History Narrative  . Not on file   Social Determinants of Health   Financial Resource Strain: Not on file  Food Insecurity: Not on file  Transportation Needs: Not on file  Physical Activity: Not on file  Stress: Not on file  Social Connections: Not on file  Intimate Partner Violence: Not on file    Past Surgical History:  Procedure Laterality Date  . CATARACT EXTRACTION Left   . CESAREAN SECTION     x2  . CHOLECYSTECTOMY, LAPAROSCOPIC  2010  . KNEE ARTHROSCOPY W/ MENISCAL REPAIR Left 2011  . PARTIAL HYSTERECTOMY  1988    Family History  Problem Relation Age of Onset  . Heart attack Maternal Grandmother   . Colon polyps Neg Hx   . Colon cancer Neg Hx   . Esophageal cancer Neg Hx   . Stomach cancer Neg Hx   .  Rectal cancer Neg Hx     No Known Allergies  Current Outpatient Medications on File Prior to Visit  Medication Sig Dispense Refill  . Accu-Chek Softclix Lancets lancets TEST TWICE DAILY 200 each 0  . atorvastatin (LIPITOR) 10 MG tablet TAKE 1 TABLET BY MOUTH DAILY 90 tablet 1  . Blood Glucose Calibration (ACCU-CHEK COMPACT PLUS CONTROL) SOLN 1 each by In Vitro route daily as needed. 1 each 1  . Cholecalciferol (VITAMIN D3) 5000 units CAPS Take by mouth.    . cyclobenzaprine (FLEXERIL) 5 MG tablet Take 1 tablet (5 mg total) by mouth at bedtime. 7 tablet 0  . fluticasone (FLONASE) 50 MCG/ACT nasal spray SHAKE LIQUID AND USE 2 SPRAYS IN EACH NOSTRIL DAILY (Patient taking differently: daily as needed.) 48 g 1  . glucose blood (ACCU-CHEK AVIVA PLUS) test strip  USE TWICE DAILY AS DIRECTED.  DX CODE: E11.9 200 each 1  . metFORMIN (GLUCOPHAGE) 500 MG tablet TAKE 1 TABLET(500 MG) BY MOUTH TWICE DAILY WITH A MEAL 180 tablet 3  . prednisoLONE acetate (PRED FORTE) 1 % ophthalmic suspension in the morning and at bedtime.    . traZODone (DESYREL) 50 MG tablet Take 0.5-1 tablets (25-50 mg total) by mouth at bedtime as needed for sleep. 30 tablet 3  . triamcinolone (KENALOG) 0.1 % Apply 1 application topically 2 (two) times daily. 30 g 1  . Vitamin D, Ergocalciferol, (DRISDOL) 1.25 MG (50000 UNIT) CAPS capsule TAKE 1 CAPSULE BY MOUTH EVERY 7 DAYS 12 capsule 0   No current facility-administered medications on file prior to visit.    BP 140/64   Pulse 100   Resp 20   Ht 5\' 3"  (1.6 m)   Wt 176 lb (79.8 kg)   SpO2 99%   BMI 31.18 kg/m       Objective:   Physical Exam  General Mental Status- Alert. General Appearance- Not in acute distress.   Skin General: Color- Normal Color. Moisture- Normal Moisture.  Neck Carotid Arteries- Normal color. Moisture- Normal Moisture. No carotid bruits. No JVD.  Chest and Lung Exam Auscultation: Breath Sounds:-Normal.  Cardiovascular Auscultation:Rythm- Regular. Murmurs & Other Heart Sounds:Auscultation of the heart reveals- No Murmurs.  Abdomen Inspection:-Inspeection Normal. Palpation/Percussion:Note:No mass. Palpation and Percussion of the abdomen reveal- Non Tender, Non Distended + BS, no rebound or guarding.   Neurologic Cranial Nerve exam:- CN III-XII intact(No nystagmus), symmetric smile. Strength:- 5/5 equal and symmetric strength both upper and lower extremities.  Rt lower ext- no swelling of calfs. Slight bumpy appearance to skin. Some hyperpigmentation. No redness. No warmth.       Assessment & Plan:  For skin rash persistent rt lower ext will refer to dermatologist. Holding off on treatment presently since don't want to change appearance. But if area worsens or changes during interim let  me know.  Your may have brief random palpitation. No current symptoms. Dr. Cherrie Gauze did ekg in past. Your heart sounds normal presently on exam. Recommend stop caffeine beverage. Placed referral asking if cardilogist can see you within 3 weeks. You may benefit from holter monitor. If any recurrent symptoms that persist then recommend try to be seen same day here, uc or ED.    Follow up week of September 04, 2020 or sooner if needed  [employer], PA-C   Time spent with patient today was 31  minutes which consisted of chart review, discussing diagnoeis, referrals and documentation.

## 2020-07-16 NOTE — Patient Instructions (Addendum)
For skin rash persistent rt lower ext will refer to dermatologist. Holding off on treatment presently since don't want to change appearance. But if area worsens or changes during interim let me know.  Your may have brief random palpitation. No current symptoms. Dr. Cherrie Gauze did ekg in past. Your heart sounds normal presently on exam. Recommend stop caffeine beverage. Placed referral asking if cardilogist can see you within 3 weeks. You may benefit from holter monitor. If any recurrent symptoms that persist then recommend try to be seen same day here, uc or ED.  Diabetes history. BP good on recheck. If bp increases would give ace or Arb. Continue metfomin and recheck a1c on follow up.  Follow up week of September 04, 2020 or sooner if needed

## 2020-07-21 ENCOUNTER — Other Ambulatory Visit: Payer: Self-pay

## 2020-07-21 DIAGNOSIS — E785 Hyperlipidemia, unspecified: Secondary | ICD-10-CM | POA: Insufficient documentation

## 2020-07-21 DIAGNOSIS — F419 Anxiety disorder, unspecified: Secondary | ICD-10-CM | POA: Insufficient documentation

## 2020-07-21 DIAGNOSIS — E119 Type 2 diabetes mellitus without complications: Secondary | ICD-10-CM | POA: Insufficient documentation

## 2020-07-21 DIAGNOSIS — F32A Depression, unspecified: Secondary | ICD-10-CM | POA: Insufficient documentation

## 2020-07-21 DIAGNOSIS — M1990 Unspecified osteoarthritis, unspecified site: Secondary | ICD-10-CM | POA: Insufficient documentation

## 2020-07-21 DIAGNOSIS — T7840XA Allergy, unspecified, initial encounter: Secondary | ICD-10-CM | POA: Insufficient documentation

## 2020-07-21 DIAGNOSIS — R001 Bradycardia, unspecified: Secondary | ICD-10-CM | POA: Insufficient documentation

## 2020-07-29 ENCOUNTER — Ambulatory Visit: Payer: Medicare Other | Admitting: Cardiology

## 2020-07-30 ENCOUNTER — Encounter: Payer: Self-pay | Admitting: Medical

## 2020-07-30 ENCOUNTER — Telehealth: Payer: Self-pay | Admitting: Medical

## 2020-07-30 NOTE — Telephone Encounter (Signed)
Pt sent message by mychart. Put in dermatology referral and her appointment is in August. Can we can her in somewhere sooner such as within a month or even sooner if possible.

## 2020-08-02 NOTE — Telephone Encounter (Signed)
Called pt and advised. -Jma

## 2020-08-03 ENCOUNTER — Telehealth: Payer: Self-pay | Admitting: Medical

## 2020-08-03 NOTE — Telephone Encounter (Signed)
I placed referral to derm and pt wanting to be seen soon. She is wiling to travel. Can you Try [facility] dermatology. [phone].  Can you use referral already placed.

## 2020-08-03 NOTE — Telephone Encounter (Signed)
Let pt know that I am asking referral staff to try [place].

## 2020-08-03 NOTE — Telephone Encounter (Signed)
Patient reports she is already on a cancellation list.  She don't mind travelling to another city she mentioned Janet Peters or Janet Peters.

## 2020-08-04 NOTE — Telephone Encounter (Signed)
Patient advised we will try to get her a sooner appointment in [place]. (this conversation was in [place])

## 2020-08-12 ENCOUNTER — Telehealth: Payer: Self-pay | Admitting: Medical

## 2020-08-12 ENCOUNTER — Encounter: Payer: Self-pay | Admitting: Medical

## 2020-08-12 NOTE — Telephone Encounter (Signed)
Will refer pt to [facility] dermatologist. The derm you told me has wait of 2 week wait time  approximate.  Pt has been waiting a month and no response from [place].

## 2020-08-26 ENCOUNTER — Other Ambulatory Visit: Payer: Self-pay

## 2020-08-26 ENCOUNTER — Ambulatory Visit (HOSPITAL_BASED_OUTPATIENT_CLINIC_OR_DEPARTMENT_OTHER)
Admission: RE | Admit: 2020-08-26 | Discharge: 2020-08-26 | Disposition: A | Discharge status: Home / Self Care | Payer: Medicare Other | Source: Ambulatory Visit | Attending: Cardiology | Admitting: Cardiology

## 2020-08-26 ENCOUNTER — Ambulatory Visit: Payer: Medicare Other | Admitting: Cardiology

## 2020-08-26 ENCOUNTER — Encounter: Payer: Self-pay | Admitting: Cardiology

## 2020-08-26 VITALS — BP 132/78 | HR 107 | Ht 62.0 in | Wt 174.1 lb

## 2020-08-26 DIAGNOSIS — I272 Pulmonary hypertension, unspecified: Secondary | ICD-10-CM | POA: Diagnosis not present

## 2020-08-26 DIAGNOSIS — E088 Diabetes mellitus due to underlying condition with unspecified complications: Secondary | ICD-10-CM | POA: Diagnosis not present

## 2020-08-26 DIAGNOSIS — E782 Mixed hyperlipidemia: Secondary | ICD-10-CM

## 2020-08-26 NOTE — Patient Instructions (Addendum)
Medication Instructions:  No medication changes. *If you need a refill on your cardiac medications before your next appointment, please call your pharmacy*   Lab Work: None ordered If you have labs (blood work) drawn today and your tests are completely normal, you will receive your results only by: Marland Kitchen MyChart Message (if you have MyChart) OR . A paper copy in the mail If you have any lab test that is abnormal or we need to change your treatment, we will call you to review the results.   Testing/Procedures:  We will order CT coronary calcium score. It will cost $99.00 and is not covered by insurance.   Please call [phone] to schedule at [address], [place] [address]  Or  [facility] [address], [place] [address] [phone] to schedule   Follow-Up: At [facility], you and your health needs are our priority.  As part of our continuing mission to provide you with exceptional heart care, we have created designated Provider Care Teams.  These Care Teams include your primary Cardiologist (physician) and Advanced Practice Providers (APPs -  Physician Assistants and Nurse Practitioners) who all work together to provide you with the care you need, when you need it.  We recommend signing up for the patient portal called "MyChart".  Sign up information is provided on this After Visit Summary.  MyChart is used to connect with patients for Virtual Visits (Telemedicine).  Patients are able to view lab/test results, encounter notes, upcoming appointments, etc.  Non-urgent messages can be sent to your provider as well.   To learn more about what you can do with MyChart, go to [email or web address].    Your next appointment:   6 month(s)  The format for your next appointment:   In Person  Provider:   Jyl Heinz, MD   Other Instructions  Janet Peters Coronary Calcium Scan Dannielle Karvonen de calcio  Peters es un estudio de diagnstico por imgenes usado para Hydrographic surveyor depsitos de placa en el revestimiento interno de los vasos sanguneos del corazn (arterias coronarias). La placa est compuesta de calcio, protenas y sustancias grasas. Estos depsitos de placa pueden bloquear parcialmente y Psychiatric nurse las arterias coronarias sin producir sntomas ni seales de advertencia. Esto aumenta el riesgo de una persona de sufrir un infarto de miocardio. Este estudio se recomienda para las personas que tienen un riesgo moderado de sufrir enfermedad cardaca. El estudio puede detectar depsitos de placa antes de que se presenten sntomas. Informe al mdico acerca de lo siguiente:  Cualquier alergia que tenga.  Todos los [employer], incluidos vitaminas, hierbas, gotas oftlmicas, cremas y medicamentos de venta libre.  Cualquier problema previo que usted o algn miembro de su familia hayan tenido con los anestsicos.  Cualquier trastorno de la sangre que tenga.  Cirugas a las que se haya sometido.  Cualquier afeccin mdica que tenga.  Si est embarazada o podra estarlo. Cules son los riesgos? En general, se trata de un procedimiento seguro. Sin embargo, pueden ocurrir complicaciones, por ejemplo:  Dao a una mujer embarazada y su beb en gestacin. En este examen se utiliza radiacin. La exposicin a la radiacin puede ser peligrosa para Dalene Seltzer embarazada y su beb en gestacin. Si est embarazada o piensa que puede estar [place], no se debe Best boy.  Leve aumento en el riesgo de cncer. Esto se debe a la radiacin que se Chief Technology Officer. Qu ocurre antes  del procedimiento? Consulte al mdico si hay instrucciones especficas para prepararse para este procedimiento. Es posible que le indiquen que evite productos que contengan cafena, tabaco o nicotina durante 4horas antes del procedimiento. Qu ocurre durante el procedimiento?  Deber desvestirse  y [employer] las joyas que tenga en el cuello o el [place].  Usar Malka So de hospital.  Marin Comment colocarn electrodos [employer] en el pecho. Los electrodos se conectarn a una mquina de electrocardiograma (ECG) para Museum/gallery curator un trazado de la actividad elctrica del corazn.  Deber Mady Haagensen en una camilla curva que est conectada al tomgrafo.  Es posible que le administren medicamentos para disminuir la frecuencia cardaca, para que puedan crearse imgenes claras.  Lo llevarn al tomgrafo y el tomgrafo le tomar imgenes del corazn. [employer], se le pedir que se quede quieto y Administrator la respiracin durante 2a 3segundos por vez mientras se toma cada imagen del corazn. El procedimiento puede variar segn el mdico y el hospital.   Sander Nephew ocurre despus del procedimiento?  Puede vestirse.  Puede retomar sus actividades habituales.  Es su responsabilidad retirar [employer] del procedimiento. Pregntele a su mdico, o a un miembro del personal del departamento donde se realice el procedimiento, cundo estarn [employer]. Resumen  Una gammagrafa de calcio Peters es un estudio de diagnstico por imgenes usado para Hydrographic surveyor depsitos de placa en el revestimiento interno de los vasos sanguneos del corazn (arterias coronarias). La placa est compuesta de calcio, protenas y sustancias grasas.  En general, se trata de un procedimiento seguro. Informe al mdico si est embarazada o puede estarlo.  Consulte al mdico si hay instrucciones especficas para prepararse para este procedimiento.  Un tomgrafo toma imgenes del corazn.  Puede retomar sus actividades normales despus de que se realiza [place]. Esta informacin no tiene Marine scientist el consejo del mdico. Asegrese de hacerle al mdico cualquier pregunta que tenga. Document Revised: 11/27/2018 Document Reviewed: 11/27/2018 Elsevier Patient Education  2021 [employer].

## 2020-08-26 NOTE — Progress Notes (Signed)
Cardiology Office Note:    Date:  08/26/2020   ID:  Janet Peters, DOB 28-Jun-1950, MRN [id number]  PCP:  Mackie Pai, PA-C  Cardiologist:  Jenean Lindau, MD   Referring MD: Mackie Pai, PA-C    ASSESSMENT:    1. Mixed hyperlipidemia   2. Diabetes mellitus due to underlying condition with unspecified complications ([place])   3. Mixed dyslipidemia    PLAN:    In order of problems listed above:  1. Primary prevention stressed with the patient.  Importance of compliance with diet and medication stressed.  She vocalized understanding she exercises on a regular basis and is happy about it 2. Essential hypertension: Blood pressure is stable and diet was emphasized 3. Mixed dyslipidemia: I discussed lipids at length.  Lifestyle modification was emphasized.  Exercise was emphasized.  She promises to do better.  I would like to know how aggressive I need to review her lipids and therefore a CT calcium scoring and she is agreeable. 4. Obesity: Weight reduction stressed.  Diet was emphasized.  She promises to do better 5. Patient will be seen in follow-up appointment in 6 months or earlier if the patient has any concerns    Medication Adjustments/Labs and Tests Ordered: Current medicines are reviewed at length with the patient today.  Concerns regarding medicines are outlined above.  No orders of the defined types were placed in this encounter.  No orders of the defined types were placed in this encounter.    No chief complaint on file.    History of Present Illness:    Janet Peters is a 70 y.o. female.  Patient has past medical history of essential hypertension dyslipidemia and diabetes mellitus.  She denies any problems at this time and takes care of activities of daily living.  She walks on a regular basis and has walked about an hour without any symptoms.  At the time of my evaluation, the patient is alert awake oriented and in no distress.  Past Medical  History:  Diagnosis Date  . Allergy    seasonal allergies  . Anxiety    situational   . Arthritis    bilateral knees  . Bradycardia    per pt report  . Chest discomfort 11/19/2019  . Depression    situational   . Diabetes mellitus due to underlying condition with unspecified complications ([place]) [id number]  . Diabetes mellitus without complication ([place])    on meds  . Hyperlipidemia    on meds  . Mixed dyslipidemia 01/01/2018  . Osteopenia 10/2016   T score -1.3 FRAX 3.5%/0.1%    Past Surgical History:  Procedure Laterality Date  . CATARACT EXTRACTION Left   . CESAREAN SECTION     x2  . CHOLECYSTECTOMY, LAPAROSCOPIC  2010  . KNEE ARTHROSCOPY W/ MENISCAL REPAIR Left 2011  . PARTIAL HYSTERECTOMY  1988    Current Medications: No outpatient medications have been marked as taking for the 08/26/20 encounter (Office Visit) with [facility], Reita Cliche, MD.     Allergies:   Patient has no known allergies.   Social History   Socioeconomic History  . Marital status: Married    Spouse name: Not on file  . Number of children: Not on file  . Years of education: Not on file  . Highest education level: Not on file  Occupational History  . Not on file  Tobacco Use  . Smoking status: Never Smoker  . Smokeless tobacco: Never Used  Vaping Use  .  Vaping Use: Never used  Substance and Sexual Activity  . Alcohol use: No  . Drug use: No  . Sexual activity: Yes    Partners: Male    Comment: 1st intercourse- 65, partners - 2, married- 2 yrs  Other Topics Concern  . Not on file  Social History Narrative  . Not on file   Social Determinants of Health   Financial Resource Strain: Not on file  Food Insecurity: Not on file  Transportation Needs: Not on file  Physical Activity: Not on file  Stress: Not on file  Social Connections: Not on file     Family History: The patient's family history includes Heart attack in her maternal grandmother. There is no history of Colon polyps, Colon  cancer, Esophageal cancer, Stomach cancer, or Rectal cancer.  ROS:   Please see the history of present illness.    All other systems reviewed and are negative.  EKGs/Labs/Other Studies Reviewed:    The following studies were reviewed today: 's   Recent Labs: 11/17/2019: TSH 3.92 12/18/2019: Hemoglobin 12.9; Platelets 337 04/27/2020: ALT 20; BUN 13; Creatinine, Ser 0.60; Potassium 4.2; Sodium 139  Recent Lipid Panel    Component Value Date/Time   CHOL 174 04/27/2020 0845   CHOL 174 05/28/2019 0926   TRIG 220.0 (H) 04/27/2020 0845   HDL 51.00 04/27/2020 0845   HDL 53 05/28/2019 0926   CHOLHDL 3 04/27/2020 0845   VLDL 44.0 (H) 04/27/2020 0845   LDLCALC 84 05/28/2019 0926   LDLDIRECT 96.0 04/27/2020 0845    Physical Exam:    VS:  BP 132/78   Pulse (!) 107   Ht 5\' 2"  (1.575 m)   Wt 174 lb 1.9 oz (79 kg)   SpO2 98%   BMI 31.85 kg/m     Wt Readings from Last 3 Encounters:  08/26/20 174 lb 1.9 oz (79 kg)  07/16/20 176 lb (79.8 kg)  05/18/20 178 lb (80.7 kg)     GEN: Patient is in no acute distress HEENT: Normal NECK: No JVD; No carotid bruits LYMPHATICS: No lymphadenopathy CARDIAC: Hear sounds regular, 2/6 systolic murmur at the apex. RESPIRATORY:  Clear to auscultation without rales, wheezing or rhonchi  ABDOMEN: Soft, non-tender, non-distended MUSCULOSKELETAL:  No edema; No deformity  SKIN: Warm and dry NEUROLOGIC:  Alert and oriented x 3 PSYCHIATRIC:  Normal affect   Signed, Jenean Lindau, MD  08/26/2020 1:49 PM    [facility]

## 2020-08-27 ENCOUNTER — Encounter: Payer: Self-pay | Admitting: Medical

## 2020-08-27 NOTE — Addendum Note (Signed)
Addended by: Truddie Hidden on: 08/27/2020 02:23 PM   Modules accepted: Orders

## 2020-08-31 ENCOUNTER — Telehealth: Payer: Self-pay | Admitting: Medical

## 2020-08-31 NOTE — Telephone Encounter (Signed)
Patient states his wife was suppose to get a referral to [facility]. She has an appt for Friday so patient is requeting that referral be sent before then.   ar

## 2020-08-31 NOTE — Telephone Encounter (Signed)
Referral placed to [facility] on 08/16/20 , referral refaxed by front desk staff on 08/31/2020

## 2020-09-06 ENCOUNTER — Ambulatory Visit: Payer: Medicare Other | Admitting: Medical

## 2020-09-09 ENCOUNTER — Other Ambulatory Visit: Payer: Self-pay

## 2020-09-10 ENCOUNTER — Ambulatory Visit (INDEPENDENT_AMBULATORY_CARE_PROVIDER_SITE_OTHER): Payer: Medicare Other | Admitting: Medical

## 2020-09-10 VITALS — BP 128/65 | HR 86 | Temp 98.2°F | Resp 18 | Ht 62.0 in | Wt 172.0 lb

## 2020-09-10 DIAGNOSIS — F32A Depression, unspecified: Secondary | ICD-10-CM | POA: Diagnosis not present

## 2020-09-10 DIAGNOSIS — Z23 Encounter for immunization: Secondary | ICD-10-CM

## 2020-09-10 DIAGNOSIS — Z7185 Encounter for immunization safety counseling: Secondary | ICD-10-CM

## 2020-09-10 DIAGNOSIS — R7989 Other specified abnormal findings of blood chemistry: Secondary | ICD-10-CM | POA: Diagnosis not present

## 2020-09-10 DIAGNOSIS — E118 Type 2 diabetes mellitus with unspecified complications: Secondary | ICD-10-CM | POA: Diagnosis not present

## 2020-09-10 DIAGNOSIS — Z794 Long term (current) use of insulin: Secondary | ICD-10-CM

## 2020-09-10 DIAGNOSIS — S46811A Strain of other muscles, fascia and tendons at shoulder and upper arm level, right arm, initial encounter: Secondary | ICD-10-CM

## 2020-09-10 DIAGNOSIS — E782 Mixed hyperlipidemia: Secondary | ICD-10-CM

## 2020-09-10 DIAGNOSIS — G4700 Insomnia, unspecified: Secondary | ICD-10-CM

## 2020-09-10 LAB — COMPREHENSIVE METABOLIC PANEL
ALT: 19 U/L (ref 0–35)
AST: 17 U/L (ref 0–37)
Albumin: 4.6 g/dL (ref 3.5–5.2)
Alkaline Phosphatase: 110 U/L (ref 39–117)
BUN: 14 mg/dL (ref 6–23)
CO2: 25 mEq/L (ref 19–32)
Calcium: 9.9 mg/dL (ref 8.4–10.5)
Chloride: 104 mEq/L (ref 96–112)
Creatinine, Ser: 0.7 mg/dL (ref 0.40–1.20)
GFR: 87.83 mL/min (ref >60.00)
Glucose, Bld: 113 mg/dL — ABNORMAL HIGH (ref 70–99)
Potassium: 4 mEq/L (ref 3.5–5.1)
Sodium: 140 mEq/L (ref 135–145)
Total Bilirubin: 0.5 mg/dL (ref 0.2–1.2)
Total Protein: 7.7 g/dL (ref 6.0–8.3)

## 2020-09-10 LAB — LIPID PANEL
Cholesterol: 171 mg/dL (ref 0–200)
HDL: 57.9 mg/dL (ref >39.00)
LDL Cholesterol: 76 mg/dL (ref 0–99)
NonHDL: 112.95
Total CHOL/HDL Ratio: 3
Triglycerides: 187 mg/dL — ABNORMAL HIGH (ref 0.0–149.0)
VLDL: 37.4 mg/dL (ref 0.0–40.0)

## 2020-09-10 LAB — VITAMIN D 25 HYDROXY (VIT D DEFICIENCY, FRACTURES): VITD: 31.81 ng/mL (ref 30.00–100.00)

## 2020-09-10 LAB — HEMOGLOBIN A1C: Hgb A1c MFr Bld: 7 % — ABNORMAL HIGH (ref 4.6–6.5)

## 2020-09-10 MED ORDER — CYCLOBENZAPRINE HCL 5 MG PO TABS: ORAL_TABLET | ORAL | 0 refills | Status: DC

## 2020-09-10 NOTE — Addendum Note (Signed)
Addended by: Jeronimo Greaves on: 09/10/2020 08:49 AM   Modules accepted: Orders

## 2020-09-10 NOTE — Progress Notes (Signed)
Subjective:    Patient ID: Janet Peters, female    DOB: November 25, 1950, 70 y.o.   MRN: [id number]  HPI  Pt depression is improved. Pt no longer needs effexor. Pt feels like coming to terms with mom death one year ago. Pt daughter will be immigrating at end of this month. So mood better.   Has insomnia. Pt failed  trazadone. Pt has not tried melatonin.   Does report some recent neck pain for months. Hurts when she moves. Pain low. She points to trapezius. She think maybe muscle relaxant would help.   Pt has high cholesterol. She is on atorvastatin.   Pt is diabetic and on metformin. Last a1c was 6.7.   Low vit D at 30.    Pt is fasting today.     Review of Systems  Constitutional:  Negative for chills, fatigue and fever.  HENT:  Negative for congestion, drooling, ear pain and postnasal drip.   Respiratory:  Negative for cough, chest tightness, shortness of breath and wheezing.   Cardiovascular:  Negative for chest pain and palpitations.  Gastrointestinal:  Negative for abdominal pain, constipation, diarrhea and rectal pain.  Genitourinary:  Negative for dysuria, flank pain and frequency.  Musculoskeletal:  Negative for back pain.  Skin:  Negative for rash.    Past Medical History:  Diagnosis Date   Allergy    seasonal allergies   Anxiety    situational    Arthritis    bilateral knees   Bradycardia    per pt report   Chest discomfort 11/19/2019   Depression    situational    Diabetes mellitus due to underlying condition with unspecified complications ([place]) 76/09/3417   Diabetes mellitus without complication ([place])    on meds   Hyperlipidemia    on meds   Mixed dyslipidemia 01/01/2018   Osteopenia 10/2016   T score -1.3 FRAX 3.5%/0.1%     Social History   Socioeconomic History   Marital status: Married    Spouse name: Not on file   Number of children: Not on file   Years of education: Not on file   Highest education level: Not on file  Occupational  History   Not on file  Tobacco Use   Smoking status: Never   Smokeless tobacco: Never  Vaping Use   Vaping Use: Never used  Substance and Sexual Activity   Alcohol use: No   Drug use: No   Sexual activity: Yes    Partners: Male    Comment: 1st intercourse- 43, partners - 2, married- 63 yrs  Other Topics Concern   Not on file  Social History Narrative   Not on file   Social Determinants of Health   Financial Resource Strain: Not on file  Food Insecurity: Not on file  Transportation Needs: Not on file  Physical Activity: Not on file  Stress: Not on file  Social Connections: Not on file  Intimate Partner Violence: Not on file    Past Surgical History:  Procedure Laterality Date   CATARACT EXTRACTION Left    CESAREAN SECTION     x2   CHOLECYSTECTOMY, LAPAROSCOPIC  2010   KNEE ARTHROSCOPY W/ MENISCAL REPAIR Left 2011   PARTIAL HYSTERECTOMY  1988    Family History  Problem Relation Age of Onset   Heart attack Maternal Grandmother    Colon polyps Neg Hx    Colon cancer Neg Hx    Esophageal cancer Neg Hx    Stomach cancer Neg  Hx    Rectal cancer Neg Hx     No Known Allergies  Current Outpatient Medications on File Prior to Visit  Medication Sig Dispense Refill   atorvastatin (LIPITOR) 10 MG tablet Take 10 mg by mouth daily.     Cholecalciferol (VITAMIN D3) 5000 units CAPS Take 5,000 Units by mouth daily.     fluticasone (FLONASE) 50 MCG/ACT nasal spray Place 2 sprays into both nostrils daily.     metFORMIN (GLUCOPHAGE) 500 MG tablet Take 500 mg by mouth 2 (two) times daily with a meal.     No current facility-administered medications on file prior to visit.    BP 128/65   Pulse 86   Temp 98.2 F (36.8 C)   Resp 18   Ht 5\' 2"  (1.575 m)   Wt 172 lb (78 kg)   SpO2 98%   BMI 31.46 kg/m       Objective:   Physical Exam   General Mental Status- Alert. General Appearance- Not in acute distress.   Skin General: Color- Normal Color. Moisture- Normal  Moisture.  Neck Carotid Arteries- Normal color. Moisture- Normal Moisture. No carotid bruits. No JVD.  Chest and Lung Exam Auscultation: Breath Sounds:-Normal.  Cardiovascular Auscultation:Rythm- Regular. Murmurs & Other Heart Sounds:Auscultation of the heart reveals- No Murmurs.  Abdomen Inspection:-Inspeection Normal. Palpation/Percussion:Note:No mass. Palpation and Percussion of the abdomen reveal- Non Tender, Non Distended + BS, no rebound or guarding.   Neurologic Cranial Nerve exam:- CN III-XII intact(No nystagmus), symmetric smile. Strength:- 5/5 equal and symmetric strength both upper and lower extremities.      Assessment & Plan:   For diabetes recheck a1c. Continue metformin and exercise. Dose change or additional med possible after lab review.  Recheck lipid panel. Continue lipitor.  For insomnia try melatonin over the counter.  Glad to hear depression/mood resolved.  Recheck vit D for low vit D.  For trapezius strain rx flexeril. Rx advisement. Can use in combination with ibuprofen.   Follow up in 3 months or as needed  [employer], [employer]

## 2020-09-10 NOTE — Patient Instructions (Addendum)
For diabetes recheck a1c. Continue metformin and exercise. Dose change or additional med possible after lab review.  Recheck lipid panel. Continue lipitor.  For insomnia try melatonin over the counter.  Glad to hear depression/mood resolved.  Recheck vit D for low vit D.  For trapezius strain rx flexeril. Rx advisement. Can use in combination with ibuprofen.   Ppsv 23 today.  Follow up in 3 months or as needed

## 2020-09-12 ENCOUNTER — Other Ambulatory Visit: Payer: Self-pay | Admitting: Medical

## 2020-09-12 MED ORDER — VITAMIN D (ERGOCALCIFEROL) 1.25 MG (50000 UNIT) PO CAPS: 50000.0000 [IU] | ORAL_CAPSULE | ORAL | 0 refills | Status: DC

## 2020-09-12 MED ORDER — ATORVASTATIN CALCIUM 10 MG PO TABS: 10.0000 mg | ORAL_TABLET | Freq: Every day | ORAL | 3 refills | Status: DC

## 2020-09-12 MED ORDER — METFORMIN HCL 500 MG PO TABS: 500.0000 mg | ORAL_TABLET | Freq: Two times a day (BID) | ORAL | 3 refills | Status: DC

## 2020-09-12 NOTE — Addendum Note (Signed)
Addended by: Anabel Halon on: 09/12/2020 10:46 AM   Modules accepted: Orders

## 2020-09-13 MED ORDER — VITAMIN D (ERGOCALCIFEROL) 1.25 MG (50000 UNIT) PO CAPS: 50000.0000 [IU] | ORAL_CAPSULE | ORAL | 0 refills | Status: DC

## 2020-09-13 NOTE — Progress Notes (Signed)
Patient advised of results, provider's advised and new prescription.  She reports she is taking 5000 units of vitamin D daily. (This conversation was in [place])

## 2020-09-13 NOTE — Addendum Note (Signed)
Addended by: Anabel Halon on: 09/13/2020 06:50 PM   Modules accepted: Orders

## 2020-09-16 ENCOUNTER — Ambulatory Visit: Payer: Medicare Other | Admitting: Cardiology

## 2020-09-16 ENCOUNTER — Other Ambulatory Visit: Payer: Self-pay

## 2020-09-16 DIAGNOSIS — R7989 Other specified abnormal findings of blood chemistry: Secondary | ICD-10-CM

## 2020-09-21 ENCOUNTER — Other Ambulatory Visit: Payer: Self-pay

## 2020-09-21 ENCOUNTER — Ambulatory Visit (INDEPENDENT_AMBULATORY_CARE_PROVIDER_SITE_OTHER): Payer: Medicare Other

## 2020-09-21 DIAGNOSIS — I272 Pulmonary hypertension, unspecified: Secondary | ICD-10-CM

## 2020-09-21 LAB — ECHOCARDIOGRAM COMPLETE
Area-P 1/2: 6.9 cm2
S' Lateral: 2.6 cm

## 2020-09-21 NOTE — Progress Notes (Signed)
Complete echocardiogram performed. Contrast needed but not used due to poor IV access.  Jimmy Ladarrion Telfair RDCS, RVT

## 2020-11-05 ENCOUNTER — Other Ambulatory Visit: Payer: Self-pay

## 2020-11-05 ENCOUNTER — Ambulatory Visit (INDEPENDENT_AMBULATORY_CARE_PROVIDER_SITE_OTHER): Payer: Medicare Other | Admitting: Obstetrics & Gynecology

## 2020-11-05 ENCOUNTER — Encounter: Payer: Self-pay | Admitting: Obstetrics & Gynecology

## 2020-11-05 VITALS — BP 134/90 | HR 93 | Resp 16 | Ht 59.75 in | Wt 175.0 lb

## 2020-11-05 DIAGNOSIS — E66811 Obesity, class 1: Secondary | ICD-10-CM

## 2020-11-05 DIAGNOSIS — Z6834 Body mass index (BMI) 34.0-34.9, adult: Secondary | ICD-10-CM

## 2020-11-05 DIAGNOSIS — M8588 Other specified disorders of bone density and structure, other site: Secondary | ICD-10-CM

## 2020-11-05 DIAGNOSIS — Z780 Asymptomatic menopausal state: Secondary | ICD-10-CM | POA: Diagnosis not present

## 2020-11-05 DIAGNOSIS — Z90710 Acquired absence of both cervix and uterus: Secondary | ICD-10-CM | POA: Diagnosis not present

## 2020-11-05 DIAGNOSIS — E6609 Other obesity due to excess calories: Secondary | ICD-10-CM

## 2020-11-05 DIAGNOSIS — Z01419 Encounter for gynecological examination (general) (routine) without abnormal findings: Secondary | ICD-10-CM

## 2020-11-05 NOTE — Progress Notes (Signed)
Janet Peters Feb 21, 1951 [id number]   History:    70 y.o. Janet Peters Married.  From [place], mother passed away from [place] in Jun 16, 2019.  Daughter moved here from [place] 36 yo.  Son in [place].   RP:  Established patient presenting for annual gyn exam    HPI: S/P Total Hysterectomy.  Postmenopause, well on no HRT.  No pelvic pain.  No pain with IC.  Pap Neg in 06-15-17.  Urine/BMs normal.  Breasts normal.  Mammo Neg 06-15-20.  BMI 34.46.  Walking regularly.  Health labs with Fam MD.  BD 11/2018 Very mild Osteopenia with T-Score -1.1 at the AP Spine.  All other sites normal.  [place] 03/2020.   Past medical history,surgical history, family history and social history were all reviewed and documented in the EPIC chart.  Gynecologic History No LMP recorded. Patient has had a hysterectomy.  Obstetric History OB History  Gravida Para Term Preterm AB Living  '2 2       2  '$ SAB IAB Ectopic Multiple Live Births               # Outcome Date GA Lbr Len/2nd Weight Sex Delivery Anes PTL Lv  2 Para           1 Para              ROS: A ROS was performed and pertinent positives and negatives are included in the history.  GENERAL: No fevers or chills. HEENT: No change in vision, no earache, sore throat or sinus congestion. NECK: No pain or stiffness. CARDIOVASCULAR: No chest pain or pressure. No palpitations. PULMONARY: No shortness of breath, cough or wheeze. GASTROINTESTINAL: No abdominal pain, nausea, vomiting or diarrhea, melena or bright red blood per rectum. GENITOURINARY: No urinary frequency, urgency, hesitancy or dysuria. MUSCULOSKELETAL: No joint or muscle pain, no back pain, no recent trauma. DERMATOLOGIC: No rash, no itching, no lesions. ENDOCRINE: No polyuria, polydipsia, no heat or cold intolerance. No recent change in weight. HEMATOLOGICAL: No anemia or easy bruising or bleeding. NEUROLOGIC: No headache, seizures, numbness, tingling or weakness. PSYCHIATRIC: No depression, no loss of interest  in normal activity or change in sleep pattern.     Exam:   BP 134/90   Pulse 93   Resp 16   Ht 4' 11.75" (1.518 m)   Wt 175 lb (79.4 kg)   BMI 34.46 kg/m   Body mass index is 34.46 kg/m.  General appearance : Well developed well nourished female. No acute distress HEENT: Eyes: no retinal hemorrhage or exudates,  Neck supple, trachea midline, no carotid bruits, no thyroidmegaly Lungs: Clear to auscultation, no rhonchi or wheezes, or rib retractions  Heart: Regular rate and rhythm, no murmurs or gallops Breast:Examined in sitting and supine position were symmetrical in appearance, no palpable masses or tenderness,  no skin retraction, no nipple inversion, no nipple discharge, no skin discoloration, no axillary or supraclavicular lymphadenopathy Abdomen: no palpable masses or tenderness, no rebound or guarding Extremities: no edema or skin discoloration or tenderness  Pelvic: Vulva: Normal             Vagina: No gross lesions or discharge  Cervix/Uterus absent  Adnexa  Without masses or tenderness  Anus: Normal   Assessment/Plan:  70 y.o. female for annual exam   1. Well female exam with routine gynecologic exam Gynecologic exam status post total hysterectomy.  Pap test negative in 06/15/17, no indication to repeat at this time.  Breast exam normal.  Screening mammogram March 2022 was negative.  Colonoscopy January 2022.  Health labs with family physician.  2. Hx of total hysterectomy  3. Postmenopause Well on no hormone replacement therapy.  4. Osteopenia of lumbar spine Bone density September 2020 showed very mild osteopenia with a T score at -1.1 at the AP spine and all other sites normal.  We will repeat a bone density next year.  Continue with vitamin D supplements, calcium intake of 1.5 g/day total and regular weightbearing physical activities.  5. Class 1 obesity due to excess calories without serious comorbidity with body mass index (BMI) of 34.0 to 34.9 in  adult Recommend a lower calorie/carb diet.  Aerobic activities 5 times a week and light weightlifting every 2 days.  Other orders - MAGNESIUM PO; Take by mouth.   Princess Bruins MD, 8:17 AM 11/05/2020

## 2020-11-15 ENCOUNTER — Other Ambulatory Visit: Payer: Medicare Other

## 2020-11-15 ENCOUNTER — Ambulatory Visit (INDEPENDENT_AMBULATORY_CARE_PROVIDER_SITE_OTHER): Payer: Medicare Other | Admitting: Medical

## 2020-11-15 ENCOUNTER — Telehealth: Payer: Self-pay | Admitting: Medical

## 2020-11-15 ENCOUNTER — Ambulatory Visit: Payer: Medicare Other | Admitting: Medical

## 2020-11-15 ENCOUNTER — Other Ambulatory Visit: Payer: Self-pay

## 2020-11-15 VITALS — BP 128/62 | HR 95 | Temp 98.5°F | Resp 18 | Ht 59.0 in | Wt 175.0 lb

## 2020-11-15 DIAGNOSIS — K760 Fatty (change of) liver, not elsewhere classified: Secondary | ICD-10-CM

## 2020-11-15 DIAGNOSIS — R82998 Other abnormal findings in urine: Secondary | ICD-10-CM

## 2020-11-15 DIAGNOSIS — R319 Hematuria, unspecified: Secondary | ICD-10-CM

## 2020-11-15 DIAGNOSIS — R1011 Right upper quadrant pain: Secondary | ICD-10-CM | POA: Diagnosis not present

## 2020-11-15 DIAGNOSIS — E559 Vitamin D deficiency, unspecified: Secondary | ICD-10-CM

## 2020-11-15 DIAGNOSIS — R1010 Upper abdominal pain, unspecified: Secondary | ICD-10-CM

## 2020-11-15 LAB — CBC WITH DIFFERENTIAL/PLATELET
Basophils Absolute: 0 10*3/uL (ref 0.0–0.1)
Basophils Relative: 0.5 % (ref 0.0–3.0)
Eosinophils Absolute: 0.2 10*3/uL (ref 0.0–0.7)
Eosinophils Relative: 2.7 % (ref 0.0–5.0)
HCT: 40.5 % (ref 36.0–46.0)
Hemoglobin: 13.2 g/dL (ref 12.0–15.0)
Lymphocytes Relative: 34.4 % (ref 12.0–46.0)
Lymphs Abs: 2.7 10*3/uL (ref 0.7–4.0)
MCHC: 32.6 g/dL (ref 30.0–36.0)
MCV: 89.4 fl (ref 78.0–100.0)
Monocytes Absolute: 0.5 10*3/uL (ref 0.1–1.0)
Monocytes Relative: 6.7 % (ref 3.0–12.0)
Neutro Abs: 4.4 10*3/uL (ref 1.4–7.7)
Neutrophils Relative %: 55.7 % (ref 43.0–77.0)
Platelets: 337 10*3/uL (ref 150.0–400.0)
RBC: 4.53 Mil/uL (ref 3.87–5.11)
RDW: 13.8 % (ref 11.5–15.5)
WBC: 8 10*3/uL (ref 4.0–10.5)

## 2020-11-15 LAB — COMPREHENSIVE METABOLIC PANEL
ALT: 22 U/L (ref 0–35)
AST: 29 U/L (ref 0–37)
Albumin: 4.4 g/dL (ref 3.5–5.2)
Alkaline Phosphatase: 101 U/L (ref 39–117)
BUN: 14 mg/dL (ref 6–23)
CO2: 26 mEq/L (ref 19–32)
Calcium: 9.9 mg/dL (ref 8.4–10.5)
Chloride: 103 mEq/L (ref 96–112)
Creatinine, Ser: 0.6 mg/dL (ref 0.40–1.20)
GFR: 91.04 mL/min (ref >60.00)
Glucose, Bld: 103 mg/dL — ABNORMAL HIGH (ref 70–99)
Potassium: 4.1 mEq/L (ref 3.5–5.1)
Sodium: 140 mEq/L (ref 135–145)
Total Bilirubin: 0.4 mg/dL (ref 0.2–1.2)
Total Protein: 7.9 g/dL (ref 6.0–8.3)

## 2020-11-15 LAB — POC URINALSYSI DIPSTICK (AUTOMATED)
Bilirubin, UA: NEGATIVE
Blood, UA: POSITIVE
Glucose, UA: NEGATIVE
Ketones, UA: NEGATIVE
Leukocytes, UA: NEGATIVE
Nitrite, UA: NEGATIVE
Protein, UA: NEGATIVE
Spec Grav, UA: 1.01 (ref 1.010–1.025)
Urobilinogen, UA: 0.2 E.U./dL
pH, UA: 6 (ref 5.0–8.0)

## 2020-11-15 LAB — LIPASE: Lipase: 11 U/L (ref 11.0–59.0)

## 2020-11-15 LAB — VITAMIN D 25 HYDROXY (VIT D DEFICIENCY, FRACTURES): VITD: 40.28 ng/mL (ref 30.00–100.00)

## 2020-11-15 MED ORDER — FAMOTIDINE 20 MG PO TABS: 20.0000 mg | ORAL_TABLET | Freq: Every day | ORAL | 3 refills | Status: DC

## 2020-11-15 NOTE — Telephone Encounter (Signed)
Future urine order placed. °

## 2020-11-15 NOTE — Progress Notes (Signed)
i  Subjective:    Patient ID: Janet Peters, female    DOB: [birth year], 71 y.o.   MRN: [id number]  HPI  Pt in for one month of abdomen discomfort. She points to epigastric, rt upper quadrant and rt lower rib pain. Pain can be present even without eating. Pt feels bloating. She is burping some intermittently. She eats recently and has to use bathroom quickly.   Pt does have history of gallbladder removed in 2010-2011.  Slight looser stools but not diarrhea. But states one day normal stools that are formed and hard. Next day can be loose.  Pt not using any meds for symptoms.  Pt states pain in rt upper quadrant is about 5/10. 11-20-2019 rt upper quadrant US showed.   IMPRESSION: Prior cholecystectomy. No evidence of biliary ductal dilatation.   Diffuse hepatic steatosis.  Pt also has low vit D. Pt is on vit D supplementation.      Review of Systems  Constitutional:  Negative for chills, fatigue and fever.  Respiratory:  Negative for cough, choking, shortness of breath and wheezing.   Cardiovascular:  Negative for chest pain and palpitations.  Gastrointestinal:  Negative for abdominal pain.  Genitourinary:  Negative for dysuria, flank pain, frequency and pelvic pain.  Musculoskeletal:  Negative for back pain.    Past Medical History:  Diagnosis Date   Allergy    seasonal allergies   Anxiety    situational    Arthritis    bilateral knees   Bradycardia    per pt report   Chest discomfort 11/19/2019   Depression    situational    Diabetes mellitus due to underlying condition with unspecified complications ([place]) [id number]   Diabetes mellitus without complication ([place])    on meds   Hyperlipidemia    on meds   Mixed dyslipidemia 01/01/2018   Osteopenia 10/2016   T score -1.3 FRAX 3.5%/0.1%     Social History   Socioeconomic History   Marital status: Married    Spouse name: Not on file   Number of children: Not on file   Years of education: Not on file    Highest education level: Not on file  Occupational History   Not on file  Tobacco Use   Smoking status: Never   Smokeless tobacco: Never  Vaping Use   Vaping Use: Never used  Substance and Sexual Activity   Alcohol use: No   Drug use: No   Sexual activity: Yes    Partners: Male    Birth control/protection: Surgical    Comment: 1st intercourse- 23, partners - 2, married- 57 yrs, hysterectomy  Other Topics Concern   Not on file  Social History Narrative   Not on file   Social Determinants of Health   Financial Resource Strain: Not on file  Food Insecurity: Not on file  Transportation Needs: Not on file  Physical Activity: Not on file  Stress: Not on file  Social Connections: Not on file  Intimate Partner Violence: Not on file    Past Surgical History:  Procedure Laterality Date   CATARACT EXTRACTION Left    CESAREAN SECTION     x2   CHOLECYSTECTOMY, LAPAROSCOPIC  2010   KNEE ARTHROSCOPY W/ MENISCAL REPAIR Left 2011   PARTIAL HYSTERECTOMY  1988    Family History  Problem Relation Age of Onset   Heart attack Maternal Grandmother    Colon polyps Neg Hx    Colon cancer Neg Hx    Esophageal cancer  Neg Hx    Stomach cancer Neg Hx    Rectal cancer Neg Hx     No Known Allergies  Current Outpatient Medications on File Prior to Visit  Medication Sig Dispense Refill   atorvastatin (LIPITOR) 10 MG tablet Take 1 tablet (10 mg total) by mouth daily. 90 tablet 3   cyclobenzaprine (FLEXERIL) 5 MG tablet 1 tab po q ha prn trapezius pain 10 tablet 0   fluticasone (FLONASE) 50 MCG/ACT nasal spray Place 2 sprays into both nostrils daily.     MAGNESIUM PO Take by mouth.     metFORMIN (GLUCOPHAGE) 500 MG tablet Take 1 tablet (500 mg total) by mouth 2 (two) times daily with a meal. 180 tablet 3   Vitamin D, Ergocalciferol, (DRISDOL) 1.25 MG (50000 UNIT) CAPS capsule Take 1 capsule (50,000 Units total) by mouth every 7 (seven) days. 8 capsule 0   No current facility-administered  medications on file prior to visit.    BP 128/62   Pulse 95   Temp 98.5 F (36.9 C)   Resp 18   Ht '4\' 11"'$  (1.499 m)   Wt 175 lb (79.4 kg)   SpO2 100%   BMI 35.35 kg/m       Objective:   Physical Exam  General Mental Status- Alert. General Appearance- Not in acute distress.   Skin General: Color- Normal Color. Moisture- Normal Moisture.  Neck Carotid Arteries- Normal color. Moisture- Normal Moisture. No carotid bruits. No JVD.  Chest and Lung Exam Auscultation: Breath Sounds:-Normal.  Cardiovascular Auscultation:Rythm- Regular. Murmurs & Other Heart Sounds:Auscultation of the heart reveals- No Murmurs.  Abdomen Inspection:-Inspeection Normal. Palpation/Percussion:Note:No mass. Palpation and Percussion of the abdomen reveal- mild faint ruq Tender, Non Distended + BS, no rebound or guarding.   Neurologic Cranial Nerve exam:- CN III-XII intact(No nystagmus), symmetric smile. Strength:- 5/5 equal and symmetric strength both upper and lower extremities.       Assessment & Plan:  For epigastric and ruq area pain will get cbc, cmp, lipase and h pylori breath test.  Recommend bland, healthy diet and rx famotadine pending lab results.  Some of symptoms related to no gallbladder and fatty liver. After lab review might repeat US of liver or rever to Gi MD.  If pattern of diarrhea were to start then get stool panel studies.  Foamy urine and abd pain. Will get UA.  Follow up 2-3 weeks or sooner if needed.  Mackie Pai, PA-C

## 2020-11-15 NOTE — Patient Instructions (Addendum)
For epigastric and ruq area pain will  get cbc, cmp, lipase and h pylori breath test.  Recommend bland, healthy diet and rx famotadine pending lab results.  Some of symptoms related to no gallbladder and fatty liver. After lab review might repeat US of liver or rever to Gi MD.  If pattern of diarrhea were to start then get stool panel studies.  Foamy urine and abd pain. Will get UA.  Follow up 2-3 weeks or sooner if needed.

## 2020-11-16 LAB — H. PYLORI BREATH TEST: H. pylori Breath Test: NOT DETECTED

## 2020-11-16 NOTE — Progress Notes (Signed)
Lvm for patient to call back about her urine results.

## 2020-11-17 ENCOUNTER — Encounter: Payer: Self-pay | Admitting: Medical

## 2020-11-17 ENCOUNTER — Telehealth: Payer: Self-pay | Admitting: Medical

## 2020-11-17 LAB — URINE CULTURE
MICRO NUMBER:: 12273115
SPECIMEN QUALITY:: ADEQUATE

## 2020-11-17 MED ORDER — AMOXICILLIN-POT CLAVULANATE 875-125 MG PO TABS: 1.0000 | ORAL_TABLET | Freq: Two times a day (BID) | ORAL | 0 refills | Status: DC

## 2020-11-17 NOTE — Telephone Encounter (Signed)
Rx augmentin sent to pt pharmacy. 

## 2020-11-18 NOTE — Progress Notes (Signed)
Patient advised of results and new prescription 

## 2020-11-19 ENCOUNTER — Other Ambulatory Visit: Payer: Self-pay

## 2020-11-19 DIAGNOSIS — E118 Type 2 diabetes mellitus with unspecified complications: Secondary | ICD-10-CM

## 2020-11-19 MED ORDER — BLOOD GLUCOSE MONITOR KIT
PACK | 0 refills | Status: DC
Start: 2020-11-19 — End: 2021-05-17

## 2020-11-19 NOTE — Progress Notes (Signed)
Meter sent

## 2020-11-22 ENCOUNTER — Other Ambulatory Visit: Payer: Self-pay

## 2020-11-22 ENCOUNTER — Ambulatory Visit (INDEPENDENT_AMBULATORY_CARE_PROVIDER_SITE_OTHER): Payer: Medicare Other | Admitting: Medical

## 2020-11-22 VITALS — BP 118/80 | HR 88 | Temp 97.7°F | Resp 18 | Ht 59.0 in | Wt 175.6 lb

## 2020-11-22 DIAGNOSIS — R1031 Right lower quadrant pain: Secondary | ICD-10-CM | POA: Diagnosis not present

## 2020-11-22 DIAGNOSIS — R1030 Lower abdominal pain, unspecified: Secondary | ICD-10-CM | POA: Diagnosis not present

## 2020-11-22 DIAGNOSIS — N390 Urinary tract infection, site not specified: Secondary | ICD-10-CM

## 2020-11-22 DIAGNOSIS — R319 Hematuria, unspecified: Secondary | ICD-10-CM | POA: Diagnosis not present

## 2020-11-22 LAB — COMPREHENSIVE METABOLIC PANEL
ALT: 21 U/L (ref 0–35)
AST: 17 U/L (ref 0–37)
Albumin: 4.2 g/dL (ref 3.5–5.2)
Alkaline Phosphatase: 98 U/L (ref 39–117)
BUN: 15 mg/dL (ref 6–23)
CO2: 25 mEq/L (ref 19–32)
Calcium: 9.5 mg/dL (ref 8.4–10.5)
Chloride: 104 mEq/L (ref 96–112)
Creatinine, Ser: 0.7 mg/dL (ref 0.40–1.20)
GFR: 87.71 mL/min (ref >60.00)
Glucose, Bld: 101 mg/dL — ABNORMAL HIGH (ref 70–99)
Potassium: 4.3 mEq/L (ref 3.5–5.1)
Sodium: 140 mEq/L (ref 135–145)
Total Bilirubin: 0.3 mg/dL (ref 0.2–1.2)
Total Protein: 7.4 g/dL (ref 6.0–8.3)

## 2020-11-22 LAB — CBC WITH DIFFERENTIAL/PLATELET
Basophils Absolute: 0 10*3/uL (ref 0.0–0.1)
Basophils Relative: 0.5 % (ref 0.0–3.0)
Eosinophils Absolute: 0.2 10*3/uL (ref 0.0–0.7)
Eosinophils Relative: 2.9 % (ref 0.0–5.0)
HCT: 39.8 % (ref 36.0–46.0)
Hemoglobin: 13.2 g/dL (ref 12.0–15.0)
Lymphocytes Relative: 36 % (ref 12.0–46.0)
Lymphs Abs: 2.7 10*3/uL (ref 0.7–4.0)
MCHC: 33.1 g/dL (ref 30.0–36.0)
MCV: 89.6 fl (ref 78.0–100.0)
Monocytes Absolute: 0.5 10*3/uL (ref 0.1–1.0)
Monocytes Relative: 6.7 % (ref 3.0–12.0)
Neutro Abs: 4 10*3/uL (ref 1.4–7.7)
Neutrophils Relative %: 53.9 % (ref 43.0–77.0)
Platelets: 311 10*3/uL (ref 150.0–400.0)
RBC: 4.45 Mil/uL (ref 3.87–5.11)
RDW: 13.7 % (ref 11.5–15.5)
WBC: 7.5 10*3/uL (ref 4.0–10.5)

## 2020-11-22 LAB — POCT URINALYSIS DIPSTICK
Bilirubin, UA: NEGATIVE
Glucose, UA: NEGATIVE
Ketones, UA: NEGATIVE
Leukocytes, UA: NEGATIVE
Nitrite, UA: NEGATIVE
Protein, UA: NEGATIVE
Spec Grav, UA: 1.015 (ref 1.010–1.025)
Urobilinogen, UA: 0.2 E.U./dL
pH, UA: 5 (ref 5.0–8.0)

## 2020-11-22 NOTE — Progress Notes (Signed)
Rx was sent last week for glucometer

## 2020-11-22 NOTE — Progress Notes (Signed)
Subjective:    Patient ID: Janet Peters, female    DOB: 05/22/1950, 70 y.o.   MRN: [id number]  HPI  Pt in for some rt lower quadrant pain that has been present for 2 weeks. Pain comes and goes. When has pain level 5/10. No pain on standing or walking. Note pain when she is lying down or laying on rt side. Pt went to gyn 2 weeks ago and described had pelvis exam. Pt does not have uterus. Still has ovaries. No pelvic US done by gyn.   I can see the gyn note.  Assessment/Plan:  70 y.o. female for annual exam    "1. Well female exam with routine gynecologic exam Gynecologic exam status post total hysterectomy.  Pap test negative in 2019, no indication to repeat at this time.  Breast exam normal.  Screening mammogram March 2022 was negative.  Colonoscopy January 2022.  Health labs with family physician.   2. Hx of total hysterectomy   3. Postmenopause Well on no hormone replacement therapy.   4. Osteopenia of lumbar spine Bone density September 2020 showed very mild osteopenia with a T score at -1.1 at the AP spine and all other sites normal.  We will repeat a bone density next year.  Continue with vitamin D supplements, calcium intake of 1.5 g/day total and regular weightbearing physical activities."   Pt also notes some pain in rt si are. No cva pain.  Recent UA showed uti. Pt is on antibiotic. Long hx of hematuria worked up by urology with negative cystoscopy.    Review of Systems  Constitutional:  Negative for chills, fatigue and fever.  Respiratory:  Negative for cough, chest tightness, shortness of breath and wheezing.   Cardiovascular:  Negative for chest pain and palpitations.  Gastrointestinal:  Positive for abdominal pain. Negative for abdominal distention, anal bleeding and blood in stool.       Rt lower quadrant/adnexal area pain.  Genitourinary:  Negative for difficulty urinating, dysuria, flank pain, frequency, pelvic pain and urgency.  Musculoskeletal:   Positive for back pain.       Rt si area pain.    Past Medical History:  Diagnosis Date   Allergy    seasonal allergies   Anxiety    situational    Arthritis    bilateral knees   Bradycardia    per pt report   Chest discomfort 11/19/2019   Depression    situational    Diabetes mellitus due to underlying condition with unspecified complications ([place]) 05/03/3783   Diabetes mellitus without complication ([place])    on meds   Hyperlipidemia    on meds   Mixed dyslipidemia 01/01/2018   Osteopenia 10/2016   T score -1.3 FRAX 3.5%/0.1%     Social History   Socioeconomic History   Marital status: Married    Spouse name: Not on file   Number of children: Not on file   Years of education: Not on file   Highest education level: Not on file  Occupational History   Not on file  Tobacco Use   Smoking status: Never   Smokeless tobacco: Never  Vaping Use   Vaping Use: Never used  Substance and Sexual Activity   Alcohol use: No   Drug use: No   Sexual activity: Yes    Partners: Male    Birth control/protection: Surgical    Comment: 1st intercourse- 23, partners - 2, married- 68 yrs, hysterectomy  Other Topics Concern   Not  on file  Social History Narrative   Not on file   Social Determinants of Health   Financial Resource Strain: Not on file  Food Insecurity: Not on file  Transportation Needs: Not on file  Physical Activity: Not on file  Stress: Not on file  Social Connections: Not on file  Intimate Partner Violence: Not on file    Past Surgical History:  Procedure Laterality Date   CATARACT EXTRACTION Left    CESAREAN SECTION     x2   CHOLECYSTECTOMY, LAPAROSCOPIC  2010   KNEE ARTHROSCOPY W/ MENISCAL REPAIR Left 2011   PARTIAL HYSTERECTOMY  1988    Family History  Problem Relation Age of Onset   Heart attack Maternal Grandmother    Colon polyps Neg Hx    Colon cancer Neg Hx    Esophageal cancer Neg Hx    Stomach cancer Neg Hx    Rectal cancer Neg Hx      No Known Allergies  Current Outpatient Medications on File Prior to Visit  Medication Sig Dispense Refill   atorvastatin (LIPITOR) 10 MG tablet Take 1 tablet (10 mg total) by mouth daily. 90 tablet 3   blood glucose meter kit and supplies KIT Dispense based on patient and insurance preference. Use up to four times daily as directed. 1 each 0   cyclobenzaprine (FLEXERIL) 5 MG tablet 1 tab po q ha prn trapezius pain 10 tablet 0   famotidine (PEPCID) 20 MG tablet Take 1 tablet (20 mg total) by mouth daily. 30 tablet 3   fluticasone (FLONASE) 50 MCG/ACT nasal spray Place 2 sprays into both nostrils daily.     MAGNESIUM PO Take by mouth.     metFORMIN (GLUCOPHAGE) 500 MG tablet Take 1 tablet (500 mg total) by mouth 2 (two) times daily with a meal. 180 tablet 3   Vitamin D, Ergocalciferol, (DRISDOL) 1.25 MG (50000 UNIT) CAPS capsule Take 1 capsule (50,000 Units total) by mouth every 7 (seven) days. 8 capsule 0   No current facility-administered medications on file prior to visit.    BP 118/80 (BP Location: Left Arm, Patient Position: Sitting, Cuff Size: Normal)   Pulse 88   Temp 97.7 F (36.5 C) (Oral)   Resp 18   Ht 4' 11"  (1.499 m)   Wt 175 lb 9.6 oz (79.7 kg)   SpO2 98%   BMI 35.47 kg/m       Objective:   Physical Exam   General- No acute distress. Pleasant patient. Neck- Full range of motion, no jvd Lungs- Clear, even and unlabored. Heart- regular rate and rhythm. Neurologic- CNII- XII grossly intact.  Back- no cva tenderness. Mild rt si tenderness to palpatoin.  Rt hip- no pain on range of motion or palpatoin. Abdomen- mild rt lower quadrant/adnexal area tenderness to palpation.      Assessment & Plan:   Recent hematuria with uti. Continue current antibiotic. Regarding blood in urine presently not referring to urologist as you report chronic intermittent hematuria with negative cystoscopy. However if you more severe symptoms such as gross hematuria would refer  back.  Mild sciatic region pain but I don't think reason for rt lower quadrant pain presently.  Rt lower quadrant pain presently. Pain 5/10. I think best to get cbc, cmp and ct abd/pelvis. With ct will get opinion on appendix appearance as well as often get some information on ovaries.  Will ask staff to get prior authorization for CT.  Follow up in 7-10 days or sooner if  needed.  If any dramatic severe pain before prior authorization done then recommend ED evaluation.  Mackie Pai, PA-C

## 2020-11-22 NOTE — Patient Instructions (Signed)
Recent hematuria with uti. Continue current antibiotic. Regarding blood in urine presently not referring to urologist as you report chronic intermittent hematuria with negative cystoscopy. However if you more severe symptoms such as gross hematuria would refer back.  Mild sciatic region pain but I don't think reason for rt lower quadrant pain presently.  Rt lower quadrant pain presently. Pain 5/10. I think best to get cbc, cmp and ct abd/pelvis. With ct will get opinion on appendix appearance as well as often get some information on ovaries.  Will ask staff to get prior authorization for CT.  Follow up in 7-10 days or sooner if needed.  If any dramatic severe pain before prior authorization done then recommend ED evaluation.

## 2020-11-25 ENCOUNTER — Ambulatory Visit (HOSPITAL_BASED_OUTPATIENT_CLINIC_OR_DEPARTMENT_OTHER)
Admission: RE | Admit: 2020-11-25 | Discharge: 2020-11-25 | Disposition: A | Discharge status: Home / Self Care | Payer: Medicare Other | Source: Ambulatory Visit | Attending: Medical | Admitting: Medical

## 2020-11-25 ENCOUNTER — Other Ambulatory Visit: Payer: Self-pay

## 2020-11-25 DIAGNOSIS — R1031 Right lower quadrant pain: Secondary | ICD-10-CM | POA: Diagnosis not present

## 2020-11-25 MED ORDER — IOHEXOL 350 MG/ML SOLN
75.0000 mL | Freq: Once | INTRAVENOUS | Status: AC | PRN
  Administered 2020-11-25: 75 mL via INTRAVENOUS

## 2020-11-26 ENCOUNTER — Encounter: Payer: Self-pay | Admitting: Medical

## 2020-11-26 ENCOUNTER — Ambulatory Visit: Payer: Medicare Other | Admitting: Medical

## 2020-12-29 ENCOUNTER — Encounter: Payer: Self-pay | Admitting: Medical

## 2020-12-30 ENCOUNTER — Telehealth (INDEPENDENT_AMBULATORY_CARE_PROVIDER_SITE_OTHER): Payer: Medicare Other | Admitting: Medical

## 2020-12-30 ENCOUNTER — Other Ambulatory Visit: Payer: Self-pay | Admitting: Medical

## 2020-12-30 DIAGNOSIS — U071 COVID-19: Secondary | ICD-10-CM

## 2020-12-30 MED ORDER — MOLNUPIRAVIR EUA 200MG CAPSULE: 4.0000 | ORAL_CAPSULE | Freq: Two times a day (BID) | ORAL | 0 refills | Status: AC

## 2020-12-30 MED ORDER — BENZONATATE 100 MG PO CAPS: 100.0000 mg | ORAL_CAPSULE | Freq: Three times a day (TID) | ORAL | 0 refills | Status: DC | PRN

## 2020-12-30 MED ORDER — FLUTICASONE PROPIONATE 50 MCG/ACT NA SUSP: 2.0000 | Freq: Every day | NASAL | 1 refills | Status: DC

## 2020-12-30 NOTE — Telephone Encounter (Signed)
Patient put on [facility] schedule for mychart 12/30/20 at 10 am

## 2020-12-30 NOTE — Progress Notes (Signed)
   Subjective:    Patient ID: Janet Peters, female    DOB: [birth year], 70 y.o.   MRN: [id number]  HPI Virtual Visit via Video Note  I connected with Janet Peters on 12/30/20 at 10:00 AM EDT by a video enabled telemedicine application and verified that I am speaking with the correct person using two identifiers.  Location: Patient: home Provider: office   I discussed the limitations of evaluation and management by telemedicine and the availability of in person appointments. The patient expressed understanding and agreed to proceed.  History of Present Illness:   Pt recently diagnosed with covid on Dec 29, 2020.  Test used- rapid and pcr. Pcr came back +.   History of covid vaccine x   Covid risk score- 5  History of covid infection-none  Current symptoms-nasal congestion, ear pressure and mild dry cough  Pt 02 sat-96%   Observations/Objective:  General-no acute distress, pleasant, oriented. Lungs- on inspection lungs appear unlabored. Neck- no tracheal deviation or jvd on inspection. Neuro- gross motor function appears intact.   Assessment and Plan: Patient Instructions  COVID infection.  Symptoms mild and started past Friday.  Tested positive yesterday by PCR.  COVID vaccine x3.  Risk factor score of 5.   Went ahead and prescribed molnupiravir as this is fifth day of illness.  Explained to patient recommendation is to start within 5 days of symptom onset.  Advised Flonase for nasal congestion and prescribed benzonatate for cough.  Can use vitamin D over-the-counter and zinc as discussed.  If signs and symptoms worsen or change let us know.  Counseled on how and when to and quarantine.  Explained CDC guidelines.  Follow-up in 7 days or sooner if needed.   Follow Up Instructions:  I discussed the assessment and treatment plan with the patient. The patient was provided an opportunity to ask questions and all were answered. The patient agreed with  the plan and demonstrated an understanding of the instructions.   The patient was advised to call back or seek an in-person evaluation if the symptoms worsen or if the condition fails to improve as anticipated.  I provided 18 minutes of  face-to-face time during this encounter.   Mackie Pai, PA-C    Review of Systems  Constitutional:  Negative for chills, fatigue and fever.  HENT:  Positive for congestion. Negative for ear pain, sinus pressure and sinus pain.        Ear pressure.  Respiratory:  Positive for cough. Negative for shortness of breath and wheezing.   Cardiovascular:  Negative for chest pain and palpitations.  Gastrointestinal:  Negative for abdominal pain.      Objective:   Physical Exam        Assessment & Plan:

## 2020-12-30 NOTE — Patient Instructions (Signed)
COVID infection.  Symptoms mild and started past Friday.  Tested positive yesterday by PCR.  COVID vaccine x3.  Risk factor score of 5.   Went ahead and prescribed molnupiravir as this is fifth day of illness.  Explained to patient recommendation is to start within 5 days of symptom onset.  Advised Flonase for nasal congestion and prescribed benzonatate for cough.  Can use vitamin D over-the-counter and zinc as discussed.  If signs and symptoms worsen or change let us know.  Counseled on how and when to and quarantine.  Explained CDC guidelines.  Follow-up in 7 days or sooner if needed.

## 2021-01-10 ENCOUNTER — Ambulatory Visit (INDEPENDENT_AMBULATORY_CARE_PROVIDER_SITE_OTHER): Payer: Medicare Other | Admitting: Medical

## 2021-01-10 ENCOUNTER — Other Ambulatory Visit: Payer: Self-pay

## 2021-01-10 VITALS — BP 114/50 | HR 100 | Temp 97.8°F | Resp 18 | Ht 59.0 in | Wt 177.2 lb

## 2021-01-10 DIAGNOSIS — U071 COVID-19: Secondary | ICD-10-CM

## 2021-01-10 DIAGNOSIS — Z1231 Encounter for screening mammogram for malignant neoplasm of breast: Secondary | ICD-10-CM

## 2021-01-10 DIAGNOSIS — E7849 Other hyperlipidemia: Secondary | ICD-10-CM | POA: Diagnosis not present

## 2021-01-10 DIAGNOSIS — E088 Diabetes mellitus due to underlying condition with unspecified complications: Secondary | ICD-10-CM

## 2021-01-10 NOTE — Progress Notes (Signed)
Subjective:    Patient ID: Janet Peters, female    DOB: [birth year], 70 y.o.   MRN: [id number]  HPI  Pt in for follow up post covid. Pt had + pcr. I saw pt on 12-30-20 by video and rx'd molnupiravir.  Pt had mild symptoms uri only. Later mild ha with mild ethmoid sinus area pain. This pain has resolved.  Pt has some mid allergies at times.  Pt has good energy. No arthralgias.  Vaccinated in past 3 times.   Pt also diabetic. Pt last a1c was 7.0. she is not fasting. Her triglycerides are elevated in the past.     Review of Systems  Constitutional:  Negative for chills, fatigue and fever.  HENT:  Negative for congestion, hearing loss, rhinorrhea, sinus pain and sore throat.   Respiratory:  Negative for cough, chest tightness, shortness of breath and wheezing.   Cardiovascular:  Negative for chest pain and palpitations.  Gastrointestinal:  Negative for abdominal pain, blood in stool, constipation, diarrhea, nausea and vomiting.  Genitourinary:  Negative for difficulty urinating and dysuria.  Musculoskeletal:  Negative for back pain, joint swelling and myalgias.  Neurological:  Negative for dizziness, seizures, light-headedness and numbness.  Hematological:  Negative for adenopathy. Does not bruise/bleed easily.  Psychiatric/Behavioral:  Negative for behavioral problems.     Past Medical History:  Diagnosis Date   Allergy    seasonal allergies   Anxiety    situational    Arthritis    bilateral knees   Bradycardia    per pt report   Chest discomfort 11/19/2019   Depression    situational    Diabetes mellitus due to underlying condition with unspecified complications ([place]) 22/04/9796   Diabetes mellitus without complication ([place])    on meds   Hyperlipidemia    on meds   Mixed dyslipidemia 01/01/2018   Osteopenia 10/2016   T score -1.3 FRAX 3.5%/0.1%     Social History   Socioeconomic History   Marital status: Married    Spouse name: Not on file   Number of  children: Not on file   Years of education: Not on file   Highest education level: Not on file  Occupational History   Not on file  Tobacco Use   Smoking status: Never   Smokeless tobacco: Never  Vaping Use   Vaping Use: Never used  Substance and Sexual Activity   Alcohol use: No   Drug use: No   Sexual activity: Yes    Partners: Male    Birth control/protection: Surgical    Comment: 1st intercourse- 23, partners - 2, married- 60 yrs, hysterectomy  Other Topics Concern   Not on file  Social History Narrative   Not on file   Social Determinants of Health   Financial Resource Strain: Not on file  Food Insecurity: Not on file  Transportation Needs: Not on file  Physical Activity: Not on file  Stress: Not on file  Social Connections: Not on file  Intimate Partner Violence: Not on file    Past Surgical History:  Procedure Laterality Date   CATARACT EXTRACTION Left    CESAREAN SECTION     x2   CHOLECYSTECTOMY, LAPAROSCOPIC  2010   KNEE ARTHROSCOPY W/ MENISCAL REPAIR Left 2011   PARTIAL HYSTERECTOMY  1988    Family History  Problem Relation Age of Onset   Heart attack Maternal Grandmother    Colon polyps Neg Hx    Colon cancer Neg Hx  Esophageal cancer Neg Hx    Stomach cancer Neg Hx    Rectal cancer Neg Hx     No Known Allergies  Current Outpatient Medications on File Prior to Visit  Medication Sig Dispense Refill   atorvastatin (LIPITOR) 10 MG tablet Take 1 tablet (10 mg total) by mouth daily. 90 tablet 3   blood glucose meter kit and supplies KIT Dispense based on patient and insurance preference. Use up to four times daily as directed. 1 each 0   cyclobenzaprine (FLEXERIL) 5 MG tablet 1 tab po q ha prn trapezius pain 10 tablet 0   famotidine (PEPCID) 20 MG tablet Take 1 tablet (20 mg total) by mouth daily. 30 tablet 3   fluticasone (FLONASE) 50 MCG/ACT nasal spray Place 2 sprays into both nostrils daily.     fluticasone (FLONASE) 50 MCG/ACT nasal spray  Place 2 sprays into both nostrils daily. 16 g 1   MAGNESIUM PO Take by mouth.     metFORMIN (GLUCOPHAGE) 500 MG tablet Take 1 tablet (500 mg total) by mouth 2 (two) times daily with a meal. 180 tablet 3   Vitamin D, Ergocalciferol, (DRISDOL) 1.25 MG (50000 UNIT) CAPS capsule Take 1 capsule (50,000 Units total) by mouth every 7 (seven) days. 8 capsule 0   No current facility-administered medications on file prior to visit.    BP (!) 114/50   Pulse 100   Temp 97.8 F (36.6 C)   Resp 18   Ht 4' 11"  (1.499 m)   Wt 177 lb 3.2 oz (80.4 kg)   SpO2 99%   BMI 35.79 kg/m       Objective:   Physical Exam  General  Mental Status - Alert. General Appearance - Well groomed. Not in acute distress.  Skin Rashes- No Rashes.  HEENT Head- Normal. Ear Auditory Canal - Left- Normal. Right - Normal.Tympanic Membrane- Left- Normal. Right- Normal. Eye Sclera/Conjunctiva- Left- Normal. Right- Normal. Nose & Sinuses Nasal Mucosa- Left-  Boggy and Congested. Right-  Boggy and  Congested.Bilateral no  maxillary and  no frontal sinus pressure. Mouth & Throat Lips: Upper Lip- Normal: no dryness, cracking, pallor, cyanosis, or vesicular eruption. Lower Lip-Normal: no dryness, cracking, pallor, cyanosis or vesicular eruption. Buccal Mucosa- Bilateral- No Aphthous ulcers. Oropharynx- No Discharge or Erythema. Tonsils: Characteristics- Bilateral- No Erythema or Congestion. Size/Enlargement- Bilateral- No enlargement. Discharge- bilateral-None.  Neck Neck- Supple. No Masses.   Chest and Lung Exam Auscultation: Breath Sounds:-Clear even and unlabored.  Cardiovascular Auscultation:Rythm- Regular, rate and rhythm. Murmurs & Other Heart Sounds:Ausculatation of the heart reveal- No Murmurs.  Lymphatic Head & Neck General Head & Neck Lymphatics: Bilateral: Description- No Localized lymphadenopathy.   Foot exam- see quality metrics.     Assessment & Plan:   Patient Instructions  Resolved covid  infection. No residual signs/symptoms. Discussed timeline of getting booster.  For diabetes get future cmp and a1c. Continue metformin and low sugar diet.  For high triglycerides and hx of diabetes continue atorvastatin. Future lipid panel.  On day you get future labs ask to get scheduled for flu vaccine.  Follow up in 3 months or sooner if needed   [employer], PA-C

## 2021-01-10 NOTE — Patient Instructions (Addendum)
Resolved covid infection. No residual signs/symptoms. Discussed timeline of getting booster.  For diabetes get future cmp and a1c. Continue metformin and low sugar diet.  For high triglycerides and hx of diabetes continue atorvastatin. Future lipid panel.  On day you get future labs ask to get scheduled for flu vaccine.  Follow up in 3 months or sooner if needed

## 2021-01-14 ENCOUNTER — Other Ambulatory Visit: Payer: Medicare Other

## 2021-01-21 ENCOUNTER — Other Ambulatory Visit (INDEPENDENT_AMBULATORY_CARE_PROVIDER_SITE_OTHER): Payer: Medicare Other

## 2021-01-21 ENCOUNTER — Encounter: Payer: Self-pay | Admitting: Medical

## 2021-01-21 ENCOUNTER — Other Ambulatory Visit: Payer: Self-pay

## 2021-01-21 ENCOUNTER — Ambulatory Visit (INDEPENDENT_AMBULATORY_CARE_PROVIDER_SITE_OTHER): Payer: Medicare Other

## 2021-01-21 DIAGNOSIS — E088 Diabetes mellitus due to underlying condition with unspecified complications: Secondary | ICD-10-CM | POA: Diagnosis not present

## 2021-01-21 DIAGNOSIS — E7849 Other hyperlipidemia: Secondary | ICD-10-CM

## 2021-01-21 DIAGNOSIS — Z23 Encounter for immunization: Secondary | ICD-10-CM | POA: Diagnosis not present

## 2021-01-21 LAB — COMPREHENSIVE METABOLIC PANEL
ALT: 17 U/L (ref 0–35)
AST: 15 U/L (ref 0–37)
Albumin: 4.3 g/dL (ref 3.5–5.2)
Alkaline Phosphatase: 98 U/L (ref 39–117)
BUN: 13 mg/dL (ref 6–23)
CO2: 26 mEq/L (ref 19–32)
Calcium: 9.4 mg/dL (ref 8.4–10.5)
Chloride: 104 mEq/L (ref 96–112)
Creatinine, Ser: 0.66 mg/dL (ref 0.40–1.20)
GFR: 88.86 mL/min (ref >60.00)
Glucose, Bld: 120 mg/dL — ABNORMAL HIGH (ref 70–99)
Potassium: 4.3 mEq/L (ref 3.5–5.1)
Sodium: 140 mEq/L (ref 135–145)
Total Bilirubin: 0.5 mg/dL (ref 0.2–1.2)
Total Protein: 7.4 g/dL (ref 6.0–8.3)

## 2021-01-21 LAB — LIPID PANEL
Cholesterol: 169 mg/dL (ref 0–200)
HDL: 50.3 mg/dL (ref >39.00)
NonHDL: 118.62
Total CHOL/HDL Ratio: 3
Triglycerides: 219 mg/dL — ABNORMAL HIGH (ref 0.0–149.0)
VLDL: 43.8 mg/dL — ABNORMAL HIGH (ref 0.0–40.0)

## 2021-01-21 LAB — LDL CHOLESTEROL, DIRECT: Direct LDL: 92 mg/dL

## 2021-01-21 LAB — HEMOGLOBIN A1C: Hgb A1c MFr Bld: 6.7 % — ABNORMAL HIGH (ref 4.6–6.5)

## 2021-01-22 LAB — MICROALBUMIN, URINE: Microalb, Ur: 0.3 mg/dL

## 2021-02-08 ENCOUNTER — Ambulatory Visit: Payer: Medicare Other | Admitting: Medical

## 2021-02-10 ENCOUNTER — Ambulatory Visit (INDEPENDENT_AMBULATORY_CARE_PROVIDER_SITE_OTHER): Payer: Medicare Other | Admitting: Medical

## 2021-02-10 ENCOUNTER — Ambulatory Visit (HOSPITAL_BASED_OUTPATIENT_CLINIC_OR_DEPARTMENT_OTHER)
Admission: RE | Admit: 2021-02-10 | Discharge: 2021-02-10 | Disposition: A | Discharge status: Home / Self Care | Payer: Medicare Other | Source: Ambulatory Visit | Attending: Medical | Admitting: Medical

## 2021-02-10 ENCOUNTER — Other Ambulatory Visit: Payer: Self-pay

## 2021-02-10 VITALS — BP 139/64 | HR 99 | Temp 98.2°F | Resp 18 | Ht 59.0 in | Wt 176.0 lb

## 2021-02-10 DIAGNOSIS — M546 Pain in thoracic spine: Secondary | ICD-10-CM | POA: Insufficient documentation

## 2021-02-10 DIAGNOSIS — R0781 Pleurodynia: Secondary | ICD-10-CM

## 2021-02-10 DIAGNOSIS — R030 Elevated blood-pressure reading, without diagnosis of hypertension: Secondary | ICD-10-CM | POA: Diagnosis not present

## 2021-02-10 MED ORDER — MELOXICAM 7.5 MG PO TABS: ORAL_TABLET | ORAL | 0 refills | Status: DC

## 2021-02-10 MED ORDER — FLUTICASONE PROPIONATE 50 MCG/ACT NA SUSP: 2.0000 | Freq: Every day | NASAL | 1 refills | Status: DC

## 2021-02-10 NOTE — Progress Notes (Signed)
Subjective:    Patient ID: Janet Peters, female    DOB: 12/27/1950, 70 y.o.   MRN: [id number]  HPI  Pt states pain for about 3 weeks. Pain started around time she was doing some back exercises at gym. Describes lat pull down and row type exercises. Describes pain in lower t-spine area and rt posterior ribs. Pt describes that come and goes feels pulsating sensation. Pt tries ibuprofen and helps just little bit. Tried 200 mg dose.   Pt also year round allergies. Spring and fall worst. She needs refill of flonase.    Review of Systems  Constitutional:  Negative for chills, fatigue and fever.  Respiratory:  Negative for cough, chest tightness, shortness of breath and wheezing.   Cardiovascular:  Negative for chest pain and palpitations.  Musculoskeletal:  Positive for back pain. Negative for gait problem, neck pain and neck stiffness.  Skin:  Negative for rash.  Neurological:  Negative for dizziness and light-headedness.  Hematological:  Negative for adenopathy. Does not bruise/bleed easily.  Psychiatric/Behavioral:  Negative for behavioral problems and confusion.    Past Medical History:  Diagnosis Date   Allergy    seasonal allergies   Anxiety    situational    Arthritis    bilateral knees   Bradycardia    per pt report   Chest discomfort 11/19/2019   Depression    situational    Diabetes mellitus due to underlying condition with unspecified complications ([place]) 01/29/7261   Diabetes mellitus without complication ([place])    on meds   Hyperlipidemia    on meds   Mixed dyslipidemia 01/01/2018   Osteopenia 10/2016   T score -1.3 FRAX 3.5%/0.1%     Social History   Socioeconomic History   Marital status: Married    Spouse name: Not on file   Number of children: Not on file   Years of education: Not on file   Highest education level: Not on file  Occupational History   Not on file  Tobacco Use   Smoking status: Never   Smokeless tobacco: Never  Vaping Use    Vaping Use: Never used  Substance and Sexual Activity   Alcohol use: No   Drug use: No   Sexual activity: Yes    Partners: Male    Birth control/protection: Surgical    Comment: 1st intercourse- 23, partners - 2, married- 46 yrs, hysterectomy  Other Topics Concern   Not on file  Social History Narrative   Not on file   Social Determinants of Health   Financial Resource Strain: Not on file  Food Insecurity: Not on file  Transportation Needs: Not on file  Physical Activity: Not on file  Stress: Not on file  Social Connections: Not on file  Intimate Partner Violence: Not on file    Past Surgical History:  Procedure Laterality Date   CATARACT EXTRACTION Left    CESAREAN SECTION     x2   CHOLECYSTECTOMY, LAPAROSCOPIC  2010   KNEE ARTHROSCOPY W/ MENISCAL REPAIR Left 2011   PARTIAL HYSTERECTOMY  1988    Family History  Problem Relation Age of Onset   Heart attack Maternal Grandmother    Colon polyps Neg Hx    Colon cancer Neg Hx    Esophageal cancer Neg Hx    Stomach cancer Neg Hx    Rectal cancer Neg Hx     No Known Allergies  Current Outpatient Medications on File Prior to Visit  Medication Sig Dispense Refill  atorvastatin (LIPITOR) 10 MG tablet Take 1 tablet (10 mg total) by mouth daily. 90 tablet 3   blood glucose meter kit and supplies KIT Dispense based on patient and insurance preference. Use up to four times daily as directed. 1 each 0   cyclobenzaprine (FLEXERIL) 5 MG tablet 1 tab po q ha prn trapezius pain 10 tablet 0   famotidine (PEPCID) 20 MG tablet Take 1 tablet (20 mg total) by mouth daily. 30 tablet 3   fluticasone (FLONASE) 50 MCG/ACT nasal spray Place 2 sprays into both nostrils daily.     fluticasone (FLONASE) 50 MCG/ACT nasal spray Place 2 sprays into both nostrils daily. 16 g 1   MAGNESIUM PO Take by mouth.     metFORMIN (GLUCOPHAGE) 500 MG tablet Take 1 tablet (500 mg total) by mouth 2 (two) times daily with a meal. 180 tablet 3   Vitamin D,  Ergocalciferol, (DRISDOL) 1.25 MG (50000 UNIT) CAPS capsule Take 1 capsule (50,000 Units total) by mouth every 7 (seven) days. 8 capsule 0   No current facility-administered medications on file prior to visit.    BP 139/64   Pulse 99   Temp 98.2 F (36.8 C)   Resp 18   Ht 4' 11"  (1.499 m)   Wt 176 lb (79.8 kg)   SpO2 99%   BMI 35.55 kg/m        Objective:   Physical Exam  General Mental Status- Alert. General Appearance- Not in acute distress.    Chest and Lung Exam Auscultation: Breath Sounds:-Normal.  Cardiovascular Auscultation:Rythm- Regular. Murmurs & Other Heart Sounds:Auscultation of the heart reveals- No Murmurs.   Neurologic Cranial Nerve exam:- CN III-XII intact(No nystagmus), symmetric smile. Strength:- 5/5 equal and symmetric strength both upper and lower extremities.   Back- rt side parathoracic spine mild  tenderness to palpation. Mild tenderness palpation rt side posterior mid to lower ribs.    Assessment & Plan:   Patient Instructions  For  new mid back and rib pain will get thoracic and rt rib xray. DC ibuprofen. Rx meloxicam 7.5 mg 1-2 tab daily.  Review xray and see how you respond. Since going on 3 weeks already if pain persists will refer to sports medicine MD.  Allergic rhinitis. Refiling flonase today.  Mild borderline elevated blood pressure. Advised to check bp in house when relaxed. But explained over next 2 weeks might be slight increased with use of nsaid.    Follow up in 10-14 days or sooner if needed.

## 2021-02-10 NOTE — Patient Instructions (Addendum)
For  new mid back and rib pain will get thoracic and rt rib xray. DC ibuprofen. Rx meloxicam 7.5 mg 1-2 tab daily.  Review xray and see how you respond. Since going on 3 weeks already if pain persists will refer to sports medicine MD.  Allergic rhinitis. Refiling flonase today.  Mild borderline elevated blood pressure. Advised to check bp in house when relaxed. But explained over next 2 weeks might be slight increased with use of nsaid.    Follow up in 10-14 days or sooner if needed.

## 2021-02-23 DIAGNOSIS — J301 Allergic rhinitis due to pollen: Secondary | ICD-10-CM | POA: Insufficient documentation

## 2021-02-23 HISTORY — DX: Allergic rhinitis due to pollen: J30.1

## 2021-02-28 ENCOUNTER — Encounter: Payer: Self-pay | Admitting: Medical

## 2021-02-28 NOTE — Addendum Note (Signed)
Addended by: Anabel Halon on: 02/28/2021 11:59 AM   Modules accepted: Orders

## 2021-03-03 ENCOUNTER — Encounter: Payer: Self-pay | Admitting: Family Medicine

## 2021-03-03 ENCOUNTER — Ambulatory Visit: Payer: Medicare Other | Admitting: Family Medicine

## 2021-03-03 VITALS — BP 128/80 | Ht 59.0 in | Wt 176.0 lb

## 2021-03-03 DIAGNOSIS — M47814 Spondylosis without myelopathy or radiculopathy, thoracic region: Secondary | ICD-10-CM

## 2021-03-03 DIAGNOSIS — M4854XA Collapsed vertebra, not elsewhere classified, thoracic region, initial encounter for fracture: Secondary | ICD-10-CM | POA: Diagnosis not present

## 2021-03-03 HISTORY — DX: Spondylosis without myelopathy or radiculopathy, thoracic region: M47.814

## 2021-03-03 MED ORDER — HYDROCODONE-ACETAMINOPHEN 5-325 MG PO TABS
1.0000 | ORAL_TABLET | Freq: Three times a day (TID) | ORAL | 0 refills | Status: DC | PRN
Start: 2021-03-03 — End: 2021-05-17

## 2021-03-03 NOTE — Assessment & Plan Note (Signed)
Symptoms seem most consistent for a mild compression fracture versus a stress fracture within the thoracic spine.  She has a history of chickenpox but does not receive the shingles vaccine.  No rashes or vesicles present and no distribution across the dermatome and more localized to the thoracic spine.  No recent repetitive coughing or respiratory infections. -Counseled on home exercise therapy and supportive care. -Norco. -MRI of the thoracic spine to evaluate for compression fracture.

## 2021-03-03 NOTE — Patient Instructions (Signed)
Nice to meet you Please try heat on the area  Please use the pain medicine as needed.  The MRI will be done at the [facility] medcenter   Please send me a message in MyChart with any questions or updates.  We'll setup a virtual visit once the MRI is resulted.   --Dr. Carlynn Herald de conocerte Por favor, intente calentar el rea. Utilice el medicamento para el dolor segn sea necesario. La resonancia magntica se Oncologist [place]. Enveme un mensaje en MyChart con cualquier pregunta o actualizacin. Programaremos una visita virtual una vez que se haya realizado la Health visitor.

## 2021-03-03 NOTE — Progress Notes (Signed)
Janet Peters - 70 y.o. female MRN [id number]  Date of birth: [birth year]  SUBJECTIVE:  Including CC & ROS.  No chief complaint on file.   Janet Peters is a 70 y.o. female that is presenting with right-sided thoracic back pain.  The pain has been present for 5 to 6 weeks.  She noticed the pain after working out in the gym.  She has been placed on meloxicam and Flexeril with no improvement.  The pain is still ongoing and moderate intensity.  She has lost height over the past few months as well as having osteopenia.  Independent review of the right rib x-ray from 11/17 shows no acute changes.  Independent review of the thoracic spine x-ray from 11/17 shows no acute changes.  A telephone Spanish interpreter was used for this visit.  Review of Systems See HPI   HISTORY: Past Medical, Surgical, Social, and Family History Reviewed & Updated per EMR.   Pertinent Historical Findings include:  Past Medical History:  Diagnosis Date   Allergy    seasonal allergies   Anxiety    situational    Arthritis    bilateral knees   Bradycardia    per pt report   Chest discomfort 11/19/2019   Depression    situational    Diabetes mellitus due to underlying condition with unspecified complications ([place]) 41/08/6061   Diabetes mellitus without complication ([place])    on meds   Hyperlipidemia    on meds   Mixed dyslipidemia 01/01/2018   Osteopenia 10/2016   T score -1.3 FRAX 3.5%/0.1%    Past Surgical History:  Procedure Laterality Date   CATARACT EXTRACTION Left    CESAREAN SECTION     x2   CHOLECYSTECTOMY, LAPAROSCOPIC  2010   KNEE ARTHROSCOPY W/ MENISCAL REPAIR Left 2011   PARTIAL HYSTERECTOMY  1988    Family History  Problem Relation Age of Onset   Heart attack Maternal Grandmother    Colon polyps Neg Hx    Colon cancer Neg Hx    Esophageal cancer Neg Hx    Stomach cancer Neg Hx    Rectal cancer Neg Hx     Social History   Socioeconomic History   Marital status:  Married    Spouse name: Not on file   Number of children: Not on file   Years of education: Not on file   Highest education level: Not on file  Occupational History   Not on file  Tobacco Use   Smoking status: Never   Smokeless tobacco: Never  Vaping Use   Vaping Use: Never used  Substance and Sexual Activity   Alcohol use: No   Drug use: No   Sexual activity: Yes    Partners: Male    Birth control/protection: Surgical    Comment: 1st intercourse- 23, partners - 2, married- 62 yrs, hysterectomy  Other Topics Concern   Not on file  Social History Narrative   Not on file   Social Determinants of Health   Financial Resource Strain: Not on file  Food Insecurity: Not on file  Transportation Needs: Not on file  Physical Activity: Not on file  Stress: Not on file  Social Connections: Not on file  Intimate Partner Violence: Not on file     PHYSICAL EXAM:  VS: BP 128/80 (BP Location: Left Arm, Patient Position: Sitting)   Ht 4\' 11"  (1.499 m)   Wt 176 lb (79.8 kg)   BMI 35.55 kg/m  Physical Exam Gen: NAD,  alert, cooperative with exam, well-appearing     ASSESSMENT & PLAN:   Non-traumatic compression fracture of seventh thoracic vertebra (HCC) Symptoms seem most consistent for a mild compression fracture versus a stress fracture within the thoracic spine.  She has a history of chickenpox but does not receive the shingles vaccine.  No rashes or vesicles present and no distribution across the dermatome and more localized to the thoracic spine.  No recent repetitive coughing or respiratory infections. -Counseled on home exercise therapy and supportive care. -Norco. -MRI of the thoracic spine to evaluate for compression fracture.

## 2021-03-12 ENCOUNTER — Ambulatory Visit (INDEPENDENT_AMBULATORY_CARE_PROVIDER_SITE_OTHER): Payer: Medicare Other

## 2021-03-12 ENCOUNTER — Other Ambulatory Visit: Payer: Self-pay

## 2021-03-12 DIAGNOSIS — M4854XA Collapsed vertebra, not elsewhere classified, thoracic region, initial encounter for fracture: Secondary | ICD-10-CM

## 2021-04-04 ENCOUNTER — Ambulatory Visit: Payer: Medicare Other | Attending: Internal Medicine

## 2021-04-04 ENCOUNTER — Ambulatory Visit: Payer: Medicare Other | Admitting: Family Medicine

## 2021-04-04 VITALS — BP 138/78 | Ht 62.0 in | Wt 175.0 lb

## 2021-04-04 DIAGNOSIS — Z23 Encounter for immunization: Secondary | ICD-10-CM

## 2021-04-04 DIAGNOSIS — M47814 Spondylosis without myelopathy or radiculopathy, thoracic region: Secondary | ICD-10-CM | POA: Diagnosis not present

## 2021-04-04 MED ORDER — PREDNISONE 5 MG PO TABS: ORAL_TABLET | ORAL | 0 refills | Status: DC

## 2021-04-04 NOTE — Assessment & Plan Note (Signed)
MRI was revealing for facet changes.  Not revealing for a compression fracture. -Counseled on home exercise therapy and supportive care. -Prednisone. -Could consider facet injections.

## 2021-04-04 NOTE — Progress Notes (Signed)
° °  Covid-19 Vaccination Clinic  Name:  Janet Peters    MRN: [id number] DOB: 1950/10/19  04/04/2021  [name] was observed post Covid-19 immunization for 15 minutes without incident. She was provided with Vaccine Information Sheet and instruction to access the V-Safe system.   [name] was instructed to call 911 with any severe reactions post vaccine: Difficulty breathing  Swelling of face and throat  A fast heartbeat  A bad rash all over body  Dizziness and weakness   Immunizations Administered     Name Date Dose VIS Date Route   Pfizer Covid-19 Vaccine Bivalent Booster 04/04/2021  9:56 AM 0.3 mL 11/24/2020 Intramuscular   Manufacturer: [employer]   Lot: [id number]   [place]: [phone]

## 2021-04-04 NOTE — Progress Notes (Signed)
°  Janet Peters - 71 y.o. female MRN [id number]  Date of birth: [birth year]  SUBJECTIVE:  Including CC & ROS.  No chief complaint on file.   Janet Peters is a 71 y.o. female that is following up after the MRI of her thoracic spine.  This was revealing for facet arthropathy at multiple levels.  She continues to have pain in the thoracic region with.   Independent review of MRI of thoracic spine from 03/12/2021  Was showing facet changes at multiple levels.  Interview was conducted with a Spanish in person interpreter.  Review of Systems See HPI   HISTORY: Past Medical, Surgical, Social, and Family History Reviewed & Updated per EMR.   Pertinent Historical Findings include:  Past Medical History:  Diagnosis Date   Allergy    seasonal allergies   Anxiety    situational    Arthritis    bilateral knees   Bradycardia    per pt report   Chest discomfort 11/19/2019   Depression    situational    Diabetes mellitus due to underlying condition with unspecified complications ([place]) 72/07/3662   Diabetes mellitus without complication ([place])    on meds   Hyperlipidemia    on meds   Mixed dyslipidemia 01/01/2018   Osteopenia 10/2016   T score -1.3 FRAX 3.5%/0.1%    Past Surgical History:  Procedure Laterality Date   CATARACT EXTRACTION Left    CESAREAN SECTION     x2   CHOLECYSTECTOMY, LAPAROSCOPIC  2010   KNEE ARTHROSCOPY W/ MENISCAL REPAIR Left 2011   PARTIAL HYSTERECTOMY  1988     PHYSICAL EXAM:  VS: BP 138/78    Ht 5\' 2"  (1.575 m)    Wt 175 lb (79.4 kg)    BMI 32.01 kg/m  Physical Exam Gen: NAD, alert, cooperative with exam, well-appearing MSK:  Neurovascularly intact       ASSESSMENT & PLAN:   Arthropathy of thoracic facet joint MRI was revealing for facet changes.  Not revealing for a compression fracture. -Counseled on home exercise therapy and supportive care. -Prednisone. -Could consider facet injections.

## 2021-04-04 NOTE — Patient Instructions (Signed)
Good to see you Please try heat  Please try the medicine  Please continue exercising   Please send me a message in MyChart with any questions or updates.  Please see me back in 4-6 weeks.   --Dr. Lorina Rabon bueno verte Por favor, prueba con calor Por favor prueba la medicina por favor sigue haciendo ejercicio Enveme un mensaje en MyChart con cualquier pregunta o actualizacin. Vame de vuelta en 4-6 semanas.

## 2021-04-07 ENCOUNTER — Telehealth: Payer: Self-pay

## 2021-04-07 ENCOUNTER — Other Ambulatory Visit (HOSPITAL_BASED_OUTPATIENT_CLINIC_OR_DEPARTMENT_OTHER): Payer: Self-pay

## 2021-04-07 MED ORDER — PFIZER COVID-19 VAC BIVALENT 30 MCG/0.3ML IM SUSP
INTRAMUSCULAR | 0 refills | Status: DC
  Filled 2021-04-07: qty 0.3, 1d supply, fill #0

## 2021-04-07 NOTE — Addendum Note (Signed)
Addended by: Anabel Halon on: 04/07/2021 03:16 PM   Modules accepted: Orders

## 2021-04-07 NOTE — Telephone Encounter (Signed)
Patient's husband notified and transferred husband to imaging

## 2021-04-07 NOTE — Telephone Encounter (Signed)
Pt's Spouse, Cory Roughen, called stating pt will be due for mammogram, last one was done 06/21/20 here at [place].  He is requesting order be put in for scheduling.

## 2021-05-16 ENCOUNTER — Encounter: Payer: Self-pay | Admitting: Family Medicine

## 2021-05-16 ENCOUNTER — Ambulatory Visit: Payer: Medicare Other | Admitting: Family Medicine

## 2021-05-16 VITALS — BP 140/90 | Ht 62.0 in | Wt 175.0 lb

## 2021-05-16 DIAGNOSIS — M533 Sacrococcygeal disorders, not elsewhere classified: Secondary | ICD-10-CM

## 2021-05-16 DIAGNOSIS — M47814 Spondylosis without myelopathy or radiculopathy, thoracic region: Secondary | ICD-10-CM | POA: Diagnosis not present

## 2021-05-16 HISTORY — DX: Sacrococcygeal disorders, not elsewhere classified: M53.3

## 2021-05-16 NOTE — Assessment & Plan Note (Signed)
Acute on chronic in nature.  She has a back mouse on the right SI joint. -Counseled on home exercise therapy and supportive care. -Could consider injection or physical therapy.

## 2021-05-16 NOTE — Progress Notes (Signed)
°  Janet Peters - 71 y.o. female MRN [id number]  Date of birth: 1950/11/19  SUBJECTIVE:  Including CC & ROS.  No chief complaint on file.   Janet Peters is a 71 y.o. female that is following as to the mid back pain and presenting with acute on chronic low back pain.  She has been doing well in her upper back since the medication.  She has intermittent low back pain overlying the SI joints.  A Spanish in person interpreter was used for this interview.  Review of Systems See HPI   HISTORY: Past Medical, Surgical, Social, and Family History Reviewed & Updated per EMR.   Pertinent Historical Findings include:  Past Medical History:  Diagnosis Date   Allergy    seasonal allergies   Anxiety    situational    Arthritis    bilateral knees   Bradycardia    per pt report   Chest discomfort 11/19/2019   Depression    situational    Diabetes mellitus due to underlying condition with unspecified complications ([place]) 97/0/2637   Diabetes mellitus without complication ([place])    on meds   Hyperlipidemia    on meds   Mixed dyslipidemia 01/01/2018   Osteopenia 10/2016   T score -1.3 FRAX 3.5%/0.1%    Past Surgical History:  Procedure Laterality Date   CATARACT EXTRACTION Left    CESAREAN SECTION     x2   CHOLECYSTECTOMY, LAPAROSCOPIC  2010   KNEE ARTHROSCOPY W/ MENISCAL REPAIR Left 2011   PARTIAL HYSTERECTOMY  1988     PHYSICAL EXAM:  VS: BP 140/90 (BP Location: Left Arm, Patient Position: Sitting)    Ht 5\' 2"  (1.575 m)    Wt 175 lb (79.4 kg)    BMI 32.01 kg/m  Physical Exam Gen: NAD, alert, cooperative with exam, well-appearing MSK:  Neurovascularly intact       ASSESSMENT & PLAN:   Arthropathy of thoracic facet joint Significantly improved with measures thus far. -Counseled on home exercise therapy and supportive care. -Could consider injection.  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  She has a back mouse on the right SI  joint. -Counseled on home exercise therapy and supportive care. -Could consider injection or physical therapy.

## 2021-05-16 NOTE — Patient Instructions (Signed)
Good to see you Please try the exercises  Please try heat   Please send me a message in MyChart with any questions or updates.  Please see me back in 6-8 weeks.   --Dr. Raeford Razor

## 2021-05-16 NOTE — Assessment & Plan Note (Signed)
Significantly improved with measures thus far. -Counseled on home exercise therapy and supportive care. -Could consider injection.

## 2021-05-17 ENCOUNTER — Ambulatory Visit (INDEPENDENT_AMBULATORY_CARE_PROVIDER_SITE_OTHER): Payer: Medicare Other | Admitting: Medical

## 2021-05-17 ENCOUNTER — Encounter: Payer: Self-pay | Admitting: Medical

## 2021-05-17 ENCOUNTER — Other Ambulatory Visit: Payer: Self-pay | Admitting: Medical

## 2021-05-17 VITALS — BP 140/80 | HR 100 | Resp 18 | Ht 62.0 in | Wt 176.0 lb

## 2021-05-17 DIAGNOSIS — Z794 Long term (current) use of insulin: Secondary | ICD-10-CM

## 2021-05-17 DIAGNOSIS — R1013 Epigastric pain: Secondary | ICD-10-CM | POA: Diagnosis not present

## 2021-05-17 DIAGNOSIS — I10 Essential (primary) hypertension: Secondary | ICD-10-CM | POA: Diagnosis not present

## 2021-05-17 DIAGNOSIS — E7849 Other hyperlipidemia: Secondary | ICD-10-CM | POA: Diagnosis not present

## 2021-05-17 DIAGNOSIS — E118 Type 2 diabetes mellitus with unspecified complications: Secondary | ICD-10-CM

## 2021-05-17 LAB — HEMOGLOBIN A1C: Hgb A1c MFr Bld: 6.8 % — ABNORMAL HIGH (ref 4.6–6.5)

## 2021-05-17 LAB — CBC WITH DIFFERENTIAL/PLATELET
Basophils Absolute: 0 10*3/uL (ref 0.0–0.1)
Basophils Relative: 0.5 % (ref 0.0–3.0)
Eosinophils Absolute: 0.2 10*3/uL (ref 0.0–0.7)
Eosinophils Relative: 2.3 % (ref 0.0–5.0)
HCT: 40.5 % (ref 36.0–46.0)
Hemoglobin: 13.3 g/dL (ref 12.0–15.0)
Lymphocytes Relative: 30.1 % (ref 12.0–46.0)
Lymphs Abs: 2.7 10*3/uL (ref 0.7–4.0)
MCHC: 32.8 g/dL (ref 30.0–36.0)
MCV: 89.6 fl (ref 78.0–100.0)
Monocytes Absolute: 0.5 10*3/uL (ref 0.1–1.0)
Monocytes Relative: 5.9 % (ref 3.0–12.0)
Neutro Abs: 5.5 10*3/uL (ref 1.4–7.7)
Neutrophils Relative %: 61.2 % (ref 43.0–77.0)
Platelets: 344 10*3/uL (ref 150.0–400.0)
RBC: 4.52 Mil/uL (ref 3.87–5.11)
RDW: 13.8 % (ref 11.5–15.5)
WBC: 9 10*3/uL (ref 4.0–10.5)

## 2021-05-17 LAB — COMPREHENSIVE METABOLIC PANEL
ALT: 18 U/L (ref 0–35)
AST: 15 U/L (ref 0–37)
Albumin: 4.5 g/dL (ref 3.5–5.2)
Alkaline Phosphatase: 107 U/L (ref 39–117)
BUN: 16 mg/dL (ref 6–23)
CO2: 28 mEq/L (ref 19–32)
Calcium: 9.7 mg/dL (ref 8.4–10.5)
Chloride: 104 mEq/L (ref 96–112)
Creatinine, Ser: 0.66 mg/dL (ref 0.40–1.20)
GFR: 88.66 mL/min (ref >60.00)
Glucose, Bld: 119 mg/dL — ABNORMAL HIGH (ref 70–99)
Potassium: 4 mEq/L (ref 3.5–5.1)
Sodium: 140 mEq/L (ref 135–145)
Total Bilirubin: 0.5 mg/dL (ref 0.2–1.2)
Total Protein: 7.5 g/dL (ref 6.0–8.3)

## 2021-05-17 LAB — LIPASE: Lipase: 20 U/L (ref 11.0–59.0)

## 2021-05-17 MED ORDER — LOSARTAN POTASSIUM 50 MG PO TABS: 50.0000 mg | ORAL_TABLET | Freq: Every day | ORAL | 0 refills | Status: DC

## 2021-05-17 MED ORDER — FAMOTIDINE 20 MG PO TABS: 20.0000 mg | ORAL_TABLET | Freq: Every day | ORAL | 3 refills | Status: DC

## 2021-05-17 NOTE — Progress Notes (Signed)
Subjective:    Patient ID: Janet Peters, female    DOB: Apr 21, 1950, 71 y.o.   MRN: [id number]  HPI Pt has some mild low level ha level 3/10 at time. Pt thinks her bp was 546 at time systolic or very close. No cardiace or neurologic signs or symptoms. Bp has been mild high over past 2 months.  Last visit had bp concerns.   Pt also abdomen pain. Feels like mid epigastric pain after eating. She feel like gets full easy. 3 bm daily but not diarrhea. Pt does not have gallbladder. No fever, no chills, no vomiting.      Review of Systems  Constitutional:  Negative for chills, fatigue and fever.  Respiratory:  Negative for cough, chest tightness and shortness of breath.   Cardiovascular:  Negative for chest pain and palpitations.  Gastrointestinal:  Positive for abdominal pain. Negative for diarrhea, nausea and rectal pain.  Musculoskeletal:  Negative for back pain, myalgias and neck pain.  Skin:  Negative for rash.  Neurological:  Negative for dizziness, seizures, light-headedness and headaches.  Psychiatric/Behavioral:  Negative for behavioral problems and confusion.     Past Medical History:  Diagnosis Date   Allergy    seasonal allergies   Anxiety    situational    Arthritis    bilateral knees   Bradycardia    per pt report   Chest discomfort 11/19/2019   Depression    situational    Diabetes mellitus due to underlying condition with unspecified complications ([place]) 27/0/3500   Diabetes mellitus without complication ([place])    on meds   Hyperlipidemia    on meds   Mixed dyslipidemia 01/01/2018   Osteopenia 10/2016   T score -1.3 FRAX 3.5%/0.1%     Social History   Socioeconomic History   Marital status: Married    Spouse name: Not on file   Number of children: Not on file   Years of education: Not on file   Highest education level: Not on file  Occupational History   Not on file  Tobacco Use   Smoking status: Never   Smokeless tobacco: Never  Vaping Use    Vaping Use: Never used  Substance and Sexual Activity   Alcohol use: No   Drug use: No   Sexual activity: Yes    Partners: Male    Birth control/protection: Surgical    Comment: 1st intercourse- 23, partners - 2, married- 70 yrs, hysterectomy  Other Topics Concern   Not on file  Social History Narrative   Not on file   Social Determinants of Health   Financial Resource Strain: Not on file  Food Insecurity: Not on file  Transportation Needs: Not on file  Physical Activity: Not on file  Stress: Not on file  Social Connections: Not on file  Intimate Partner Violence: Not on file    Past Surgical History:  Procedure Laterality Date   CATARACT EXTRACTION Left    CESAREAN SECTION     x2   CHOLECYSTECTOMY, LAPAROSCOPIC  2010   KNEE ARTHROSCOPY W/ MENISCAL REPAIR Left 2011   PARTIAL HYSTERECTOMY  1988    Family History  Problem Relation Age of Onset   Heart attack Maternal Grandmother    Colon polyps Neg Hx    Colon cancer Neg Hx    Esophageal cancer Neg Hx    Stomach cancer Neg Hx    Rectal cancer Neg Hx     No Known Allergies  Current Outpatient Medications on  File Prior to Visit  Medication Sig Dispense Refill   fluticasone (FLONASE) 50 MCG/ACT nasal spray Place 2 sprays into both nostrils daily. 16 g 1   MAGNESIUM PO Take by mouth.     metFORMIN (GLUCOPHAGE) 500 MG tablet Take 1 tablet (500 mg total) by mouth 2 (two) times daily with a meal. 180 tablet 3   Vitamin D, Ergocalciferol, (DRISDOL) 1.25 MG (50000 UNIT) CAPS capsule Take 1 capsule (50,000 Units total) by mouth every 7 (seven) days. 8 capsule 0   No current facility-administered medications on file prior to visit.    BP 134/60    Pulse 100    Resp 18    Ht 5\' 2"  (1.575 m)    Wt 176 lb (79.8 kg)    SpO2 96%    BMI 32.19 kg/m       Objective:   Physical Exam  General Mental Status- Alert. General Appearance- Not in acute distress.   Skin General: Color- Normal Color. Moisture- Normal  Moisture.  Neck Carotid Arteries- Normal color. Moisture- Normal Moisture. No carotid bruits. No JVD.  Chest and Lung Exam Auscultation: Breath Sounds:-Normal.  Cardiovascular Auscultation:Rythm- Regular. Murmurs & Other Heart Sounds:Auscultation of the heart reveals- No Murmurs.  Abdomen Inspection:-Inspeection Normal. Palpation/Percussion:Note:No mass. Palpation and Percussion of the abdomen reveal- Non Tender, Non Distended + BS, no rebound or guarding.   Neurologic Cranial Nerve exam:- CN III-XII intact(No nystagmus), symmetric smile. Strength:- 5/5 equal and symmetric strength both upper and lower extremities.       Assessment & Plan:   Patient Instructions  Htn- I think your low level ha may be from bp elevation. No cardiac or neuro symptoms. If with HA any motor or sensory symptoms be seen in ED. Will rx losartan 25 mg and see if bp reduction stops ha.  Epigastric pain. Rx famotadine. Labs cbc, cmp, lipase and h pylori test.  Diabetes- continue metformin and get a1c.  Follow up in 10-14 days or sooner if needed.   Mackie Pai, PA-C

## 2021-05-17 NOTE — Addendum Note (Signed)
Addended by: Manuela Schwartz on: 05/17/2021 08:32 AM   Modules accepted: Orders

## 2021-05-17 NOTE — Patient Instructions (Addendum)
Htn- I think your low level ha may be from bp elevation. No cardiac or neuro symptoms. If with HA any motor or sensory symptoms be seen in ED. Will rx losartan 25 mg and see if bp reduction stops ha.  Epigastric pain. Rx famotadine. Labs cbc, cmp, lipase and h pylori test.  Diabetes- continue metformin and get a1c.  High cholesterol- repeat lipid panel. Continue statin.  Follow up in 10-14 days or sooner if needed.

## 2021-05-18 LAB — H. PYLORI BREATH TEST: H. pylori Breath Test: NOT DETECTED

## 2021-06-01 ENCOUNTER — Other Ambulatory Visit: Payer: Self-pay | Admitting: Medical

## 2021-06-01 ENCOUNTER — Ambulatory Visit (INDEPENDENT_AMBULATORY_CARE_PROVIDER_SITE_OTHER): Payer: Medicare Other | Admitting: Medical

## 2021-06-01 VITALS — BP 133/60 | HR 99 | Temp 98.0°F | Resp 18 | Ht 62.0 in | Wt 174.6 lb

## 2021-06-01 DIAGNOSIS — R1013 Epigastric pain: Secondary | ICD-10-CM

## 2021-06-01 DIAGNOSIS — Z794 Long term (current) use of insulin: Secondary | ICD-10-CM

## 2021-06-01 DIAGNOSIS — I10 Essential (primary) hypertension: Secondary | ICD-10-CM | POA: Diagnosis not present

## 2021-06-01 DIAGNOSIS — G44209 Tension-type headache, unspecified, not intractable: Secondary | ICD-10-CM

## 2021-06-01 DIAGNOSIS — E118 Type 2 diabetes mellitus with unspecified complications: Secondary | ICD-10-CM | POA: Diagnosis not present

## 2021-06-01 DIAGNOSIS — F419 Anxiety disorder, unspecified: Secondary | ICD-10-CM

## 2021-06-01 MED ORDER — FLUTICASONE PROPIONATE 50 MCG/ACT NA SUSP: 2.0000 | Freq: Every day | NASAL | 1 refills | Status: DC

## 2021-06-01 MED ORDER — METFORMIN HCL 500 MG PO TABS: 500.0000 mg | ORAL_TABLET | Freq: Two times a day (BID) | ORAL | 3 refills | Status: DC

## 2021-06-01 NOTE — Patient Instructions (Addendum)
Htn- well controlled. Continue losartan. With bp well controlled and still occasional ha I don't think bp was cause. ? ?On discussion some admitted chronic low level stress/daily anxiety. Lower end score of 5 on GAD-7. Rt trapezius pain on palpation. Possible tension ha. Offered med such as buspar to see if anxiety related/tension related ha  but you declined. If ha more constant could try muscle relaxant in combination with tylenol.  ? ?Also keep in mind stronger with longer duration, more frequent and more intense consider ct imagaing. I don't think indicated presently. ? ?Diabetes controlled. Continue metformin. ? ?Gerd symptoms recently now resolved with famotadine.  ? ?Allergies- controlled presently. Refilling flonase. ? ?Follow up in 3 months or sooner if needed. ?

## 2021-06-01 NOTE — Progress Notes (Signed)
? ?Subjective:  ? ? Patient ID: Janet Peters, female    DOB: [birth year], 71 y.o.   MRN: [id number] ? ?HPI ?Pt in for follow up.  ? ?Last visit had htn- I rx'd losartan 25 mg. Bp levels 120-130/60-70.  ? ?Also had low level ha at times. Still infrequent and associates with neck pain. No gross motor or sensory deficits. Pt thinks maybe stress. Random episodes of second of anxiety. Pt ha is transient ha more minutes at time. ? ?Pt states she feel low level anxiety all the time. States for year. ? ?Pt had epigastric pain on last visit. She felt full on last visit. She started famotadine and states that symptom is now resolved. ? ?Recent a1c was 6.8. Pt is on metformin. ? ? ?Review of Systems  ?Constitutional:  Negative for chills, diaphoresis and fatigue.  ?Respiratory:  Negative for cough, chest tightness, shortness of breath and wheezing.   ?Cardiovascular:  Negative for chest pain and palpitations.  ?Gastrointestinal:  Negative for abdominal pain and blood in stool.  ?Musculoskeletal:  Negative for back pain and joint swelling.  ?Skin:  Negative for rash.  ?Neurological:  Negative for dizziness, seizures, numbness and headaches.  ?Hematological:  Negative for adenopathy. Does not bruise/bleed easily.  ?Psychiatric/Behavioral:  Negative for behavioral problems, confusion, dysphoric mood, hallucinations, sleep disturbance and suicidal ideas. The patient is not nervous/anxious.   ? ? ?Past Medical History:  ?Diagnosis Date  ? Allergy   ? seasonal allergies  ? Anxiety   ? situational   ? Arthritis   ? bilateral knees  ? Bradycardia   ? per pt report  ? Chest discomfort 11/19/2019  ? Depression   ? situational   ? Diabetes mellitus due to underlying condition with unspecified complications ([place]) 07/27/9765  ? Diabetes mellitus without complication ([place])   ? on meds  ? Hyperlipidemia   ? on meds  ? Mixed dyslipidemia 01/01/2018  ? Osteopenia 10/2016  ? T score -1.3 FRAX 3.5%/0.1%  ? ?  ?Social History   ? ?Socioeconomic History  ? Marital status: Married  ?  Spouse name: Not on file  ? Number of children: Not on file  ? Years of education: Not on file  ? Highest education level: Not on file  ?Occupational History  ? Not on file  ?Tobacco Use  ? Smoking status: Never  ? Smokeless tobacco: Never  ?Vaping Use  ? Vaping Use: Never used  ?Substance and Sexual Activity  ? Alcohol use: No  ? Drug use: No  ? Sexual activity: Yes  ?  Partners: Male  ?  Birth control/protection: Surgical  ?  Comment: 1st intercourse- 23, partners - 24, married- 55 yrs, hysterectomy  ?Other Topics Concern  ? Not on file  ?Social History Narrative  ? Not on file  ? ?Social Determinants of Health  ? ?Financial Resource Strain: Not on file  ?Food Insecurity: Not on file  ?Transportation Needs: Not on file  ?Physical Activity: Not on file  ?Stress: Not on file  ?Social Connections: Not on file  ?Intimate Partner Violence: Not on file  ? ? ?Past Surgical History:  ?Procedure Laterality Date  ? CATARACT EXTRACTION Left   ? CESAREAN SECTION    ? x2  ? CHOLECYSTECTOMY, LAPAROSCOPIC  2010  ? KNEE ARTHROSCOPY W/ MENISCAL REPAIR Left 2011  ? PARTIAL HYSTERECTOMY  1988  ? ? ?Family History  ?Problem Relation Age of Onset  ? Heart attack Maternal Grandmother   ? Colon polyps  Neg Hx   ? Colon cancer Neg Hx   ? Esophageal cancer Neg Hx   ? Stomach cancer Neg Hx   ? Rectal cancer Neg Hx   ? ? ?No Known Allergies ? ?Current Outpatient Medications on File Prior to Visit  ?Medication Sig Dispense Refill  ? famotidine (PEPCID) 20 MG tablet Take 1 tablet (20 mg total) by mouth daily. 30 tablet 3  ? fluticasone (FLONASE) 50 MCG/ACT nasal spray Place 2 sprays into both nostrils daily. 16 g 1  ? losartan (COZAAR) 50 MG tablet TAKE 1 TABLET(50 MG) BY MOUTH DAILY 90 tablet 0  ? MAGNESIUM PO Take by mouth.    ? metFORMIN (GLUCOPHAGE) 500 MG tablet Take 1 tablet (500 mg total) by mouth 2 (two) times daily with a meal. 180 tablet 3  ? Vitamin D, Ergocalciferol, (DRISDOL)  1.25 MG (50000 UNIT) CAPS capsule Take 1 capsule (50,000 Units total) by mouth every 7 (seven) days. 8 capsule 0  ? ?No current facility-administered medications on file prior to visit.  ? ? ?BP 133/60   Pulse 99   Temp 98 ?F (36.7 ?C)   Resp 18   Ht '5\' 2"'$  (1.575 m)   Wt 174 lb 9.6 oz (79.2 kg)   SpO2 99%   BMI 31.93 kg/m?  ?  ?   ?Objective:  ? Physical Exam ? ?General ?Mental Status- Alert. General Appearance- Not in acute distress.  ? ?Skin ?General: Color- Normal Color. Moisture- Normal Moisture. ? ?Neck ?Carotid Arteries- Normal color. Moisture- Normal Moisture. No carotid bruits. No JVD. Rt side trapezius tenderness to palpation. No mid c spine tenderness.  ? ?Chest and Lung Exam ?Auscultation: ?Breath Sounds:-Normal. ? ?Cardiovascular ?Auscultation:Rythm- Regular. ?Murmurs & Other Heart Sounds:Auscultation of the heart reveals- No Murmurs. ? ?Abdomen ?Inspection:-Inspeection Normal. ?Palpation/Percussion:Note:No mass. Palpation and Percussion of the abdomen reveal- Non Tender, Non Distended + BS, no rebound or guarding. ? ? ?Neurologic ?Cranial Nerve exam:- CN III-XII intact(No nystagmus), symmetric smile. ?Drift Test:- No drift. ?Romberg Exam:- Negative.  ?Finger to Nose:- Normal/Intact ?Strength:- 5/5 equal and symmetric strength both upper and lower extremities.  ? ? ?   ?Assessment & Plan:  ? ?Patient Instructions  ?Htn- well controlled. Continue losartan. With bp well controlled and still occasional ha I don't think bp was cause. ? ?On discussion some admitted chronic low level stress/daily anxiety. Lower end score of 5 on GAD-7. Rt trapezius pain on palpation. Possible tension ha. Offered med such as buspar to see if anxiety related/tension related ha  but you declined. If ha more constant could try muscle relaxant in combination with tylenol.  ? ?Also keep in mind stronger with longer duration, more frequent and more intense consider ct imagaing. I don't think indicated presently. ? ?Diabetes  controlled. Continue metformin. ? ?Gerd symptoms recently now resolved with famotadine.  ? ?Follow up in 3 months or sooner if needed.  ?

## 2021-06-03 ENCOUNTER — Other Ambulatory Visit: Payer: Self-pay | Admitting: Medical

## 2021-06-21 ENCOUNTER — Other Ambulatory Visit: Payer: Self-pay | Admitting: Medical

## 2021-06-23 ENCOUNTER — Ambulatory Visit (HOSPITAL_BASED_OUTPATIENT_CLINIC_OR_DEPARTMENT_OTHER): Payer: Medicare Other

## 2021-06-27 ENCOUNTER — Ambulatory Visit (HOSPITAL_BASED_OUTPATIENT_CLINIC_OR_DEPARTMENT_OTHER)
Admission: RE | Admit: 2021-06-27 | Discharge: 2021-06-27 | Disposition: A | Discharge status: Home / Self Care | Payer: Medicare Other | Source: Ambulatory Visit | Attending: Medical | Admitting: Medical

## 2021-06-27 ENCOUNTER — Encounter (HOSPITAL_BASED_OUTPATIENT_CLINIC_OR_DEPARTMENT_OTHER): Payer: Self-pay

## 2021-06-27 DIAGNOSIS — Z1231 Encounter for screening mammogram for malignant neoplasm of breast: Secondary | ICD-10-CM | POA: Insufficient documentation

## 2021-06-28 ENCOUNTER — Other Ambulatory Visit: Payer: Self-pay

## 2021-06-28 DIAGNOSIS — R21 Rash and other nonspecific skin eruption: Secondary | ICD-10-CM

## 2021-06-28 HISTORY — DX: Rash and other nonspecific skin eruption: R21

## 2021-07-04 ENCOUNTER — Ambulatory Visit: Payer: Medicare Other | Admitting: Cardiology

## 2021-07-04 ENCOUNTER — Encounter: Payer: Self-pay | Admitting: Cardiology

## 2021-07-04 ENCOUNTER — Ambulatory Visit: Payer: Medicare Other | Admitting: Family Medicine

## 2021-07-04 VITALS — BP 146/76 | HR 100 | Ht 62.0 in | Wt 179.0 lb

## 2021-07-04 DIAGNOSIS — I700 Atherosclerosis of aorta: Secondary | ICD-10-CM

## 2021-07-04 DIAGNOSIS — E088 Diabetes mellitus due to underlying condition with unspecified complications: Secondary | ICD-10-CM

## 2021-07-04 DIAGNOSIS — E782 Mixed hyperlipidemia: Secondary | ICD-10-CM

## 2021-07-04 DIAGNOSIS — E66811 Obesity, class 1: Secondary | ICD-10-CM

## 2021-07-04 DIAGNOSIS — E669 Obesity, unspecified: Secondary | ICD-10-CM

## 2021-07-04 HISTORY — DX: Obesity, unspecified: E66.9

## 2021-07-04 HISTORY — DX: Obesity, class 1: E66.811

## 2021-07-04 HISTORY — DX: Atherosclerosis of aorta: I70.0

## 2021-07-04 MED ORDER — ATORVASTATIN CALCIUM 10 MG PO TABS: 10.0000 mg | ORAL_TABLET | Freq: Every day | ORAL | 3 refills | Status: DC

## 2021-07-04 NOTE — Patient Instructions (Addendum)
Instrucciones de medicamentos: ?Su m?dico recomienda que contin?e con sus medicamentos actuales seg?n las indicaciones. Consulte la lista de medicamentos actuales que se le entreg? hoy. ?*Si necesita un resurtido de sus medicamentos card?acos antes de su pr?xima cita, llame a su farmacia* ? ? ?[employer] de laboratorio: ?Ninguno ordenado ?Si se le realizaron an?lisis de laboratorio (an?lisis de [place]) hoy y sus pruebas son completamente normales, recibir? sus resultados solo cuando: ? Mensaje de MyChart (si tiene Pharmacist, community) O ? Una copia en papel por correo ?Si tiene [place] prueba de laboratorio que es anormal o necesitamos cambiar su tratamiento, lo llamaremos para [employer]. ? ? ?Pruebas/Procedimientos: ?Ninguno ordenado ? ? ?Hacer un seguimiento: ?En [facility], usted y sus necesidades de salud son Cleotis Nipper prioridad. Como parte de [place] misi?n continua de brindarle una atenci?n card?aca excepcional, hemos creado equipos de atenci?n de proveedores designados. Estos equipos de atenci?n incluyen a su cardi?logo primario (m?dico) y proveedores de pr?ctica avanzada (APP, asistentes m?dicos y enfermeras practicantes) que trabajan juntos para brindarle la atenci?n que necesita, cuando la necesita. ? ?Recomendamos registrarse en el portal del paciente llamado "MyChart". La informaci?n de registro se proporciona en este Resumen posterior a la visita. MyChart se [place] para conectarse con pacientes para visitas virtuales (telemedicina). Los [employer] de laboratorio/pruebas, notas de encuentros, pr?ximas citas, etc. Tambi?n se pueden enviar mensajes no urgentes a su proveedor. ?Para obtener m?s informaci?n sobre lo que [employer] con MyChart, vaya a [email or web address]. ? ?Su pr?xima cita: ?12 meses) ? ?El formato para su pr?xima cita: ?En persona ? ?Proveedor: ?Dr. Sunny Schlein Revankar ? ? ?Otras instrucciones ?N / A ? ? ? ? ? ?Medication Instructions:  ?Your physician recommends that you  continue on your current medications as directed. Please refer to the Current Medication list given to you today.  ?*If you need a refill on your cardiac medications before your next appointment, please call your pharmacy* ? ? ?Lab Work: ?None ordered ?If you have labs (blood work) drawn today and your tests are completely normal, you will receive your results only by: ?MyChart Message (if you have MyChart) OR ?A paper copy in the mail ?If you have any lab test that is abnormal or we need to change your treatment, we will call you to review the results. ? ? ?Testing/Procedures: ?None ordered ? ? ?Follow-Up: ?At [facility], you and your health needs are our priority.  As part of our continuing mission to provide you with exceptional heart care, we have created designated Provider Care Teams.  These Care Teams include your primary Cardiologist (physician) and Advanced Practice Providers (APPs -  Physician Assistants and Nurse Practitioners) who all work together to provide you with the care you need, when you need it. ? ?We recommend signing up for the patient portal called "MyChart".  Sign up information is provided on this After Visit Summary.  MyChart is used to connect with patients for Virtual Visits (Telemedicine).  Patients are able to view lab/test results, encounter notes, upcoming appointments, etc.  Non-urgent messages can be sent to your provider as well.   ?To learn more about what you can do with MyChart, go to [email or web address].   ? ?Your next appointment:   ?12 month(s) ? ?The format for your next appointment:   ?In Person ? ?Provider:   ?Jyl Heinz, MD ? ? ?Other Instructions ?NA ? ?

## 2021-07-04 NOTE — Progress Notes (Signed)
?Cardiology Office Note:   ? ?Date:  07/04/2021  ? ?ID:  Janet Peters, DOB 21-Mar-1951, MRN [id number] ? ?PCP:  Mackie Pai, PA-C  ?Cardiologist:  Jenean Lindau, MD  ? ?Referring MD: Mackie Pai, PA-C  ? ? ?ASSESSMENT:   ? ?1. Mixed hyperlipidemia   ?2. Diabetes mellitus due to underlying condition with unspecified complications ([place])   ?3. Aortic calcification (HCC)   ?4. Obesity (BMI 30.0-34.9)   ? ?PLAN:   ? ?In order of problems listed above: ? ?Aortic calcification: Secondary prevention stressed to the patient.  Importance of compliance with diet medication stressed and she vocalized understanding.  She has excellent exercise program anticoagulation related her about this. ?Essential hypertension: Blood pressure stable and it was emphasized.  Lifestyle modification urged. ?Mixed dyslipidemia: On statin therapy followed by primary care.  Lipids reviewed and discussed with her at length. ?Diabetes mellitus and obesity: Risks explained and lifestyle modification urged and she promises to do better. ?Patient will be seen in follow-up appointment in 12 months or earlier if the patient has any concerns ? ? ? ?Medication Adjustments/Labs and Tests Ordered: ?Current medicines are reviewed at length with the patient today.  Concerns regarding medicines are outlined above.  ?No orders of the defined types were placed in this encounter. ? ?No orders of the defined types were placed in this encounter. ? ? ? ?No chief complaint on file. ?  ? ?History of Present Illness:   ? ?Janet Peters is a 71 y.o. female.  She has past medical history of aortic atherosclerosis, essential hypertension and mixed dyslipidemia.  She denies any problems at this time and takes care of activities of daily living.  She walks at least an hour on a daily basis.  No chest pain orthopnea or PND.  At the time of my evaluation, the patient is alert awake oriented and in no distress. ? ?Past Medical History:  ?Diagnosis Date   ? Allergic rhinitis due to pollen 02/23/2021  ? Allergy   ? seasonal allergies  ? Anxiety   ? situational   ? Arthritis   ? bilateral knees  ? 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  ? Bradycardia   ? per pt report  ? Chest discomfort 11/19/2019  ? Depression   ? situational   ? Diabetes mellitus due to underlying condition with unspecified complications ([place]) 03/03/7866  ? Diabetes mellitus without complication ([place])   ? on meds  ? Hyperlipidemia   ? on meds  ? Mixed dyslipidemia 01/01/2018  ? Osteopenia 10/2016  ? T score -1.3 FRAX 3.5%/0.1%  ? Rash and other nonspecific skin eruption 06/28/2021  ? 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  ? ? ?Past Surgical History:  ?Procedure Laterality Date  ? CATARACT EXTRACTION Left   ? CESAREAN SECTION    ? x2  ? CHOLECYSTECTOMY, LAPAROSCOPIC  2010  ? KNEE ARTHROSCOPY W/ MENISCAL REPAIR Left 2011  ? PARTIAL HYSTERECTOMY  1988  ? ? ?Current Medications: ?Current Meds  ?Medication Sig  ? Cholecalciferol (VITAMIN D3) 125 MCG (5000 UT) CAPS Take 5,000 Units by mouth daily.  ? famotidine (PEPCID) 20 MG tablet Take 1 tablet (20 mg total) by mouth daily.  ? fluticasone (FLONASE) 50 MCG/ACT nasal spray SHAKE LIQUID AND USE 2 SPRAYS IN EACH NOSTRIL DAILY  ? glucose blood (ACCU-CHEK GUIDE) test strip USE TO TEST FOUR TIMES DAILY AS DIRECTED  ? losartan (COZAAR) 50 MG tablet TAKE 1 TABLET(50 MG) BY MOUTH DAILY  ?  MAGNESIUM PO Take 1 tablet by mouth daily.  ? metFORMIN (GLUCOPHAGE) 500 MG tablet Take 1 tablet (500 mg total) by mouth 2 (two) times daily with a meal.  ?  ? ?Allergies:   Patient has no known allergies.  ? ?Social History  ? ?Socioeconomic History  ? Marital status: Married  ?  Spouse name: Not on file  ? Number of children: Not on file  ? Years of education: Not on file  ? Highest education level: Not on file  ?Occupational History  ? Not on file  ?Tobacco Use  ? Smoking status: Never  ? Smokeless tobacco: Never  ?Vaping Use  ? Vaping Use: Never used   ?Substance and Sexual Activity  ? Alcohol use: No  ? Drug use: No  ? Sexual activity: Yes  ?  Partners: Male  ?  Birth control/protection: Surgical  ?  Comment: 1st intercourse- 23, partners - 1, married- 37 yrs, hysterectomy  ?Other Topics Concern  ? Not on file  ?Social History Narrative  ? Not on file  ? ?Social Determinants of Health  ? ?Financial Resource Strain: Not on file  ?Food Insecurity: Not on file  ?Transportation Needs: Not on file  ?Physical Activity: Not on file  ?Stress: Not on file  ?Social Connections: Not on file  ?  ? ?Family History: ?The patient's family history includes Heart attack in her maternal grandmother. There is no history of Colon polyps, Colon cancer, Esophageal cancer, Stomach cancer, or Rectal cancer. ? ?ROS:   ?Please see the history of present illness.    ?All other systems reviewed and are negative. ? ?EKGs/Labs/Other Studies Reviewed:   ? ?The following studies were reviewed today: ?I discussed CT calcium scoring report with her at length.  Her calcium score is 0 and she has evidence of calcification. ? ? ?Recent Labs: ?05/17/2021: ALT 18; BUN 16; Creatinine, Ser 0.66; Hemoglobin 13.3; Platelets 344.0; Potassium 4.0; Sodium 140  ?Recent Lipid Panel ?   ?Component Value Date/Time  ? CHOL 169 01/21/2021 0901  ? CHOL 174 05/28/2019 0926  ? TRIG 219.0 (H) 01/21/2021 0901  ? HDL 50.30 01/21/2021 0901  ? HDL 53 05/28/2019 0926  ? CHOLHDL 3 01/21/2021 0901  ? VLDL 43.8 (H) 01/21/2021 0901  ? [place] 76 09/10/2020 0846  ? LDLCALC 84 05/28/2019 0926  ? LDLDIRECT 92.0 01/21/2021 0901  ? ? ?Physical Exam:   ? ?VS:  BP (!) 146/76   Pulse 100   Ht '5\' 2"'$  (1.575 m)   Wt 179 lb (81.2 kg)   SpO2 98%   BMI 32.74 kg/m?    ? ?Wt Readings from Last 3 Encounters:  ?07/04/21 179 lb (81.2 kg)  ?06/01/21 174 lb 9.6 oz (79.2 kg)  ?05/17/21 176 lb (79.8 kg)  ?  ? ?GEN: Patient is in no acute distress ?HEENT: Normal ?NECK: No JVD; No carotid bruits ?LYMPHATICS: No lymphadenopathy ?CARDIAC: Hear  sounds regular, 2/6 systolic murmur at the apex. ?RESPIRATORY:  Clear to auscultation without rales, wheezing or rhonchi  ?ABDOMEN: Soft, non-tender, non-distended ?MUSCULOSKELETAL:  No edema; No deformity  ?SKIN: Warm and dry ?NEUROLOGIC:  Alert and oriented x 3 ?PSYCHIATRIC:  Normal affect  ? ?Signed, ?Jenean Lindau, MD  ?07/04/2021 10:43 AM    ?[place]  ?

## 2021-08-12 ENCOUNTER — Other Ambulatory Visit: Payer: Self-pay

## 2021-08-12 ENCOUNTER — Other Ambulatory Visit: Payer: Self-pay | Admitting: Medical

## 2021-08-12 ENCOUNTER — Telehealth: Payer: Self-pay

## 2021-08-12 MED ORDER — LOSARTAN POTASSIUM 50 MG PO TABS: ORAL_TABLET | ORAL | 0 refills | Status: DC

## 2021-08-12 NOTE — Telephone Encounter (Signed)
Opened to resend medication

## 2021-09-13 ENCOUNTER — Other Ambulatory Visit: Payer: Self-pay | Admitting: Medical

## 2021-09-21 ENCOUNTER — Ambulatory Visit (INDEPENDENT_AMBULATORY_CARE_PROVIDER_SITE_OTHER): Payer: Medicare Other | Admitting: Medical

## 2021-09-21 VITALS — BP 134/55 | HR 97 | Temp 98.5°F | Resp 18 | Ht 62.0 in | Wt 179.0 lb

## 2021-09-21 DIAGNOSIS — E118 Type 2 diabetes mellitus with unspecified complications: Secondary | ICD-10-CM | POA: Diagnosis not present

## 2021-09-21 DIAGNOSIS — R1011 Right upper quadrant pain: Secondary | ICD-10-CM

## 2021-09-21 DIAGNOSIS — I10 Essential (primary) hypertension: Secondary | ICD-10-CM

## 2021-09-21 DIAGNOSIS — Z794 Long term (current) use of insulin: Secondary | ICD-10-CM

## 2021-09-21 DIAGNOSIS — R1013 Epigastric pain: Secondary | ICD-10-CM

## 2021-09-21 DIAGNOSIS — E7849 Other hyperlipidemia: Secondary | ICD-10-CM

## 2021-09-21 LAB — COMPREHENSIVE METABOLIC PANEL
ALT: 22 U/L (ref 0–35)
AST: 19 U/L (ref 0–37)
Albumin: 4.4 g/dL (ref 3.5–5.2)
Alkaline Phosphatase: 102 U/L (ref 39–117)
BUN: 11 mg/dL (ref 6–23)
CO2: 27 mEq/L (ref 19–32)
Calcium: 9.7 mg/dL (ref 8.4–10.5)
Chloride: 103 mEq/L (ref 96–112)
Creatinine, Ser: 0.64 mg/dL (ref 0.40–1.20)
GFR: 89.1 mL/min (ref >60.00)
Glucose, Bld: 118 mg/dL — ABNORMAL HIGH (ref 70–99)
Potassium: 4.3 mEq/L (ref 3.5–5.1)
Sodium: 140 mEq/L (ref 135–145)
Total Bilirubin: 0.4 mg/dL (ref 0.2–1.2)
Total Protein: 7.4 g/dL (ref 6.0–8.3)

## 2021-09-21 LAB — HEMOGLOBIN A1C: Hgb A1c MFr Bld: 7.1 % — ABNORMAL HIGH (ref 4.6–6.5)

## 2021-09-21 LAB — LIPASE: Lipase: 19 U/L (ref 11.0–59.0)

## 2021-09-21 MED ORDER — OMEPRAZOLE 40 MG PO CPDR: 40.0000 mg | DELAYED_RELEASE_CAPSULE | Freq: Every day | ORAL | 3 refills | Status: DC

## 2021-09-21 NOTE — Progress Notes (Signed)
Subjective:    Patient ID: Janet Peters, female    DOB: 1950-12-21, 71 y.o.   MRN: [id number]  HPI  Pt in for follow up. Since last visit with me she saw cardiologist. Below A/P in ".  " Aortic calcification: Secondary prevention stressed to the patient.  Importance of compliance with diet medication stressed and she vocalized understanding.  She has excellent exercise program anticoagulation related her about this. Essential hypertension: Blood pressure stable and it was emphasized.  Lifestyle modification urged. Mixed dyslipidemia: On statin therapy followed by primary care.  Lipids reviewed and discussed with her at length. Diabetes mellitus and obesity: Risks explained and lifestyle modification urged and she promises to do better."    Pt in with rt upper quadrant area pain recently. Pt has hx of prior cholecystectomy. Pain is mild and intermittent.  Also some epigastric pain that is mild and she full/bloated. Full with minimal foods. 4 months ago she had negative h pylori. In April lipase was normal.   Back in February rx'd famotadine and pt states did not help.    Reviewed colonoscopy today. No significant finding when done in 2022.  Pt is about to go to [place] in one week.  4 months ago a1c was 6.8. Metformin 500 mg bid.  Htn- bp well controlled. On losartan 50 mg daily  Hyperlipidemia- on lipitor 10 mg daily.     Review of Systems  Constitutional:  Negative for chills, fatigue and fever.  Respiratory:  Negative for cough, chest tightness, shortness of breath and wheezing.   Cardiovascular:  Negative for chest pain and palpitations.  Gastrointestinal:  Positive for abdominal pain. Negative for blood in stool, constipation, diarrhea, nausea and vomiting.  Genitourinary:  Negative for dysuria, flank pain and frequency.  Musculoskeletal:  Negative for back pain and joint swelling.  Skin:  Negative for rash.  Neurological:  Negative for dizziness, seizures,  syncope, weakness and light-headedness.  Hematological:  Negative for adenopathy. Does not bruise/bleed easily.   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   Bradycardia    per pt report   Chest discomfort 11/19/2019   Depression    situational    Diabetes mellitus due to underlying condition with unspecified complications ([place]) 57/0/1779   Diabetes mellitus without complication ([place])    on meds   Hyperlipidemia    on meds   Mixed dyslipidemia 01/01/2018   Osteopenia 10/2016   T score -1.3 FRAX 3.5%/0.1%   Rash and other nonspecific skin eruption 06/28/2021   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     Social History   Socioeconomic History   Marital status: Married    Spouse name: Not on file   Number of children: Not on file   Years of education: Not on file   Highest education level: Not on file  Occupational History   Not on file  Tobacco Use   Smoking status: Never   Smokeless tobacco: Never  Vaping Use   Vaping Use: Never used  Substance and Sexual Activity   Alcohol use: No   Drug use: No   Sexual activity: Yes    Partners: Male    Birth control/protection: Surgical    Comment: 1st intercourse- 23, partners - 2, married- 26 yrs,  hysterectomy  Other Topics Concern   Not on file  Social History Narrative   Not on file   Social Determinants of Health   Financial Resource Strain: Not on file  Food Insecurity: Not on file  Transportation Needs: Not on file  Physical Activity: Not on file  Stress: Not on file  Social Connections: Not on file  Intimate Partner Violence: Not on file    Past Surgical History:  Procedure Laterality Date   CATARACT EXTRACTION Left    CESAREAN SECTION     x2   CHOLECYSTECTOMY, LAPAROSCOPIC  2010   KNEE ARTHROSCOPY W/ MENISCAL  REPAIR Left 2011   PARTIAL HYSTERECTOMY  1988    Family History  Problem Relation Age of Onset   Heart attack Maternal Grandmother    Colon polyps Neg Hx    Colon cancer Neg Hx    Esophageal cancer Neg Hx    Stomach cancer Neg Hx    Rectal cancer Neg Hx     No Known Allergies  Current Outpatient Medications on File Prior to Visit  Medication Sig Dispense Refill   atorvastatin (LIPITOR) 10 MG tablet Take 1 tablet (10 mg total) by mouth daily. 90 tablet 3   Cholecalciferol (VITAMIN D3) 125 MCG (5000 UT) CAPS Take 5,000 Units by mouth daily.     famotidine (PEPCID) 20 MG tablet Take 1 tablet (20 mg total) by mouth daily. 30 tablet 3   fluticasone (FLONASE) 50 MCG/ACT nasal spray SHAKE LIQUID AND USE 2 SPRAYS IN EACH NOSTRIL DAILY 48 g 0   glucose blood (ACCU-CHEK GUIDE) test strip USE TO TEST FOUR TIMES DAILY AS DIRECTED 200 strip 12   losartan (COZAAR) 50 MG tablet TAKE 1 TABLET(50 MG) BY MOUTH DAILY 90 tablet 0   MAGNESIUM PO Take 1 tablet by mouth daily.     metFORMIN (GLUCOPHAGE) 500 MG tablet Take 1 tablet (500 mg total) by mouth 2 (two) times daily with a meal. 180 tablet 3   No current facility-administered medications on file prior to visit.    BP (!) 134/55   Pulse 97   Temp 98.5 F (36.9 C)   Resp 18   Ht '5\' 2"'$  (1.575 m)   Wt 179 lb (81.2 kg)   SpO2 99%   BMI 32.74 kg/m       Objective:   Physical Exam  General Mental Status- Alert. General Appearance- Not in acute distress.   Skin General: Color- Normal Color. Moisture- Normal Moisture.    Chest and Lung Exam Auscultation: Breath Sounds:-Normal.  Cardiovascular Auscultation:Rythm- Regular. Murmurs & Other Heart Sounds:Auscultation of the heart reveals- No Murmurs.  Abdomen Inspection:-Inspeection Normal. Palpation/Percussion:Note:No mass. Palpation and Percussion of the abdomen reveal- Non Tender, Non Distended + BS, no rebound or guarding.   Neurologic Cranial Nerve exam:- CN III-XII  intact(No nystagmus), symmetric smile. Strength:- 5/5 equal and symmetric strength both upper and lower extremities.       Assessment & Plan:   Patient Instructions  For epigastric and some rt upper quadrant pain. Will treat with omeprazole for possible GERD. Considering that fatty liver playing a role in rt upper quadrant pain since no galbladder present/prior surgery. If pain persists recommend referral to GI MD.  For diabetes cmp and a1c. Continue metformin.  Htn- bp well controlled. Continue losartan.  For high cholesterol continue atorvastatin.  Follow up in 3 months or sooner if needed.   Send me my chart up date on how you are doing with epigastric pain in  2 weeks. Can at that point refer to GI MD if needed.     Mackie Pai, PA-C

## 2021-09-21 NOTE — Patient Instructions (Addendum)
For epigastric and some rt upper quadrant pain. Will treat with omeprazole for possible GERD. Considering that fatty liver playing a role in rt upper quadrant pain since no galbladder present/prior surgery. If pain persists recommend referral to GI MD.  For diabetes cmp and a1c. Continue metformin.  Htn- bp well controlled. Continue losartan.  For high cholesterol continue atorvastatin.  Follow up in 3 months or sooner if needed.   Send me my chart up date on how you are doing with epigastric pain in 2 weeks. Can at that point refer to GI MD if needed.

## 2021-11-09 ENCOUNTER — Encounter: Payer: Self-pay | Admitting: Obstetrics & Gynecology

## 2021-11-09 ENCOUNTER — Ambulatory Visit (INDEPENDENT_AMBULATORY_CARE_PROVIDER_SITE_OTHER): Payer: Medicare Other | Admitting: Obstetrics & Gynecology

## 2021-11-09 VITALS — BP 122/80 | Ht 60.0 in | Wt 182.0 lb

## 2021-11-09 DIAGNOSIS — Z01419 Encounter for gynecological examination (general) (routine) without abnormal findings: Secondary | ICD-10-CM | POA: Diagnosis not present

## 2021-11-09 DIAGNOSIS — Z90710 Acquired absence of both cervix and uterus: Secondary | ICD-10-CM

## 2021-11-09 DIAGNOSIS — Z780 Asymptomatic menopausal state: Secondary | ICD-10-CM

## 2021-11-09 DIAGNOSIS — M8588 Other specified disorders of bone density and structure, other site: Secondary | ICD-10-CM

## 2021-11-09 NOTE — Progress Notes (Signed)
Janet Peters [birth year] [id number]   History:    71 y.o. [place] Married.  From [place], mother passed away from [address] in 06/22/19.  Daughter moved here from [place] 75 yo.  Son in [place].   RP:  Established patient presenting for annual gyn exam    HPI: S/P Total Hysterectomy.  Postmenopause, well on no HRT.  No pelvic pain.  No pain with IC.  Pap Neg in June 21, 2017.  No indication to repeat a Pap test at this time. Urine/BMs normal.  Breasts normal.  Mammo Neg 06/2021.  BMI 35.54. Walking regularly.  Health labs with Fam MD.  BD 11/2018 Very mild Osteopenia with T-Score -1.1 at the AP Spine.  All other sites normal.  Scheduled for a repeat BD in 12/2021. [place] 03/2020.   Past medical history,surgical history, family history and social history were all reviewed and documented in the EPIC chart.  Gynecologic History No LMP recorded. Patient has had a hysterectomy.  Obstetric History OB History  Gravida Para Term Preterm AB Living  '2 2       2  '$ SAB IAB Ectopic Multiple Live Births               # Outcome Date GA Lbr Len/2nd Weight Sex Delivery Anes PTL Lv  2 Para           1 Para              ROS: A ROS was performed and pertinent positives and negatives are included in the history.  GENERAL: No fevers or chills. HEENT: No change in vision, no earache, sore throat or sinus congestion. NECK: No pain or stiffness. CARDIOVASCULAR: No chest pain or pressure. No palpitations. PULMONARY: No shortness of breath, cough or wheeze. GASTROINTESTINAL: No abdominal pain, nausea, vomiting or diarrhea, melena or bright red blood per rectum. GENITOURINARY: No urinary frequency, urgency, hesitancy or dysuria. MUSCULOSKELETAL: No joint or muscle pain, no back pain, no recent trauma. DERMATOLOGIC: No rash, no itching, no lesions. ENDOCRINE: No polyuria, polydipsia, no heat or cold intolerance. No recent change in weight. HEMATOLOGICAL: No anemia or easy bruising or bleeding. NEUROLOGIC: No headache,  seizures, numbness, tingling or weakness. PSYCHIATRIC: No depression, no loss of interest in normal activity or change in sleep pattern.     Exam:   BP 122/80 (BP Location: Right Arm, Patient Position: Sitting, Cuff Size: Normal)   Ht 5' (1.524 m)   Wt 182 lb (82.6 kg)   BMI 35.54 kg/m   Body mass index is 35.54 kg/m.  General appearance : Well developed well nourished female. No acute distress HEENT: Eyes: no retinal hemorrhage or exudates,  Neck supple, trachea midline, no carotid bruits, no thyroidmegaly Lungs: Clear to auscultation, no rhonchi or wheezes, or rib retractions  Heart: Regular rate and rhythm, no murmurs or gallops Breast:Examined in sitting and supine position were symmetrical in appearance, no palpable masses or tenderness,  no skin retraction, no nipple inversion, no nipple discharge, no skin discoloration, no axillary or supraclavicular lymphadenopathy Abdomen: no palpable masses or tenderness, no rebound or guarding Extremities: no edema or skin discoloration or tenderness  Pelvic: Vulva: Normal             Vagina: No gross lesions or discharge  Cervix/Uterus absent  Adnexa  Without masses or tenderness  Anus: Normal   Assessment/Plan:  71 y.o. female for annual exam   1. Well female exam with routine gynecological exam S/P Total Hysterectomy.  Postmenopause, well on  no HRT.  No pelvic pain.  No pain with IC.  Pap Neg in 2019.  No indication to repeat a Pap test at this time. Urine/BMs normal.  Breasts normal.  Mammo Neg 06/2021.  BMI 35.54. Walking regularly.  Health labs with Fam MD.  BD 11/2018 Very mild Osteopenia with T-Score -1.1 at the AP Spine.  All other sites normal.  Scheduled for a repeat BD in 12/2021. [place] 03/2020.  2. Hx of total hysterectomy  3. Postmenopause  Postmenopause, well on no HRT.  No pelvic pain.  No pain with IC.   4. Osteopenia of lumbar spine  BD 11/2018 Very mild Osteopenia with T-Score -1.1 at the AP Spine.  All other sites  normal.  Scheduled for a repeat BD in 12/2021.   Princess Bruins MD, 8:17 AM 11/09/2021

## 2021-12-28 ENCOUNTER — Encounter: Payer: Self-pay | Admitting: Medical

## 2021-12-28 ENCOUNTER — Ambulatory Visit (INDEPENDENT_AMBULATORY_CARE_PROVIDER_SITE_OTHER): Payer: Medicare Other | Admitting: Medical

## 2021-12-28 VITALS — BP 135/79 | HR 98 | Temp 98.2°F | Resp 18 | Ht 60.0 in | Wt 179.6 lb

## 2021-12-28 DIAGNOSIS — Z23 Encounter for immunization: Secondary | ICD-10-CM

## 2021-12-28 DIAGNOSIS — R1011 Right upper quadrant pain: Secondary | ICD-10-CM | POA: Diagnosis not present

## 2021-12-28 DIAGNOSIS — I10 Essential (primary) hypertension: Secondary | ICD-10-CM | POA: Diagnosis not present

## 2021-12-28 DIAGNOSIS — E7849 Other hyperlipidemia: Secondary | ICD-10-CM

## 2021-12-28 DIAGNOSIS — E118 Type 2 diabetes mellitus with unspecified complications: Secondary | ICD-10-CM | POA: Diagnosis not present

## 2021-12-28 DIAGNOSIS — F419 Anxiety disorder, unspecified: Secondary | ICD-10-CM

## 2021-12-28 DIAGNOSIS — Z794 Long term (current) use of insulin: Secondary | ICD-10-CM

## 2021-12-28 LAB — COMPREHENSIVE METABOLIC PANEL
ALT: 23 U/L (ref 0–35)
AST: 17 U/L (ref 0–37)
Albumin: 4.3 g/dL (ref 3.5–5.2)
Alkaline Phosphatase: 98 U/L (ref 39–117)
BUN: 14 mg/dL (ref 6–23)
CO2: 25 mEq/L (ref 19–32)
Calcium: 9.4 mg/dL (ref 8.4–10.5)
Chloride: 105 mEq/L (ref 96–112)
Creatinine, Ser: 0.67 mg/dL (ref 0.40–1.20)
GFR: 87.96 mL/min (ref >60.00)
Glucose, Bld: 103 mg/dL — ABNORMAL HIGH (ref 70–99)
Potassium: 4.3 mEq/L (ref 3.5–5.1)
Sodium: 140 mEq/L (ref 135–145)
Total Bilirubin: 0.6 mg/dL (ref 0.2–1.2)
Total Protein: 7.2 g/dL (ref 6.0–8.3)

## 2021-12-28 LAB — LIPID PANEL
Cholesterol: 150 mg/dL (ref 0–200)
HDL: 49.8 mg/dL (ref >39.00)
LDL Cholesterol: 62 mg/dL (ref 0–99)
NonHDL: 99.8
Total CHOL/HDL Ratio: 3
Triglycerides: 190 mg/dL — ABNORMAL HIGH (ref 0.0–149.0)
VLDL: 38 mg/dL (ref 0.0–40.0)

## 2021-12-28 LAB — HEMOGLOBIN A1C: Hgb A1c MFr Bld: 7.2 % — ABNORMAL HIGH (ref 4.6–6.5)

## 2021-12-28 MED ORDER — CYCLOBENZAPRINE HCL 5 MG PO TABS: ORAL_TABLET | ORAL | 0 refills | Status: DC

## 2021-12-28 NOTE — Addendum Note (Signed)
Addended by: Jeronimo Greaves on: 12/28/2021 09:33 AM   Modules accepted: Orders

## 2021-12-28 NOTE — Patient Instructions (Signed)
For diabetes cmp and a1c. Continue metformin.   Htn- bp well controlled. Continue losartan.   For high cholesterol continue atorvastatin.  For fatty liver and rare occasional ruq will order ruq [place].  History of described tension type ha. If bp well controlled can use ibuprofen 200-400 mg every 8 hours. Also rx low number low dose flexeril to use as needed.   Follow up in 3 months or sooner if needed.

## 2021-12-28 NOTE — Progress Notes (Signed)
Subjective:    Patient ID: Janet Peters, female    DOB: April 06, 1950, 71 y.o.   MRN: [id number]  HPI Pt in for follow up.  Pt states her stomach feels better. Overall no pain except very rare intermittent brief rt upper pain. With omeprazole pain resolved most of time.  On review of prior US known diffuse hepatic steatosis.   For diabetes cmp and a1c done last visit. Continue metformin. Last a1c was 7.1.    Htn- bp well controlled. Continue losartan.   For high cholesterol continue atorvastatin.  Pt still reporting still having occasional head with trapezius pain. Pt when she has used flexeril will stop headache. No gross motor or sensory function deficits. She notes will have with some increased stress. Most of time rare HA.  Review of Systems  Constitutional:  Negative for chills, fatigue and fever.  HENT:  Negative for congestion, ear discharge and ear pain.   Respiratory:  Negative for cough, chest tightness, shortness of breath and wheezing.   Cardiovascular:  Negative for chest pain and palpitations.  Gastrointestinal:  Negative for abdominal pain.       See hpi.  Genitourinary:  Negative for dysuria.  Musculoskeletal:  Negative for back pain, joint swelling and neck pain.  Skin:  Negative for rash.  Neurological:  Negative for syncope, facial asymmetry, weakness and headaches.  Hematological:  Negative for adenopathy. Does not bruise/bleed easily.  Psychiatric/Behavioral:  Negative for behavioral problems and confusion.    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   Bradycardia    per pt report   Chest discomfort 11/19/2019   Depression    situational    Diabetes mellitus due to underlying condition with unspecified complications ([place]) 10/30/7617   Diabetes mellitus without  complication ([place])    on meds   Hyperlipidemia    on meds   Mixed dyslipidemia 01/01/2018   Osteopenia 10/2016   T score -1.3 FRAX 3.5%/0.1%   Rash and other nonspecific skin eruption 06/28/2021   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     Social History   Socioeconomic History   Marital status: Married    Spouse name: Not on file   Number of children: Not on file   Years of education: Not on file   Highest education level: Not on file  Occupational History   Not on file  Tobacco Use   Smoking status: Never   Smokeless tobacco: Never  Vaping Use   Vaping Use: Never used  Substance and Sexual Activity   Alcohol use: No   Drug use: No   Sexual activity: Yes    Partners: Male    Birth control/protection: Surgical    Comment: 1st intercourse- 23, partners - 2, married- 44 yrs, hysterectomy  Other Topics Concern   Not on file  Social History Narrative   Not on file   Social Determinants of Health   Financial Resource Strain: Not on file  Food Insecurity: Not on file  Transportation Needs: Not on file  Physical Activity: Not on file  Stress: Not on file  Social Connections: Not on file  Intimate Partner Violence: Not on file    Past Surgical History:  Procedure Laterality Date   CATARACT EXTRACTION Left  CESAREAN SECTION     x2   CHOLECYSTECTOMY, LAPAROSCOPIC  2010   KNEE ARTHROSCOPY W/ MENISCAL REPAIR Left 2011   PARTIAL HYSTERECTOMY  1988    Family History  Problem Relation Age of Onset   Heart attack Maternal Grandmother    Colon polyps Neg Hx    Colon cancer Neg Hx    Esophageal cancer Neg Hx    Stomach cancer Neg Hx    Rectal cancer Neg Hx     No Known Allergies  Current Outpatient Medications on File Prior to Visit  Medication Sig Dispense Refill   Cholecalciferol (VITAMIN D3) 125 MCG (5000 UT) CAPS Take 5,000 Units by mouth daily.     fluticasone (FLONASE) 50 MCG/ACT nasal spray SHAKE LIQUID AND USE 2 SPRAYS IN EACH NOSTRIL DAILY  48 g 0   glucose blood (ACCU-CHEK GUIDE) test strip USE TO TEST FOUR TIMES DAILY AS DIRECTED 200 strip 12   losartan (COZAAR) 50 MG tablet TAKE 1 TABLET(50 MG) BY MOUTH DAILY 90 tablet 0   MAGNESIUM PO Take 1 tablet by mouth daily.     metFORMIN (GLUCOPHAGE) 500 MG tablet Take 1 tablet (500 mg total) by mouth 2 (two) times daily with a meal. 180 tablet 3   atorvastatin (LIPITOR) 10 MG tablet Take 1 tablet (10 mg total) by mouth daily. 90 tablet 3   No current facility-administered medications on file prior to visit.    BP (!) 140/60   Pulse 98   Temp 98.2 F (36.8 C)   Resp 18   Ht 5' (1.524 m)   Wt 179 lb 9.6 oz (81.5 kg)   SpO2 100%   BMI 35.08 kg/m    135/79 second check.        Objective:   Physical Exam  General Mental Status- Alert. General Appearance- Not in acute distress.   Skin General: Color- Normal Color. Moisture- Normal Moisture.  Neck Carotid Arteries- Normal color. Moisture- Normal Moisture. No carotid bruits. No JVD.  Chest and Lung Exam Auscultation: Breath Sounds:-Normal.  Cardiovascular Auscultation:Rythm- Regular. Murmurs & Other Heart Sounds:Auscultation of the heart reveals- No Murmurs.  Abdomen Inspection:-Inspeection Normal. Palpation/Percussion:Note:No mass. Palpation and Percussion of the abdomen reveal- Non Tender, Non Distended + BS, no rebound or guarding.   Neurologic Cranial Nerve exam:- CN III-XII intact(No nystagmus), symmetric smile. Strength:- 5/5 equal and symmetric strength both upper and lower extremities.       Assessment & Plan:  For diabetes cmp and a1c. Continue metformin.   Htn- bp well controlled. Continue losartan.   For high cholesterol continue atorvastatin.  For fatty liver and rare occasional ruq will order ruq [place].  History of described tension type ha. If bp well controlled can use ibuprofen 200-400 mg every 8 hours. Also rx low number low dose flexeril to use as needed.   Follow up in 3 months or  sooner if needed.

## 2021-12-29 ENCOUNTER — Ambulatory Visit (HOSPITAL_BASED_OUTPATIENT_CLINIC_OR_DEPARTMENT_OTHER)
Admission: RE | Admit: 2021-12-29 | Discharge: 2021-12-29 | Disposition: A | Discharge status: Home / Self Care | Payer: Medicare Other | Source: Ambulatory Visit | Attending: Medical | Admitting: Medical

## 2021-12-29 DIAGNOSIS — R1011 Right upper quadrant pain: Secondary | ICD-10-CM | POA: Diagnosis present

## 2022-01-02 ENCOUNTER — Other Ambulatory Visit: Payer: Self-pay | Admitting: *Deleted

## 2022-01-02 DIAGNOSIS — M8588 Other specified disorders of bone density and structure, other site: Secondary | ICD-10-CM

## 2022-01-03 ENCOUNTER — Ambulatory Visit (INDEPENDENT_AMBULATORY_CARE_PROVIDER_SITE_OTHER): Payer: Medicare Other

## 2022-01-03 ENCOUNTER — Other Ambulatory Visit: Payer: Self-pay | Admitting: Obstetrics & Gynecology

## 2022-01-03 DIAGNOSIS — Z13820 Encounter for screening for osteoporosis: Secondary | ICD-10-CM

## 2022-01-03 DIAGNOSIS — M8588 Other specified disorders of bone density and structure, other site: Secondary | ICD-10-CM

## 2022-01-03 DIAGNOSIS — Z780 Asymptomatic menopausal state: Secondary | ICD-10-CM

## 2022-01-11 ENCOUNTER — Ambulatory Visit: Payer: Medicare Other | Admitting: Medical

## 2022-02-14 ENCOUNTER — Encounter: Payer: Self-pay | Admitting: Medical

## 2022-02-14 ENCOUNTER — Ambulatory Visit (INDEPENDENT_AMBULATORY_CARE_PROVIDER_SITE_OTHER): Payer: Medicare Other | Admitting: Medical

## 2022-02-14 VITALS — BP 120/77 | HR 100 | Temp 98.5°F | Resp 18 | Ht 60.0 in | Wt 180.0 lb

## 2022-02-14 DIAGNOSIS — D122 Benign neoplasm of ascending colon: Secondary | ICD-10-CM

## 2022-02-14 DIAGNOSIS — K760 Fatty (change of) liver, not elsewhere classified: Secondary | ICD-10-CM | POA: Diagnosis not present

## 2022-02-14 DIAGNOSIS — R1011 Right upper quadrant pain: Secondary | ICD-10-CM

## 2022-02-14 NOTE — Patient Instructions (Addendum)
Your  chronic rt upper quadrant pain and hx of fatty liver. This may be the cause. Normal lft. US done in recent past. Can use low dose tylenol 500 mg every 8 hours and can add on ibuprofen 200 mg if needed every 8 hours.  Will get cbc, cmp and lipase today. If you pain worsens let me know and will get imaging studies. If severe pain be seen in ED.  Did place referral to your GI MD. For RUQ pain, hx of fatty liver and colon polyp. Hopefully they have cancellation and can see you sooner.  Htn- bp better at home.   Follow up in 6-8 weeks or sooner if needed(can update me by my chart as well)

## 2022-02-14 NOTE — Progress Notes (Signed)
Subjective:    Patient ID: Janet Peters, female    DOB: 1950-08-11, 71 y.o.   MRN: [id number]  HPI   Pt in with some rt upper quadrant pain and rt lower rib pain for about one week. Some pain at time to lower rt side back miniamlly but not constant and not presently.  Pain level 4-5/10. Paint has been low level constant but will increase randomly at times.    Pt has hx of gallbadder removed. [place] in   [place]: 1. Prior cholecystectomy. 2. Increased hepatic parenchymal echogenicity suggestive of steatosis.    On: 12/29/2021 09:52 Electronically Signed   By: Lovey Newcomer M.D.  Last vist her lipase was normal.  Pt not have any nausea, no vomting, no diarrhea and no constipation.   Saw pt 12-29-2019. Pt is stating pain is more than prior.  Pt has had colonoscopy in 2022.  Prior colonscopy 04-15-2020 showed below.   One 7 mm polyp in the ascending colon, removed with a cold snare. Resected and retrieved. - Two 4 to 5 mm polyps in the transverse colon, removed with a cold snare. Resected and retrieved. - Diverticulosis in the sigmoid colon and in the distal descending colon. - Internal hemorrhoids.      Review of Systems  Constitutional:  Negative for chills, fatigue and fever.  HENT:  Negative for congestion, ear pain, hearing loss, mouth sores and nosebleeds.   Respiratory:  Negative for chest tightness, shortness of breath and wheezing.   Cardiovascular:  Negative for chest pain and palpitations.  Genitourinary:  Negative for difficulty urinating, dysuria, flank pain, frequency, hematuria and urgency.  Musculoskeletal:  Negative for back pain and myalgias.  Skin:  Negative for rash.  Neurological:  Negative for dizziness, weakness, numbness and headaches.  Hematological:  Does not bruise/bleed easily.  Psychiatric/Behavioral:  Negative for behavioral problems. The patient is not nervous/anxious.     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   Bradycardia    per pt report   Chest discomfort 11/19/2019   Depression    situational    Diabetes mellitus due to underlying condition with unspecified complications ([place]) 68/05/4194   Diabetes mellitus without complication ([place])    on meds   Hyperlipidemia    on meds   Mixed dyslipidemia 01/01/2018   Osteopenia 10/2016   T score -1.3 FRAX 3.5%/0.1%   Rash and other nonspecific skin eruption 06/28/2021   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     Social History   Socioeconomic History   Marital status: Married    Spouse name: Not on file   Number of children: Not on file   Years of education: Not on file   Highest education level: Not on file  Occupational History   Not on file  Tobacco Use   Smoking status: Never   Smokeless tobacco: Never  Vaping Use   Vaping Use: Never used  Substance and Sexual Activity   Alcohol use: No   Drug use: No   Sexual activity: Yes    Partners: Male    Birth control/protection: Surgical    Comment: 1st intercourse- 23, partners - 2, married- 41 yrs, hysterectomy  Other Topics Concern   Not on file  Social History Narrative  Not on file   Social Determinants of Health   Financial Resource Strain: Not on file  Food Insecurity: Not on file  Transportation Needs: Not on file  Physical Activity: Not on file  Stress: Not on file  Social Connections: Not on file  Intimate Partner Violence: Not on file    Past Surgical History:  Procedure Laterality Date   CATARACT EXTRACTION Left    CESAREAN SECTION     x2   CHOLECYSTECTOMY, LAPAROSCOPIC  2010   KNEE ARTHROSCOPY W/ MENISCAL REPAIR Left 2011   PARTIAL HYSTERECTOMY  1988    Family History  Problem Relation Age of Onset   Heart attack Maternal Grandmother    Colon polyps Neg Hx    Colon  cancer Neg Hx    Esophageal cancer Neg Hx    Stomach cancer Neg Hx    Rectal cancer Neg Hx     No Known Allergies  Current Outpatient Medications on File Prior to Visit  Medication Sig Dispense Refill   Cholecalciferol (VITAMIN D3) 125 MCG (5000 UT) CAPS Take 5,000 Units by mouth daily.     cyclobenzaprine (FLEXERIL) 5 MG tablet 1 tab po q hs prn ha and trapezius pain 10 tablet 0   fluticasone (FLONASE) 50 MCG/ACT nasal spray SHAKE LIQUID AND USE 2 SPRAYS IN EACH NOSTRIL DAILY 48 g 0   glucose blood (ACCU-CHEK GUIDE) test strip USE TO TEST FOUR TIMES DAILY AS DIRECTED 200 strip 12   losartan (COZAAR) 50 MG tablet TAKE 1 TABLET(50 MG) BY MOUTH DAILY 90 tablet 0   MAGNESIUM PO Take 1 tablet by mouth daily.     metFORMIN (GLUCOPHAGE) 500 MG tablet Take 1 tablet (500 mg total) by mouth 2 (two) times daily with a meal. 180 tablet 3   atorvastatin (LIPITOR) 10 MG tablet Take 1 tablet (10 mg total) by mouth daily. 90 tablet 3   No current facility-administered medications on file prior to visit.    BP 120/77 Comment: pt states today at home always lower  Pulse 100   Temp 98.5 F (36.9 C)   Resp 18   Ht 5' (1.524 m)   Wt 180 lb (81.6 kg)   SpO2 99%   BMI 35.15 kg/m          Objective:   Physical Exam  General- No acute distress. Pleasant patient. Neck- Full range of motion, no jvd Lungs- Clear, even and unlabored. Heart- regular rate and rhythm. Neurologic- CNII- XII grossly intact.  Abdomen- soft, nt, nd, +bs, no rebound or guarding. No organomelgay.      Assessment & Plan:   Patient Instructions  Your  chronic rt upper quadrant pain and hx of fatty liver. This may be the cause. Normal lft. US done in recent past. Can use low dose tylenol 500 mg every 8 hours and can add on ibuprofen 200 mg if needed every 8 hours.  Will get cbc, cmp and lipase today. If you pain worsens let me know and will get imaging studies. If severe pain be seen in ED.  Did place referral to your  GI MD. For RUQ pain, hx of fatty liver and colon polyp. Hopefully they have cancellation and can see you sooner.  Htn- bp better at home.   Follow up in 6-8 weeks or sooner if needed(can update me by my chart as well)     Mackie Pai, PA-C

## 2022-02-15 LAB — CBC WITH DIFFERENTIAL/PLATELET
Basophils Absolute: 0.1 10*3/uL (ref 0.0–0.1)
Basophils Relative: 0.7 % (ref 0.0–3.0)
Eosinophils Absolute: 0.3 10*3/uL (ref 0.0–0.7)
Eosinophils Relative: 2.6 % (ref 0.0–5.0)
HCT: 40.2 % (ref 36.0–46.0)
Hemoglobin: 13.3 g/dL (ref 12.0–15.0)
Lymphocytes Relative: 34.2 % (ref 12.0–46.0)
Lymphs Abs: 3.3 10*3/uL (ref 0.7–4.0)
MCHC: 33.2 g/dL (ref 30.0–36.0)
MCV: 89.5 fl (ref 78.0–100.0)
Monocytes Absolute: 0.7 10*3/uL (ref 0.1–1.0)
Monocytes Relative: 7.7 % (ref 3.0–12.0)
Neutro Abs: 5.3 10*3/uL (ref 1.4–7.7)
Neutrophils Relative %: 54.8 % (ref 43.0–77.0)
Platelets: 347 10*3/uL (ref 150.0–400.0)
RBC: 4.49 Mil/uL (ref 3.87–5.11)
RDW: 13.8 % (ref 11.5–15.5)
WBC: 9.7 10*3/uL (ref 4.0–10.5)

## 2022-02-15 LAB — COMPREHENSIVE METABOLIC PANEL
ALT: 22 U/L (ref 0–35)
AST: 19 U/L (ref 0–37)
Albumin: 4.4 g/dL (ref 3.5–5.2)
Alkaline Phosphatase: 106 U/L (ref 39–117)
BUN: 15 mg/dL (ref 6–23)
CO2: 28 mEq/L (ref 19–32)
Calcium: 9.5 mg/dL (ref 8.4–10.5)
Chloride: 103 mEq/L (ref 96–112)
Creatinine, Ser: 0.59 mg/dL (ref 0.40–1.20)
GFR: 90.61 mL/min (ref >60.00)
Glucose, Bld: 75 mg/dL (ref 70–99)
Potassium: 3.8 mEq/L (ref 3.5–5.1)
Sodium: 139 mEq/L (ref 135–145)
Total Bilirubin: 0.3 mg/dL (ref 0.2–1.2)
Total Protein: 7.4 g/dL (ref 6.0–8.3)

## 2022-02-15 LAB — LIPASE: Lipase: 15 U/L (ref 11.0–59.0)

## 2022-03-13 NOTE — Progress Notes (Unsigned)
03/15/2022 Janet Peters [id number] 1950-06-14  Referring provider: Mackie Pai, PA-C Primary GI doctor: Dr. Hilarie Fredrickson  ASSESSMENT AND PLAN:   RUQ pain with history of cholecystectomy Sharp, intermittent pain lasting seconds, no associated symptoms, no rash Possible MSK, can try lidocaine patches/tylenol Get AB [place], check labs including H pylori though she has no GERD Trial of FDGard If not improving can consider endoscopic evaluation but no compelling information at this time  Early satiety with AB bloating No GERD, no melena, no nausea, no weight loss or alarm symptoms States has been long term, possible gastroparesis with DM Will get AB [place] Celiac, H pylori, CBC, CMET Given FDGARD and gastroparesis diet Will follow up in 4-6 weeks, if not better can consider EGD  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 - monitor q 6 months.   Diabetes mellitus with obesity Body mass index is 35.35 kg/m.  -Patient has been advised to make an attempt to improve diet and exercise patterns to aid in weight loss. -Recommended diet heavy in fruits and veggies and low in animal meats, cheeses, and dairy products, appropriate calorie intake   Patient Care Team: Saguier, Iris Pert as PCP - General (Internal Medicine)  *Due to language barrier, an interpreter was present during the history-taking and subsequent discussion (and for part of the physical exam) with this patient.   HISTORY OF PRESENT ILLNESS: 71 y.o. Spanish-speaking female with a past medical history of type 2 diabetes, hypertension, hyperlipidemia and others listed below presents for evaluation of abdominal pain.   04/15/2020 colonoscopy for screening colonoscopy routine, 7 mm polyp ascending colon, 2 polyps 4 to 5 mm size  transverse colon, diverticulosis and internal hemorrhoids.  Adenomatous polyps recall 3 years. 11/25/2020 CT abdomen pelvis with contrast for right lower quadrant abdominal pain diverticulosis no diverticulitis no acute findings.  Status postcholecystectomy  02/14/2022 office visit with PCP right upper quadrant abdominal pain, fatty liver.  Labs at that visit showed unremarkable CBC no anemia or leukocytosis.  Normal liver and kidney.  Lipase normal.  She has been having sharp pain, intermittent, RUQ pain, has been months to a year but had 2 weeks that was worse.  Can have radiation to her back.  Nothing causes the pain, can be for seconds.  No nausea, fever, chills.  Denies reflux, dysphagia.  Does not wake her at night.  She does not use oils, and she always feels like her stomach is tight or will explode.  She gets full very quickly. No weight loss.  She goes to gym for 4 days a week.  She has BM once a day, no melena, no hematochezia.  She states she has been on resveratrol for a month and feels it helps.  No NSAIDS, no ETOH.   Lab Results  Component Value Date   HGBA1C 7.2 (H) 12/28/2021    She  reports that she has never smoked. She has never used smokeless tobacco. She reports that she does not drink alcohol and does not use drugs.  Current Medications:   Current Outpatient Medications (Endocrine & Metabolic):    metFORMIN (GLUCOPHAGE) 500 MG tablet, Take 1 tablet (500 mg total) by mouth 2 (two) times daily with a meal.  Current Outpatient Medications (Cardiovascular):    losartan (COZAAR) 50 MG tablet, TAKE 1 TABLET(50 MG) BY MOUTH  DAILY   atorvastatin (LIPITOR) 10 MG tablet, Take 1 tablet (10 mg total) by mouth daily.  Current Outpatient Medications (Respiratory):    fluticasone (FLONASE) 50 MCG/ACT nasal spray, SHAKE LIQUID AND USE 2 SPRAYS IN EACH NOSTRIL DAILY    Current Outpatient Medications (Other):    Cholecalciferol (VITAMIN D3) 125 MCG (5000 UT) CAPS, Take  5,000 Units by mouth daily.   cyclobenzaprine (FLEXERIL) 5 MG tablet, 1 tab po q hs prn ha and trapezius pain   glucose blood (ACCU-CHEK GUIDE) test strip, USE TO TEST FOUR TIMES DAILY AS DIRECTED   MAGNESIUM PO, Take 1 tablet by mouth daily.   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   Bradycardia    per pt report   Chest discomfort 11/19/2019   Depression    situational    Diabetes mellitus due to underlying condition with unspecified complications ([place]) 25/11/5636   Diabetes mellitus without complication ([place])    on meds   Hyperlipidemia    on meds   Mixed dyslipidemia 01/01/2018   Osteopenia 10/2016   T score -1.3 FRAX 3.5%/0.1%   Rash and other nonspecific skin eruption 06/28/2021   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 Family History:  Her family history includes Heart attack in her maternal grandmother.  REVIEW OF SYSTEMS  : All other systems reviewed and negative except where noted in the History of Present Illness.  PHYSICAL EXAM: BP (!) 142/92   Pulse (!) 107   Ht 5' (1.524 m)   Wt 181 lb (82.1 kg)   BMI 35.35 kg/m  General:   Pleasant, well developed female in no acute distress Head:   Normocephalic and atraumatic. Eyes:  sclerae anicteric,conjunctive pink  Heart:   regular rate and rhythm Pulm:  Clear anteriorly; no wheezing Abdomen:   Soft, Obese AB, Active bowel sounds. No tenderness . , No organomegaly appreciated. Rectal: Not  evaluated Extremities:  Without edema. Msk: Symmetrical without gross deformities. Peripheral pulses intact.  Neurologic:  Alert and  oriented x4;  No focal deficits.  Skin:   Dry and intact without significant lesions or rashes. Psychiatric:  Cooperative. Normal mood and affect.  RELEVANT LABS AND IMAGING: CBC    Component Value Date/Time   WBC 9.7 02/14/2022 1433   RBC 4.49 02/14/2022 1433   HGB 13.3 02/14/2022 1433   HGB 13.2 05/28/2019 0926   HCT 40.2 02/14/2022 1433   HCT 39.5 05/28/2019 0926   PLT 347.0 02/14/2022 1433   PLT 364 05/28/2019 0926   MCV 89.5 02/14/2022 1433   MCV 87 05/28/2019 0926   MCH 29.7 12/18/2019 0922   MCHC 33.2 02/14/2022 1433   RDW 13.8 02/14/2022 1433   RDW 13.3 05/28/2019 0926   LYMPHSABS 3.3 02/14/2022 1433   LYMPHSABS 2.6 05/28/2019 0926   MONOABS 0.7 02/14/2022 1433   EOSABS 0.3 02/14/2022 1433   EOSABS 0.3 05/28/2019 0926   BASOSABS 0.1 02/14/2022 1433   BASOSABS 0.1 05/28/2019 0926    CMP  Component Value Date/Time   NA 139 02/14/2022 1433   NA 141 05/28/2019 0926   K 3.8 02/14/2022 1433   CL 103 02/14/2022 1433   CO2 28 02/14/2022 1433   GLUCOSE 75 02/14/2022 1433   BUN 15 02/14/2022 1433   BUN 10 05/28/2019 0926   CREATININE 0.59 02/14/2022 1433   CREATININE 0.56 12/18/2019 0922   CALCIUM 9.5 02/14/2022 1433   PROT 7.4 02/14/2022 1433   PROT 7.6 05/28/2019 0926   ALBUMIN 4.4 02/14/2022 1433   ALBUMIN 4.7 05/28/2019 0926   AST 19 02/14/2022 1433   ALT 22 02/14/2022 1433   ALKPHOS 106 02/14/2022 1433   BILITOT 0.3 02/14/2022 1433   BILITOT 0.2 05/28/2019 0926   GFRNONAA 90 05/28/2019 0926   GFRAA 104 05/28/2019 0926     Vladimir Crofts, PA-C 9:36 AM

## 2022-03-15 ENCOUNTER — Encounter: Payer: Self-pay | Admitting: Physician Assistant

## 2022-03-15 ENCOUNTER — Ambulatory Visit: Payer: Medicare Other | Admitting: Physician Assistant

## 2022-03-15 ENCOUNTER — Other Ambulatory Visit (INDEPENDENT_AMBULATORY_CARE_PROVIDER_SITE_OTHER): Payer: Medicare Other

## 2022-03-15 VITALS — BP 142/92 | HR 107 | Ht 60.0 in | Wt 181.0 lb

## 2022-03-15 DIAGNOSIS — K760 Fatty (change of) liver, not elsewhere classified: Secondary | ICD-10-CM | POA: Diagnosis not present

## 2022-03-15 DIAGNOSIS — R6881 Early satiety: Secondary | ICD-10-CM

## 2022-03-15 DIAGNOSIS — E119 Type 2 diabetes mellitus without complications: Secondary | ICD-10-CM

## 2022-03-15 DIAGNOSIS — R1011 Right upper quadrant pain: Secondary | ICD-10-CM

## 2022-03-15 DIAGNOSIS — D123 Benign neoplasm of transverse colon: Secondary | ICD-10-CM | POA: Diagnosis not present

## 2022-03-15 LAB — COMPREHENSIVE METABOLIC PANEL
ALT: 22 U/L (ref 0–35)
AST: 17 U/L (ref 0–37)
Albumin: 4.4 g/dL (ref 3.5–5.2)
Alkaline Phosphatase: 106 U/L (ref 39–117)
BUN: 15 mg/dL (ref 6–23)
CO2: 24 mEq/L (ref 19–32)
Calcium: 9.5 mg/dL (ref 8.4–10.5)
Chloride: 104 mEq/L (ref 96–112)
Creatinine, Ser: 0.68 mg/dL (ref 0.40–1.20)
GFR: 87.51 mL/min (ref >60.00)
Glucose, Bld: 168 mg/dL — ABNORMAL HIGH (ref 70–99)
Potassium: 3.7 mEq/L (ref 3.5–5.1)
Sodium: 139 mEq/L (ref 135–145)
Total Bilirubin: 0.4 mg/dL (ref 0.2–1.2)
Total Protein: 7.8 g/dL (ref 6.0–8.3)

## 2022-03-15 LAB — CBC WITH DIFFERENTIAL/PLATELET
Basophils Absolute: 0.1 10*3/uL (ref 0.0–0.1)
Basophils Relative: 0.7 % (ref 0.0–3.0)
Eosinophils Absolute: 0.2 10*3/uL (ref 0.0–0.7)
Eosinophils Relative: 2.6 % (ref 0.0–5.0)
HCT: 39.2 % (ref 36.0–46.0)
Hemoglobin: 13.3 g/dL (ref 12.0–15.0)
Lymphocytes Relative: 31.7 % (ref 12.0–46.0)
Lymphs Abs: 2.9 10*3/uL (ref 0.7–4.0)
MCHC: 33.9 g/dL (ref 30.0–36.0)
MCV: 87.4 fl (ref 78.0–100.0)
Monocytes Absolute: 0.5 10*3/uL (ref 0.1–1.0)
Monocytes Relative: 6 % (ref 3.0–12.0)
Neutro Abs: 5.4 10*3/uL (ref 1.4–7.7)
Neutrophils Relative %: 59 % (ref 43.0–77.0)
Platelets: 363 10*3/uL (ref 150.0–400.0)
RBC: 4.48 Mil/uL (ref 3.87–5.11)
RDW: 13.5 % (ref 11.5–15.5)
WBC: 9.1 10*3/uL (ref 4.0–10.5)

## 2022-03-15 NOTE — Patient Instructions (Addendum)
Your provider has requested that you go to the basement level for lab work before leaving today. Press "B" on the elevator. The lab is located at the first door on the left as you exit the elevator.  Salon pas patches are over the counter or voltern gel is topical antiinflammatory that is safe, can put on the area to see if this helps.   Take the FDGard twice a day and see how you do.   Gastroparesis Please do small frequent meals like 4-6 meals a day.  Eat and drink liquids at separate times.  Avoid high fiber foods, cook your vegetables, avoid high fat food.  Suggest spreading protein throughout the day (greek yogurt, glucerna, soft meat, milk, eggs) Choose soft foods that you can mash with a fork When you are more symptomatic, change to pureed foods foods and liquids.  Consider reading "Living well with Gastroparesis" by Lambert Keto Gastroparesis is a condition in which food takes longer than normal to empty from the stomach. This condition is also known as delayed gastric emptying. It is usually a long-term (chronic) condition. There is no cure, but there are treatments and things that you can do at home to help relieve symptoms. Treating the underlying condition that causes gastroparesis can also help relieve symptoms What are the causes? In many cases, the cause of this condition is not known. Possible causes include: A hormone (endocrine) disorder, such as hypothyroidism or diabetes. A nervous system disease, such as Parkinson's disease or multiple sclerosis. Cancer, infection, or surgery that affects the stomach or vagus nerve. The vagus nerve runs from your chest, through your neck, and to the lower part of your brain. A connective tissue disorder, such as scleroderma. Certain medicines. What increases the risk? You are more likely to develop this condition if: You have certain disorders or diseases. These may include: An endocrine disorder. An eating  disorder. Amyloidosis. Scleroderma. Parkinson's disease. Multiple sclerosis. Cancer or infection of the stomach or the vagus nerve. You have had surgery on your stomach or vagus nerve. You take certain medicines. You are female. What are the signs or symptoms? Symptoms of this condition include: Feeling full after eating very little or a loss of appetite. Nausea, vomiting, or heartburn. Bloating of your abdomen. Inconsistent blood sugar (glucose) levels on blood tests. Unexplained weight loss. Acid from the stomach coming up into the esophagus (gastroesophageal reflux). Sudden tightening (spasm) of the stomach, which can be painful. Symptoms may come and go. Some people may not notice any symptoms. How is this diagnosed? This condition is diagnosed with tests, such as: Tests that check how long it takes food to move through the stomach and intestines. These tests include: Upper gastrointestinal (GI) series. For this test, you drink a liquid that shows up well on X-rays, and then X-rays are taken of your intestines. Gastric emptying scintigraphy. For this test, you eat food that contains a small amount of radioactive material, and then scans are taken. Wireless capsule GI monitoring system. For this test, you swallow a pill (capsule) that records information about how foods and fluid move through your stomach. Gastric manometry. For this test, a tube is passed down your throat and into your stomach to measure electrical and muscular activity. Endoscopy. For this test, a long, thin tube with a camera and light on the end is passed down your throat and into your stomach to check for problems in your stomach lining. Ultrasound. This test uses sound waves to create images of  the inside of your body. This can help rule out gallbladder disease or pancreatitis as a cause of your symptoms. How is this treated? There is no cure for this condition, but treatment and home care may relieve symptoms.  Treatment may include: Treating the underlying cause. Managing your symptoms by making changes to your diet and exercise habits. Taking medicines to control nausea and vomiting and to stimulate stomach muscles. Getting food through a feeding tube in the hospital. This may be done in severe cases. Having surgery to insert a device called a gastric electrical stimulator into your body. This device helps improve stomach emptying and control nausea and vomiting. Follow these instructions at home: Take over-the-counter and prescription medicines only as told by your health care provider. Follow instructions from your health care provider about eating or drinking restrictions. Your health care provider may recommend that you: Eat smaller meals more often. Eat low-fat foods. Eat low-fiber forms of high-fiber foods. For example, eat cooked vegetables instead of raw vegetables. Have only liquid foods instead of solid foods. Liquid foods are easier to digest. Drink enough fluid to keep your urine pale yellow. Exercise as often as told by your health care provider. Keep all follow-up visits. This is important. Contact a health care provider if you: Notice that your symptoms do not improve with treatment. Have new symptoms. Get help right away if you: Have severe pain in your abdomen that does not improve with treatment. Have nausea that is severe or does not go away. Vomit every time you drink fluids. Summary Gastroparesis is a long-term (chronic) condition in which food takes longer than normal to empty from the stomach. Symptoms include nausea, vomiting, heartburn, bloating of your abdomen, and loss of appetite. Eating smaller portions, low-fat foods, and low-fiber forms of high-fiber foods may help you manage your symptoms. Get help right away if you have severe pain in your abdomen. This information is not intended to replace advice given to you by your health care provider. Make sure you  discuss any questions you have with your health care provider. Document Revised: 07/21/2019 Document Reviewed: 07/21/2019 Elsevier Patient Education  2021 [place]. __________________________________________________________________  Dennis Bast have been scheduled for an abdominal ultrasound at [facility] Entrance C on January 4th at 7:00am. Please arrive 15 minutes prior to your appointment for registration. Make certain not to have anything to eat or drink 6 hours prior to your appointment. Should you need to reschedule your appointment, please contact radiology at [phone]. This test typically takes about 30 minutes to perform.  Thank you for entrusting me with your care and for choosing [facility], Vicie Mutters, P.A.-C

## 2022-03-16 LAB — IGA: Immunoglobulin A: 209 mg/dL (ref 70–320)

## 2022-03-16 LAB — TISSUE TRANSGLUTAMINASE, IGA: (tTG) Ab, IgA: <1 U/mL

## 2022-03-16 NOTE — Progress Notes (Signed)
Addendum: Reviewed and agree with assessment and management plan. Marticia Reifschneider M, MD  

## 2022-03-29 ENCOUNTER — Telehealth: Payer: Medicare Other

## 2022-03-30 ENCOUNTER — Ambulatory Visit (HOSPITAL_COMMUNITY)
Admission: RE | Admit: 2022-03-30 | Discharge: 2022-03-30 | Disposition: A | Discharge status: Home / Self Care | Payer: Medicare Other | Source: Ambulatory Visit | Attending: Physician Assistant | Admitting: Physician Assistant

## 2022-03-30 ENCOUNTER — Other Ambulatory Visit: Payer: Medicare Other

## 2022-03-30 DIAGNOSIS — U071 COVID-19: Secondary | ICD-10-CM | POA: Diagnosis not present

## 2022-03-30 DIAGNOSIS — K760 Fatty (change of) liver, not elsewhere classified: Secondary | ICD-10-CM | POA: Insufficient documentation

## 2022-03-30 DIAGNOSIS — R6881 Early satiety: Secondary | ICD-10-CM

## 2022-03-30 DIAGNOSIS — R059 Cough, unspecified: Secondary | ICD-10-CM | POA: Diagnosis not present

## 2022-03-30 DIAGNOSIS — R1011 Right upper quadrant pain: Secondary | ICD-10-CM | POA: Diagnosis not present

## 2022-03-31 ENCOUNTER — Telehealth: Payer: Medicare Other | Admitting: Family

## 2022-03-31 LAB — HELICOBACTER PYLORI  SPECIAL ANTIGEN
MICRO NUMBER:: 14388464
SPECIMEN QUALITY: ADEQUATE

## 2022-04-24 ENCOUNTER — Other Ambulatory Visit (HOSPITAL_BASED_OUTPATIENT_CLINIC_OR_DEPARTMENT_OTHER): Payer: Self-pay | Admitting: Medical

## 2022-04-24 DIAGNOSIS — Z1231 Encounter for screening mammogram for malignant neoplasm of breast: Secondary | ICD-10-CM

## 2022-04-25 NOTE — Progress Notes (Signed)
04/28/2022 Janet Peters [id number] 1950/06/18  Referring provider: Mackie Pai, PA-C Primary GI doctor: Dr. Hilarie Fredrickson  ASSESSMENT AND PLAN:   RUQ pain with history of cholecystectomy Likley muscular, negative RUG, negative Hpylori, normal CBC, CMET This ha resolved with changing her exercise routine  Early satiety with AB bloating No AB pain, no GERD, no dysphagia, no weight loss, no need for endoscopy at this time, any new symptoms or not improving can consider it Given gastroparesis diet, will do trial of pepcid,  With history of DM, will get GES Given FODMAP information and discussed diet.   Benign neoplasm of transverse colon Adenomatous polyps recall 03/2023  Fatty liver --Continue to work on risk factor modification including diet exercise and control of risk factors including blood sugars. - monitor q 6 months.   Diabetes mellitus with obesity Body mass index is 32.74 kg/m.  -Patient has been advised to make an attempt to improve diet and exercise patterns to aid in weight loss. -Recommended diet heavy in fruits and veggies and low in animal meats, cheeses, and dairy products, appropriate calorie intake   Patient Care Team: Saguier, Iris Pert as PCP - General (Internal Medicine)  *Due to language barrier, an interpreter was present during the history-taking and subsequent discussion (and for part of the physical exam) with this patient.   HISTORY OF PRESENT ILLNESS: 72 y.o. Spanish-speaking female with a past medical history of type 2 diabetes, hypertension, hyperlipidemia and others listed below presents for evaluation of abdominal pain.   04/15/2020 colonoscopy for screening colonoscopy routine, 7 mm polyp ascending colon, 2 polyps 4 to 5 mm size transverse colon, diverticulosis and internal hemorrhoids.  Adenomatous polyps recall 3 years. 11/25/2020 CT abdomen pelvis with contrast for right lower quadrant abdominal pain diverticulosis no  diverticulitis no acute findings.  Status postcholecystectomy 02/14/2022 office visit with PCP right upper quadrant abdominal pain, fatty liver.  Labs at that visit showed unremarkable CBC no anemia or leukocytosis.  Normal liver and kidney.  Lipase normal. 03/15/2022 office visit with myself for right upper quadrant pain, no alarm symptoms thought possible musculoskeletal. 03/30/2022 abdominal ultrasound showed prior cholecystectomy, hepatic steatosis no biliary ductal dilatation. Negative H. pylori, negative celiac, unremarkable CBC no anemia or leukocytosis.  Normal kidney and liver, elevated sugars. Given trial of FD guard, gastroparesis diet, consider GES versus endoscopy.  She states the pain is better, had not had it in several months.  She stopped doing an exercise that she believes caused the pain.  She states the bloating has improved but still has some in the morning, thinks it is coffee or esikelil bread, worse in the morning can be before eating something.  Has been diabetic for 10 years She still gets full very quickly.  No nausea, fever, chills.  Denies reflux, dysphagia, nausea or vomiting. .  Does not wake her at night.  She gets full very quickly. No weight loss.  She has BM once or twice a day a day, no melena, no hematochezia.  No NSAIDS, no ETOH.   Lab Results  Component Value Date   HGBA1C 7.2 (H) 12/28/2021    She  reports that she has never smoked. She has never used smokeless tobacco. She reports that she does not drink alcohol and does not use drugs.  Current Medications:   Current Outpatient Medications (Endocrine & Metabolic):    metFORMIN (GLUCOPHAGE) 500 MG tablet, Take 1 tablet (500 mg total) by mouth 2 (two) times daily with a meal.  Current Outpatient Medications (Cardiovascular):    losartan (COZAAR) 50 MG tablet, TAKE 1 TABLET(50 MG) BY MOUTH DAILY   atorvastatin (LIPITOR) 10 MG tablet, Take 1 tablet (10 mg total) by mouth daily.  Current Outpatient  Medications (Respiratory):    fluticasone (FLONASE) 50 MCG/ACT nasal spray, SHAKE LIQUID AND USE 2 SPRAYS IN EACH NOSTRIL DAILY    Current Outpatient Medications (Other):    Cholecalciferol (VITAMIN D3) 125 MCG (5000 UT) CAPS, Take 5,000 Units by mouth daily.   famotidine (PEPCID) 40 MG tablet, Take 1 tablet (40 mg total) by mouth at bedtime.   glucose blood (ACCU-CHEK GUIDE) test strip, USE TO TEST FOUR TIMES DAILY AS DIRECTED   MAGNESIUM PO, Take 1 tablet by mouth daily.   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   Bradycardia    per pt report   Chest discomfort 11/19/2019   Depression    situational    Diabetes mellitus due to underlying condition with unspecified complications ([place]) 27/0/6237   Diabetes mellitus without complication ([place])    on meds   Hyperlipidemia    on meds   Mixed dyslipidemia 01/01/2018   Osteopenia 10/2016   T score -1.3 FRAX 3.5%/0.1%   Rash and other nonspecific skin eruption 06/28/2021   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 Family History:  Her family history includes Heart attack in her maternal grandmother.  REVIEW OF SYSTEMS  : All other systems reviewed and negative except where noted in the History of Present Illness.  PHYSICAL EXAM: BP 126/80   Pulse 80   Ht '5\' 2"'$  (1.575 m)   Wt 179 lb (81.2  kg)   BMI 32.74 kg/m  General:   Pleasant, well developed female in no acute distress Head:   Normocephalic and atraumatic. Eyes:  sclerae anicteric,conjunctive pink  Heart:   regular rate and rhythm Pulm:  Clear anteriorly; no wheezing Abdomen:   Soft, Obese AB, Active bowel sounds. No tenderness ,No organomegaly appreciated. Rectal: Not evaluated Extremities:  Without edema. Msk: Symmetrical without gross deformities. Peripheral pulses intact.  Neurologic:  Alert and  oriented x4;  No focal deficits.  Skin:   Dry and intact without significant lesions or rashes. Psychiatric:  Cooperative. Normal mood and affect.  RELEVANT LABS AND IMAGING: CBC    Component Value Date/Time   WBC 9.1 03/15/2022 0959   RBC 4.48 03/15/2022 0959   HGB 13.3 03/15/2022 0959   HGB 13.2 05/28/2019 0926   HCT 39.2 03/15/2022 0959   HCT 39.5 05/28/2019 0926   PLT 363.0 03/15/2022 0959   PLT 364 05/28/2019 0926   MCV 87.4 03/15/2022 0959   MCV 87 05/28/2019 0926   MCH 29.7 12/18/2019 0922   MCHC 33.9 03/15/2022 0959   RDW 13.5 03/15/2022 0959   RDW 13.3 05/28/2019 0926   LYMPHSABS 2.9 03/15/2022 0959  LYMPHSABS 2.6 05/28/2019 0926   MONOABS 0.5 03/15/2022 0959   EOSABS 0.2 03/15/2022 0959   EOSABS 0.3 05/28/2019 0926   BASOSABS 0.1 03/15/2022 0959   BASOSABS 0.1 05/28/2019 0926    CMP     Component Value Date/Time   NA 139 03/15/2022 0959   NA 141 05/28/2019 0926   K 3.7 03/15/2022 0959   CL 104 03/15/2022 0959   CO2 24 03/15/2022 0959   GLUCOSE 168 (H) 03/15/2022 0959   BUN 15 03/15/2022 0959   BUN 10 05/28/2019 0926   CREATININE 0.68 03/15/2022 0959   CREATININE 0.56 12/18/2019 0922   CALCIUM 9.5 03/15/2022 0959   PROT 7.8 03/15/2022 0959   PROT 7.6 05/28/2019 0926   ALBUMIN 4.4 03/15/2022 0959   ALBUMIN 4.7 05/28/2019 0926   AST 17 03/15/2022 0959   ALT 22 03/15/2022 0959   ALKPHOS 106 03/15/2022 0959   BILITOT 0.4 03/15/2022 0959   BILITOT 0.2 05/28/2019 0926   GFRNONAA 90  05/28/2019 0926   GFRAA 104 05/28/2019 0926     Vladimir Crofts, PA-C 9:01 AM

## 2022-04-27 ENCOUNTER — Ambulatory Visit: Payer: Self-pay

## 2022-04-27 ENCOUNTER — Encounter: Payer: Self-pay | Admitting: Family Medicine

## 2022-04-27 ENCOUNTER — Ambulatory Visit: Payer: Medicare Other | Admitting: Family Medicine

## 2022-04-27 VITALS — BP 130/74 | Ht 62.0 in | Wt 181.0 lb

## 2022-04-27 DIAGNOSIS — M6528 Calcific tendinitis, other site: Secondary | ICD-10-CM | POA: Diagnosis not present

## 2022-04-27 DIAGNOSIS — M7661 Achilles tendinitis, right leg: Secondary | ICD-10-CM

## 2022-04-27 DIAGNOSIS — M79671 Pain in right foot: Secondary | ICD-10-CM

## 2022-04-27 HISTORY — DX: Achilles tendinitis, right leg: M76.61

## 2022-04-27 HISTORY — DX: Calcific tendinitis, other site: M65.28

## 2022-04-27 NOTE — Progress Notes (Signed)
  Willow Shidler - 72 y.o. female MRN [id number]  Date of birth: 1950-10-04  SUBJECTIVE:  Including CC & ROS.  No chief complaint on file.   Kalilah Aaliayah Miao is a 72 y.o. female that is  presenting with acute achilles pain. Ongoing for the past 5 days. Feels the pain with walking on an incline. No history of similar pain. No injury.  An in person Spanish interpreter was used during this interview.  Review of Systems See HPI   HISTORY: Past Medical, Surgical, Social, and Family History Reviewed & Updated per EMR.   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   Bradycardia    per pt report   Chest discomfort 11/19/2019   Depression    situational    Diabetes mellitus due to underlying condition with unspecified complications ([place]) 67/05/4191   Diabetes mellitus without complication ([place])    on meds   Hyperlipidemia    on meds   Mixed dyslipidemia 01/01/2018   Osteopenia 10/2016   T score -1.3 FRAX 3.5%/0.1%   Rash and other nonspecific skin eruption 06/28/2021   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 No retrocalcaneal bursitis. Calcific changes at the insertion of the  lateral portion of the Achilles.  No hyperemia associated that juncture.  Summary: Findings most consistent with a calcific Achilles tendinitis.  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  Has calcific changes at the insertion of the Achilles but no significant hyperemia today.  Likely exacerbated with her incline use on the treadmill. -Counseled on home exercise therapy and supportive care. -Green sport insoles with heel lift. -Counseled on Voltaren gel. -Could get shockwave therapy or physical therapy.

## 2022-04-27 NOTE — Patient Instructions (Addendum)
Good to see you Please use ice as needed  You can try voltaren over the counter gel  You can consider compression with exercises  Please try the exercises  You can use the insoles with the heel lift until the pain is improved and then you can remove the heel lifts   Please send me a message in MyChart with any questions or updates.  Please see me back in 4-6 weeks or as needed if better.   --Dr. Lorina Rabon bueno verte Utilice hielo segn sea necesario. Puedes probar el gel voltaren de venta libre. Puedes considerar la compresin con ejercicios. Por favor prueba los ejercicios. Puedes usar las plantillas con Scientist, water quality de taln hasta que el dolor mejore y luego puedes quitar los elevadores de taln. Por favor enveme un mensaje en MyChart con cualquier pregunta o actualizacin. Vame nuevamente en 4 a 6 semanas o segn sea necesario si es mejor.

## 2022-04-27 NOTE — Assessment & Plan Note (Signed)
Acutely occurring.  Has calcific changes at the insertion of the Achilles but no significant hyperemia today.  Likely exacerbated with her incline use on the treadmill. -Counseled on home exercise therapy and supportive care. -Green sport insoles with heel lift. -Counseled on Voltaren gel. -Could get shockwave therapy or physical therapy.

## 2022-04-28 ENCOUNTER — Ambulatory Visit: Payer: Medicare Other | Admitting: Physician Assistant

## 2022-04-28 ENCOUNTER — Encounter: Payer: Self-pay | Admitting: Physician Assistant

## 2022-04-28 VITALS — BP 126/80 | HR 80 | Ht 62.0 in | Wt 179.0 lb

## 2022-04-28 DIAGNOSIS — D123 Benign neoplasm of transverse colon: Secondary | ICD-10-CM

## 2022-04-28 DIAGNOSIS — R6881 Early satiety: Secondary | ICD-10-CM

## 2022-04-28 DIAGNOSIS — K760 Fatty (change of) liver, not elsewhere classified: Secondary | ICD-10-CM

## 2022-04-28 DIAGNOSIS — E119 Type 2 diabetes mellitus without complications: Secondary | ICD-10-CM | POA: Diagnosis not present

## 2022-04-28 DIAGNOSIS — R1011 Right upper quadrant pain: Secondary | ICD-10-CM

## 2022-04-28 MED ORDER — FAMOTIDINE 40 MG PO TABS: 40.0000 mg | ORAL_TABLET | Freq: Every day | ORAL | 3 refills | Status: DC

## 2022-04-28 NOTE — Patient Instructions (Addendum)
You will be contacted by [facility] in the next 2 days to arrange a 4 Hour Gastric Emptying Scan .  The number on your caller ID will be [phone], please answer when they call.  If you have not heard from them in 2 days please call [phone] to schedule.     We have sent the following medications to your pharmacy for you to pick up at your convenience: Pepcid  If the pain returns can try tyelnol max 3000 mg a day, OR salon pas patches are over the counter and voltern gel is topical antiinflammatory that is safe.   Try the pepcid at night for a month to see if this helps   Please take this medication 30 minutes to 1 hour before meals- this makes it more effective.  Avoid spicy and acidic foods Avoid fatty foods Limit your intake of coffee, tea, alcohol, and carbonated drinks Work to maintain a healthy weight Keep the head of the bed elevated at least 3 inches with blocks or a wedge pillow if you are having any nighttime symptoms Stay upright for 2 hours after eating Avoid meals and snacks three to four hours before bedtime   Gastroparesis Please do small frequent meals like 4-6 meals a day.  Eat and drink liquids at separate times.  Avoid high fiber foods, cook your vegetables, avoid high fat food.  Suggest spreading protein throughout the day (greek yogurt, glucerna, soft meat, milk, eggs) Choose soft foods that you can mash with a fork When you are more symptomatic, change to pureed foods foods and liquids.  Consider reading "Living well with Gastroparesis" by Lambert Keto Gastroparesis is a condition in which food takes longer than normal to empty from the stomach. This condition is also known as delayed gastric emptying. It is usually a long-term (chronic) condition. There is no cure, but there are treatments and things that you can do at home to help relieve symptoms. Treating the underlying condition that causes gastroparesis can also help relieve  symptoms What are the causes? In many cases, the cause of this condition is not known. Possible causes include: A hormone (endocrine) disorder, such as hypothyroidism or diabetes. A nervous system disease, such as Parkinson's disease or multiple sclerosis. Cancer, infection, or surgery that affects the stomach or vagus nerve. The vagus nerve runs from your chest, through your neck, and to the lower part of your brain. A connective tissue disorder, such as scleroderma. Certain medicines. What increases the risk? You are more likely to develop this condition if: You have certain disorders or diseases. These may include: An endocrine disorder. An eating disorder. Amyloidosis. Scleroderma. Parkinson's disease. Multiple sclerosis. Cancer or infection of the stomach or the vagus nerve. You have had surgery on your stomach or vagus nerve. You take certain medicines. You are female. What are the signs or symptoms? Symptoms of this condition include: Feeling full after eating very little or a loss of appetite. Nausea, vomiting, or heartburn. Bloating of your abdomen. Inconsistent blood sugar (glucose) levels on blood tests. Unexplained weight loss. Acid from the stomach coming up into the esophagus (gastroesophageal reflux). Sudden tightening (spasm) of the stomach, which can be painful. Symptoms may come and go. Some people may not notice any symptoms. How is this diagnosed? This condition is diagnosed with tests, such as: Tests that check how long it takes food to move through the stomach and intestines. These tests include: Upper gastrointestinal (GI) series. For this test, you drink a  liquid that shows up well on X-rays, and then X-rays are taken of your intestines. Gastric emptying scintigraphy. For this test, you eat food that contains a small amount of radioactive material, and then scans are taken. Wireless capsule GI monitoring system. For this test, you swallow a pill (capsule)  that records information about how foods and fluid move through your stomach. Gastric manometry. For this test, a tube is passed down your throat and into your stomach to measure electrical and muscular activity. Endoscopy. For this test, a long, thin tube with a camera and light on the end is passed down your throat and into your stomach to check for problems in your stomach lining. Ultrasound. This test uses sound waves to create images of the inside of your body. This can help rule out gallbladder disease or pancreatitis as a cause of your symptoms. How is this treated? There is no cure for this condition, but treatment and home care may relieve symptoms. Treatment may include: Treating the underlying cause. Managing your symptoms by making changes to your diet and exercise habits. Taking medicines to control nausea and vomiting and to stimulate stomach muscles. Getting food through a feeding tube in the hospital. This may be done in severe cases. Having surgery to insert a device called a gastric electrical stimulator into your body. This device helps improve stomach emptying and control nausea and vomiting. Follow these instructions at home: Take over-the-counter and prescription medicines only as told by your health care provider. Follow instructions from your health care provider about eating or drinking restrictions. Your health care provider may recommend that you: Eat smaller meals more often. Eat low-fat foods. Eat low-fiber forms of high-fiber foods. For example, eat cooked vegetables instead of raw vegetables. Have only liquid foods instead of solid foods. Liquid foods are easier to digest. Drink enough fluid to keep your urine pale yellow. Exercise as often as told by your health care provider. Keep all follow-up visits. This is important. Contact a health care provider if you: Notice that your symptoms do not improve with treatment. Have new symptoms. Get help right away if  you: Have severe pain in your abdomen that does not improve with treatment. Have nausea that is severe or does not go away. Vomit every time you drink fluids. Summary Gastroparesis is a long-term (chronic) condition in which food takes longer than normal to empty from the stomach. Symptoms include nausea, vomiting, heartburn, bloating of your abdomen, and loss of appetite. Eating smaller portions, low-fat foods, and low-fiber forms of high-fiber foods may help you manage your symptoms. Get help right away if you have severe pain in your abdomen. This information is not intended to replace advice given to you by your health care provider. Make sure you discuss any questions you have with your health care provider. Document Revised: 07/21/2019 Document Reviewed: 07/21/2019 Elsevier Patient Education  2021 [place].  Abdominal bloating and discomfort may be due to intestinal sensitivity or symptoms of irritable bowel syndrome. To relieve symptoms, avoid:  Broccoli  Baked beans  Cabbage  Carbonated drinks  Cauliflower  Chewing gum  Hard candy Abdominal distention resulting from weak abdominal muscles:  Is better in the morning  Gets worse as the day progresses  Is relieved by lying down Flatulence is gas created through bacterial action in the bowel and passed rectally. Keep in mind that:  10-18 passages per day are normal  Primary gases are harmless and odorless  Noticeable smells are trace gases related  to food intake Foods to AVOID that are likely to form gas include:  Milk, dairy products, and medications that contain lactose--If your body doesn't produce the enzyme (lactase) to break it down.  Certain vegetables--baked beans, cauliflower, broccoli, cabbage  Certain starches--wheat, oats, corn, potatoes. Rice is a good substitute. Identify offending foods. Reduce or eliminate these gas-forming foods from your diet. Can look at the [place].   Due to recent changes in  healthcare laws, you may see the results of your imaging and laboratory studies on MyChart before your provider has had a chance to review them.  We understand that in some cases there may be results that are confusing or concerning to you. Not all laboratory results come back in the same time frame and the provider may be waiting for multiple results in order to interpret others.  Please give [place] 48 hours in order for your provider to thoroughly review all the results before contacting the office for clarification of your results.    Thank you for choosing [facility]  [facility]

## 2022-04-28 NOTE — Progress Notes (Signed)
Addendum: Reviewed and agree with assessment and management plan. Railey Glad M, MD  

## 2022-04-28 NOTE — Progress Notes (Signed)
Addendum: Reviewed and agree with assessment and management plan. Mashell Sieben M, MD  

## 2022-05-15 ENCOUNTER — Encounter (HOSPITAL_COMMUNITY)
Admission: RE | Admit: 2022-05-15 | Discharge: 2022-05-15 | Disposition: A | Discharge status: Home / Self Care | Payer: Medicare Other | Source: Ambulatory Visit | Attending: Physician Assistant | Admitting: Physician Assistant

## 2022-05-15 DIAGNOSIS — R6881 Early satiety: Secondary | ICD-10-CM | POA: Insufficient documentation

## 2022-05-15 DIAGNOSIS — Z0389 Encounter for observation for other suspected diseases and conditions ruled out: Secondary | ICD-10-CM | POA: Diagnosis not present

## 2022-05-15 DIAGNOSIS — E119 Type 2 diabetes mellitus without complications: Secondary | ICD-10-CM

## 2022-05-15 MED ORDER — TECHNETIUM TC 99M SULFUR COLLOID: 2.1800 | Freq: Once | INTRAVENOUS | Status: DC | PRN

## 2022-05-15 MED ORDER — TECHNETIUM TC 99M SULFUR COLLOID
2.1800 | Freq: Once | INTRAVENOUS | Status: AC | PRN
  Administered 2022-05-15: 2.18 via ORAL

## 2022-05-25 ENCOUNTER — Ambulatory Visit (HOSPITAL_BASED_OUTPATIENT_CLINIC_OR_DEPARTMENT_OTHER)
Admission: RE | Admit: 2022-05-25 | Discharge: 2022-05-25 | Disposition: A | Discharge status: Home / Self Care | Payer: Medicare Other | Source: Ambulatory Visit | Attending: Medical | Admitting: Medical

## 2022-05-25 ENCOUNTER — Ambulatory Visit (INDEPENDENT_AMBULATORY_CARE_PROVIDER_SITE_OTHER): Payer: Medicare Other | Admitting: Medical

## 2022-05-25 ENCOUNTER — Ambulatory Visit: Payer: Medicare Other | Admitting: Medical

## 2022-05-25 VITALS — BP 124/60 | HR 90 | Temp 98.2°F | Resp 18 | Ht 62.0 in | Wt 180.0 lb

## 2022-05-25 DIAGNOSIS — I10 Essential (primary) hypertension: Secondary | ICD-10-CM | POA: Diagnosis not present

## 2022-05-25 DIAGNOSIS — E118 Type 2 diabetes mellitus with unspecified complications: Secondary | ICD-10-CM | POA: Diagnosis not present

## 2022-05-25 DIAGNOSIS — R059 Cough, unspecified: Secondary | ICD-10-CM | POA: Diagnosis not present

## 2022-05-25 DIAGNOSIS — E7849 Other hyperlipidemia: Secondary | ICD-10-CM

## 2022-05-25 DIAGNOSIS — Z794 Long term (current) use of insulin: Secondary | ICD-10-CM | POA: Diagnosis not present

## 2022-05-25 LAB — COMPREHENSIVE METABOLIC PANEL
ALT: 22 U/L (ref 0–35)
AST: 17 U/L (ref 0–37)
Albumin: 4.2 g/dL (ref 3.5–5.2)
Alkaline Phosphatase: 102 U/L (ref 39–117)
BUN: 14 mg/dL (ref 6–23)
CO2: 25 mEq/L (ref 19–32)
Calcium: 9.7 mg/dL (ref 8.4–10.5)
Chloride: 104 mEq/L (ref 96–112)
Creatinine, Ser: 0.67 mg/dL (ref 0.40–1.20)
GFR: 87.71 mL/min (ref >60.00)
Glucose, Bld: 113 mg/dL — ABNORMAL HIGH (ref 70–99)
Potassium: 4 mEq/L (ref 3.5–5.1)
Sodium: 141 mEq/L (ref 135–145)
Total Bilirubin: 0.4 mg/dL (ref 0.2–1.2)
Total Protein: 7.4 g/dL (ref 6.0–8.3)

## 2022-05-25 LAB — CBC WITH DIFFERENTIAL/PLATELET
Basophils Absolute: 0 10*3/uL (ref 0.0–0.1)
Basophils Relative: 0.4 % (ref 0.0–3.0)
Eosinophils Absolute: 0.3 10*3/uL (ref 0.0–0.7)
Eosinophils Relative: 3.3 % (ref 0.0–5.0)
HCT: 40.1 % (ref 36.0–46.0)
Hemoglobin: 13.3 g/dL (ref 12.0–15.0)
Lymphocytes Relative: 33.9 % (ref 12.0–46.0)
Lymphs Abs: 2.9 10*3/uL (ref 0.7–4.0)
MCHC: 33.1 g/dL (ref 30.0–36.0)
MCV: 89.4 fl (ref 78.0–100.0)
Monocytes Absolute: 0.6 10*3/uL (ref 0.1–1.0)
Monocytes Relative: 6.4 % (ref 3.0–12.0)
Neutro Abs: 4.8 10*3/uL (ref 1.4–7.7)
Neutrophils Relative %: 56 % (ref 43.0–77.0)
Platelets: 328 10*3/uL (ref 150.0–400.0)
RBC: 4.49 Mil/uL (ref 3.87–5.11)
RDW: 13.4 % (ref 11.5–15.5)
WBC: 8.6 10*3/uL (ref 4.0–10.5)

## 2022-05-25 LAB — LIPID PANEL
Cholesterol: 177 mg/dL (ref 0–200)
HDL: 50 mg/dL (ref >39.00)
NonHDL: 126.67
Total CHOL/HDL Ratio: 4
Triglycerides: 308 mg/dL — ABNORMAL HIGH (ref 0.0–149.0)
VLDL: 61.6 mg/dL — ABNORMAL HIGH (ref 0.0–40.0)

## 2022-05-25 LAB — LDL CHOLESTEROL, DIRECT: Direct LDL: 92 mg/dL

## 2022-05-25 LAB — HEMOGLOBIN A1C: Hgb A1c MFr Bld: 7.6 % — ABNORMAL HIGH (ref 4.6–6.5)

## 2022-05-25 MED ORDER — LEVOCETIRIZINE DIHYDROCHLORIDE 5 MG PO TABS: 5.0000 mg | ORAL_TABLET | Freq: Every evening | ORAL | 3 refills | Status: DC

## 2022-05-25 NOTE — Patient Instructions (Addendum)
Type 2 diabetes mellitus with complication, with long-term current use of insulin (HCC) Continue metformin. Continue low sugar diet. - Microalbumin, urine - CBC w/Diff - Hemoglobin A1c - Comp Met (CMET)   Hypertension, unspecified type. Bp controlled today. Continue losartan.  Other hyperlipidemia Continue atorvastatin and get lipid panel today.  For chronic cough for 2 years get cxr. Rx xyzal antihistamine as trial in event allergic rhinitis.  Follow up in 3 months or sooner if needed.

## 2022-05-25 NOTE — Progress Notes (Signed)
Subjective:    Patient ID: Janet Peters, female    DOB: December 20, 1950, 72 y.o.   MRN: [id number]  HPI  For diabetes cmp and a1c done last visit. Continue metformin. Last a1c was 7.2.    Htn- bp well controlled. Continue losartan.   For high cholesterol continue atorvastatin.  Pt had one shingrix vaccine already. Scheduled to get second in April.  Pt had cough for 2 years. No ace use . Started losartan after cough started. Cough is usually at night. Not productive. No sob or wheezing. No hx of smoking.     Review of Systems  Constitutional:  Negative for chills, fatigue and fever.  HENT:  Negative for congestion, drooling, ear discharge and ear pain.   Respiratory:  Negative for cough, chest tightness, shortness of breath and wheezing.   Cardiovascular:  Negative for chest pain and palpitations.  Gastrointestinal:  Negative for abdominal distention, abdominal pain, anal bleeding, blood in stool and diarrhea.  Genitourinary:  Negative for dysuria, flank pain, hematuria and urgency.  Musculoskeletal:  Negative for back pain and joint swelling.  Skin:  Negative for rash.  Neurological:  Negative for dizziness, speech difficulty, numbness and headaches.  Hematological:  Negative for adenopathy. Does not bruise/bleed easily.  Psychiatric/Behavioral:  Negative for behavioral problems, decreased concentration, dysphoric mood and sleep disturbance. The patient is not nervous/anxious.     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   Bradycardia    per pt report   Chest discomfort 11/19/2019   Depression    situational    Diabetes mellitus due to underlying condition with unspecified complications ([place]) [id number]   Diabetes mellitus without complication ([place])    on meds   Hyperlipidemia    on  meds   Mixed dyslipidemia 01/01/2018   Osteopenia 10/2016   T score -1.3 FRAX 3.5%/0.1%   Rash and other nonspecific skin eruption 06/28/2021   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     Social History   Socioeconomic History   Marital status: Married    Spouse name: Not on file   Number of children: Not on file   Years of education: Not on file   Highest education level: Not on file  Occupational History   Not on file  Tobacco Use   Smoking status: Never   Smokeless tobacco: Never  Vaping Use   Vaping Use: Never used  Substance and Sexual Activity   Alcohol use: No   Drug use: No   Sexual activity: Yes    Partners: Male    Birth control/protection: Surgical    Comment: 1st intercourse- 23, partners - 2, married- 55 yrs, hysterectomy  Other Topics Concern   Not on file  Social History Narrative   Not on file   Social Determinants of Health   Financial Resource Strain: Not on file  Food Insecurity: Not on file  Transportation Needs: Not on file  Physical Activity: Not on file  Stress: Not on file  Social Connections: Not on file  Intimate Partner Violence: Not on file    Past Surgical History:  Procedure Laterality Date   CATARACT EXTRACTION Left    CESAREAN SECTION     x2   CHOLECYSTECTOMY, LAPAROSCOPIC  2010   KNEE ARTHROSCOPY W/  MENISCAL REPAIR Left 2011   PARTIAL HYSTERECTOMY  1988    Family History  Problem Relation Age of Onset   Heart attack Maternal Grandmother    Colon polyps Neg Hx    Colon cancer Neg Hx    Esophageal cancer Neg Hx    Stomach cancer Neg Hx    Rectal cancer Neg Hx     No Known Allergies  Current Outpatient Medications on File Prior to Visit  Medication Sig Dispense Refill   atorvastatin (LIPITOR) 10 MG tablet Take 1 tablet (10 mg total) by mouth daily. 90 tablet 3   Cholecalciferol (VITAMIN D3) 125 MCG (5000 UT) CAPS Take 5,000 Units by mouth daily.     cyclobenzaprine (FLEXERIL) 5 MG tablet 1 tab po q hs prn  ha and trapezius pain (Patient not taking: Reported on 04/28/2022) 10 tablet 0   famotidine (PEPCID) 40 MG tablet Take 1 tablet (40 mg total) by mouth at bedtime. 30 tablet 3   fluticasone (FLONASE) 50 MCG/ACT nasal spray SHAKE LIQUID AND USE 2 SPRAYS IN EACH NOSTRIL DAILY 48 g 0   glucose blood (ACCU-CHEK GUIDE) test strip USE TO TEST FOUR TIMES DAILY AS DIRECTED 200 strip 12   losartan (COZAAR) 50 MG tablet TAKE 1 TABLET(50 MG) BY MOUTH DAILY 90 tablet 0   MAGNESIUM PO Take 1 tablet by mouth daily.     metFORMIN (GLUCOPHAGE) 500 MG tablet Take 1 tablet (500 mg total) by mouth 2 (two) times daily with a meal. 180 tablet 3   NON FORMULARY Pt taking organic resveration     No current facility-administered medications on file prior to visit.    BP 124/60   Pulse 90   Temp 98.2 F (36.8 C)   Resp 18   Ht '5\' 2"'$  (1.575 m)   Wt 180 lb (81.6 kg)   SpO2 100%   BMI 32.92 kg/m        Objective:   Physical Exam  General Mental Status- Alert. General Appearance- Not in acute distress.   Skin General: Color- Normal Color. Moisture- Normal Moisture.  Neck Carotid Arteries- Normal color. Moisture- Normal Moisture. No carotid bruits. No JVD.  Chest and Lung Exam Auscultation: Breath Sounds:-Normal.  Cardiovascular Auscultation:Rythm- Regular. Murmurs & Other Heart Sounds:Auscultation of the heart reveals- No Murmurs.  Abdomen Inspection:-Inspeection Normal. Palpation/Percussion:Note:No mass. Palpation and Percussion of the abdomen reveal- Non Tender, Non Distended + BS, no rebound or guarding.  Neurologic Cranial Nerve exam:- CN III-XII intact(No nystagmus), symmetric smile. Strength:- 5/5 equal and symmetric strength both upper and lower extremities.   Feet exam- see quality metrics   Heent- no sinus pressure. +pnd.     Assessment & Plan:   Patient Instructions  Type 2 diabetes mellitus with complication, with long-term current use of insulin (HCC) Continue metformin.  Continue low sugar diet. - Microalbumin, urine - CBC w/Diff - Hemoglobin A1c - Comp Met (CMET)   Hypertension, unspecified type. Bp controlled today. Continue losartan.  Other hyperlipidemia Continue atorvastatin and get lipid panel today.  For chronic cough for 2 years get cxr. Rx xyzal antihistamine as trial in event allergic rhinitis.  Follow up in 3 months or sooner if needed.     Mackie Pai PA-C

## 2022-05-26 LAB — MICROALBUMIN, URINE: Microalb, Ur: 0.4 mg/dL

## 2022-05-27 MED ORDER — METFORMIN HCL 500 MG PO TABS: ORAL_TABLET | ORAL | 3 refills | Status: DC

## 2022-05-27 MED ORDER — FENOFIBRATE 48 MG PO TABS: 48.0000 mg | ORAL_TABLET | Freq: Every day | ORAL | 3 refills | Status: DC

## 2022-05-27 NOTE — Addendum Note (Signed)
Addended by: Anabel Halon on: 05/27/2022 08:08 AM   Modules accepted: Orders

## 2022-05-27 NOTE — Addendum Note (Signed)
Addended by: Anabel Halon on: 05/27/2022 08:07 AM   Modules accepted: Orders

## 2022-06-01 MED ORDER — SITAGLIPTIN PHOSPHATE 25 MG PO TABS: 25.0000 mg | ORAL_TABLET | Freq: Every day | ORAL | 3 refills | Status: DC

## 2022-06-01 NOTE — Addendum Note (Signed)
Addended by: Anabel Halon on: 06/01/2022 11:53 AM   Modules accepted: Orders

## 2022-06-22 ENCOUNTER — Ambulatory Visit: Payer: Medicare Other | Admitting: Physician Assistant

## 2022-07-04 ENCOUNTER — Encounter (HOSPITAL_BASED_OUTPATIENT_CLINIC_OR_DEPARTMENT_OTHER): Payer: Self-pay

## 2022-07-04 ENCOUNTER — Ambulatory Visit (HOSPITAL_BASED_OUTPATIENT_CLINIC_OR_DEPARTMENT_OTHER)
Admission: RE | Admit: 2022-07-04 | Discharge: 2022-07-04 | Disposition: A | Discharge status: Home / Self Care | Payer: Medicare Other | Source: Ambulatory Visit | Attending: Medical | Admitting: Medical

## 2022-07-04 DIAGNOSIS — Z1231 Encounter for screening mammogram for malignant neoplasm of breast: Secondary | ICD-10-CM

## 2022-07-05 NOTE — Progress Notes (Signed)
07/10/2022 Janet Peters [id number] September 29, 1950  Referring provider: Esperanza Richters, PA-C Primary GI doctor: Dr. Rhea Belton  ASSESSMENT AND PLAN:   RUQ pain with history of cholecystectomy Likley muscular, has improved with changing her exercises.  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 - monitor q 6 months.   Diabetes mellitus with obesity Body mass index is 32.3 kg/m.  -Patient has been advised to make an attempt to improve diet and exercise patterns to aid in weight loss. -Recommended diet heavy in fruits and veggies and low in animal meats, cheeses, and dairy products, appropriate calorie intake   Patient Care Team: Saguier, Kateri Mc as PCP - General (Internal Medicine)  *Due to language barrier, an interpreter was present during the history-taking and subsequent discussion (and for part of the physical exam) with this patient.   HISTORY OF PRESENT ILLNESS: 72 y.o. Spanish-speaking female with a past medical history of type 2 diabetes, hypertension, hyperlipidemia and others listed below presents for evaluation of abdominal pain.   04/15/2020 colonoscopy for screening colonoscopy routine, 7 mm polyp ascending colon, 2 polyps 4 to 5 mm size transverse colon, diverticulosis and internal hemorrhoids.  Adenomatous polyps recall 3 years. ( 03/2023) 11/25/2020 CT abdomen pelvis with contrast for right lower quadrant abdominal pain diverticulosis no diverticulitis no acute findings.  Status postcholecystectomy 02/14/2022 office visit with PCP  right upper quadrant abdominal pain, fatty liver.  Labs at that visit showed unremarkable CBC no anemia or leukocytosis.  Normal liver and kidney.  Lipase normal. 03/15/2022 office visit with myself for right upper quadrant pain, no alarm symptoms thought possible musculoskeletal. 03/30/2022 abdominal ultrasound showed prior cholecystectomy, hepatic steatosis no biliary ductal dilatation. Negative H. pylori, negative celiac, unremarkable CBC no anemia or leukocytosis.   Normal kidney and liver, elevated sugars. Given trial of FD guard, gastroparesis diet 05/15/2022 GES showed no delayed gastric emptying.   Here for follow up.  She states she is not having any pain.  She is not having any more bloating.  She has some fullness quickly with bread mainly.  No nausea, fever, chills.  Denies reflux, dysphagia, nausea or vomiting. .  No weight loss.  She has BM once or twice a day a day, no melena, no hematochezia.  No NSAIDS, no ETOH.    Lab Results  Component Value Date   HGBA1C 7.6 (H) 05/25/2022    She  reports that she has never smoked. She has never used smokeless tobacco. She reports that she does not drink alcohol and does not use drugs.  Current Medications:   Current Outpatient Medications (Endocrine & Metabolic):    metFORMIN (GLUCOPHAGE) 500 MG tablet, 2 tab in am and 1 tab pm   sitaGLIPtin (JANUVIA) 25 MG tablet, Take 1 tablet (25 mg total) by mouth daily.  Current Outpatient Medications (Cardiovascular):    atorvastatin (LIPITOR) 10 MG tablet, Take 1 tablet (10 mg total) by mouth daily.   fenofibrate (TRICOR) 48 MG tablet, Take 1 tablet (48 mg total) by mouth daily.   losartan (COZAAR) 50 MG tablet, TAKE 1 TABLET(50 MG) BY MOUTH DAILY  Current Outpatient Medications (Respiratory):    fluticasone (FLONASE) 50 MCG/ACT nasal spray, SHAKE LIQUID AND USE 2 SPRAYS IN EACH NOSTRIL DAILY   levocetirizine (XYZAL) 5 MG tablet, Take 1 tablet (5 mg total) by mouth every  evening.    Current Outpatient Medications (Other):    Cholecalciferol (VITAMIN D3) 125 MCG (5000 UT) CAPS, Take 5,000 Units by mouth daily.   glucose blood (ACCU-CHEK GUIDE) test strip, USE TO TEST FOUR TIMES DAILY AS DIRECTED   MAGNESIUM PO, Take 1 tablet by mouth daily.   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   Bradycardia    per pt report   Chest discomfort 11/19/2019   Depression    situational    Diabetes mellitus due to underlying condition with unspecified complications 01/01/2018   Diabetes mellitus without complication    on meds   Hyperlipidemia    on meds   Mixed dyslipidemia 01/01/2018   Osteopenia 10/2016   T score -1.3 FRAX 3.5%/0.1%   Rash and other nonspecific skin eruption 06/28/2021   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 Family History:  Her family history includes Heart attack in her maternal grandmother.  REVIEW OF SYSTEMS  : All other systems reviewed and negative except where noted in the History of Present Illness.  PHYSICAL EXAM: BP 110/68   Pulse 100   Ht 5\' 2"  (1.575 m)   Wt 176 lb 9.6 oz (80.1 kg)   BMI 32.30 kg/m  General:   Pleasant, well developed female in no acute distress Head:   Normocephalic and atraumatic. Eyes:  sclerae anicteric,conjunctive pink  Heart:   regular rate and rhythm Pulm:  Clear anteriorly; no wheezing Abdomen:   Soft, Obese AB, Active bowel sounds. No tenderness ,No organomegaly  appreciated. Rectal: Not evaluated Extremities:  Without edema. Msk: Symmetrical without gross deformities. Peripheral pulses intact.  Neurologic:  Alert and  oriented x4;  No focal deficits.  Skin:   Dry and intact without significant lesions or rashes. Psychiatric:  Cooperative. Normal mood and affect.  RELEVANT LABS AND IMAGING: CBC    Component Value Date/Time   WBC 8.6 05/25/2022 0919   RBC 4.49 05/25/2022 0919   HGB 13.3 05/25/2022 0919   HGB 13.2 05/28/2019 0926   HCT 40.1 05/25/2022 0919   HCT 39.5 05/28/2019 0926   PLT 328.0 05/25/2022 0919   PLT 364 05/28/2019 0926   MCV 89.4 05/25/2022 0919   MCV 87 05/28/2019 0926   MCH 29.7 12/18/2019 0922   MCHC 33.1 05/25/2022 0919   RDW 13.4 05/25/2022 0919   RDW 13.3 05/28/2019 0926   LYMPHSABS 2.9 05/25/2022 0919   LYMPHSABS 2.6 05/28/2019 0926   MONOABS 0.6 05/25/2022 0919   EOSABS 0.3 05/25/2022 0919   EOSABS 0.3 05/28/2019 0926   BASOSABS 0.0 05/25/2022 0919   BASOSABS 0.1 05/28/2019 0926    CMP     Component Value Date/Time   NA 141 05/25/2022 0919   NA 141 05/28/2019 0926  K 4.0 05/25/2022 0919   CL 104 05/25/2022 0919   CO2 25 05/25/2022 0919   GLUCOSE 113 (H) 05/25/2022 0919   BUN 14 05/25/2022 0919   BUN 10 05/28/2019 0926   CREATININE 0.67 05/25/2022 0919   CREATININE 0.56 12/18/2019 0922   CALCIUM 9.7 05/25/2022 0919   PROT 7.4 05/25/2022 0919   PROT 7.6 05/28/2019 0926   ALBUMIN 4.2 05/25/2022 0919   ALBUMIN 4.7 05/28/2019 0926   AST 17 05/25/2022 0919   ALT 22 05/25/2022 0919   ALKPHOS 102 05/25/2022 0919   BILITOT 0.4 05/25/2022 0919   BILITOT 0.2 05/28/2019 0926   GFRNONAA 90 05/28/2019 0926   GFRAA 104 05/28/2019 0926     Doree AlbeeAmanda R Tashica Provencio, PA-C 9:06 AM

## 2022-07-10 ENCOUNTER — Encounter: Payer: Self-pay | Admitting: *Deleted

## 2022-07-10 ENCOUNTER — Encounter: Payer: Self-pay | Admitting: Physician Assistant

## 2022-07-10 ENCOUNTER — Ambulatory Visit: Payer: Medicare Other | Admitting: Physician Assistant

## 2022-07-10 VITALS — BP 110/68 | HR 100 | Ht 62.0 in | Wt 176.6 lb

## 2022-07-10 DIAGNOSIS — K760 Fatty (change of) liver, not elsewhere classified: Secondary | ICD-10-CM | POA: Diagnosis not present

## 2022-07-10 DIAGNOSIS — R1011 Right upper quadrant pain: Secondary | ICD-10-CM

## 2022-07-10 DIAGNOSIS — E119 Type 2 diabetes mellitus without complications: Secondary | ICD-10-CM

## 2022-07-10 DIAGNOSIS — D123 Benign neoplasm of transverse colon: Secondary | ICD-10-CM | POA: Diagnosis not present

## 2022-07-10 NOTE — Progress Notes (Signed)
Addendum: Reviewed and agree with assessment and management plan. Concepcion Gillott M, MD  

## 2022-07-10 NOTE — Patient Instructions (Addendum)
You are due for your colonoscopy 03/2023.  If you have symptoms prior to this please let us know so we can add on an upper endoscopy if needed.   I appreciate the  opportunity to care for you  Thank You   [facility]

## 2022-07-13 ENCOUNTER — Telehealth: Payer: Self-pay | Admitting: Cardiology

## 2022-07-13 NOTE — Telephone Encounter (Signed)
Patient would like to see the same Dr as her husband Janet Peters) does. Please advise

## 2022-07-20 DIAGNOSIS — H25811 Combined forms of age-related cataract, right eye: Secondary | ICD-10-CM | POA: Diagnosis not present

## 2022-07-20 DIAGNOSIS — H04123 Dry eye syndrome of bilateral lacrimal glands: Secondary | ICD-10-CM | POA: Diagnosis not present

## 2022-07-20 DIAGNOSIS — H43813 Vitreous degeneration, bilateral: Secondary | ICD-10-CM | POA: Diagnosis not present

## 2022-07-20 DIAGNOSIS — E119 Type 2 diabetes mellitus without complications: Secondary | ICD-10-CM | POA: Diagnosis not present

## 2022-08-24 NOTE — Progress Notes (Signed)
Cardiology Office Note:    Date:  08/25/2022   ID:  [name], DOB 04/14/1950, MRN [id number]  PCP:  Esperanza Richters, PA-C  Cardiologist:  Norman Herrlich, MD    Referring MD: Esperanza Richters, PA-C    ASSESSMENT:    1. Mixed hyperlipidemia   2. Diabetes mellitus due to underlying condition with unspecified complications (HCC)   3. Aortic calcification (HCC)   4. Primary hypertension   5. RBBB    PLAN:    In order of problems listed above:  She continues to do well on lipid-lowering therapy with aortic atherosclerosis and type 2 diabetes I encouraged her after her major meal of the day to walk 15 to 20 minutes which can have a significant effect beneficial and postprandial hyperglycemia She requests screening ultrasound of her carotids and I think we have a program through our practice to do it and I will refer her. Hypertension is well-controlled continue her current ARB blood pressure is at target Stable pattern right bundle branch block   Next appointment: 1 year   Medication Adjustments/Labs and Tests Ordered: Current medicines are reviewed at length with the patient today.  Concerns regarding medicines are outlined above.  Orders Placed This Encounter  Procedures   EKG 12-Lead   No orders of the defined types were placed in this encounter.   Chief Complaint  Patient presents with   Establish Care    History of Present Illness:    Janet Peters is a 72 y.o. female with a hx of aortic atherosclerosis hypertension type 2 diabetes and dyslipidemia last seen 07/04/2021.  Her coronary calcium score was 0  She follows good health habits goes to the gym and exercises and is overall pleased with the quality of her life She does not have any cardiac symptoms of edema shortness of breath chest pain palpitation or syncope  I reviewed her recent labs including lipids and hemoglobin A1c She tolerates her blood lowering therapy without muscle pain or  weakness Her home blood pressures run in the range of 110-120/70 good device with good technique  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   Bradycardia    per pt report   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 04/27/2022   Chest discomfort 11/19/2019   Diabetes mellitus due to underlying condition with unspecified complications (HCC) 01/01/2018   Diabetes mellitus without complication (HCC)    on meds   Hyperlipidemia    on meds   Mixed dyslipidemia 01/01/2018   Obesity (BMI 30.0-34.9) 07/04/2021   Osteopenia 10/2016   T score -1.3 FRAX 3.5%/0.1%   Rash and other nonspecific skin eruption 06/28/2021   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    Past Surgical History:  Procedure Laterality Date   CATARACT EXTRACTION Left    CESAREAN SECTION     x2   CHOLECYSTECTOMY, LAPAROSCOPIC  2010   KNEE ARTHROSCOPY W/ MENISCAL REPAIR Left 2011   PARTIAL HYSTERECTOMY  1988    Current Medications: Current Meds  Medication Sig   atorvastatin (LIPITOR) 10 MG tablet Take 1 tablet (10 mg total) by mouth daily.   Cholecalciferol (VITAMIN D3) 125 MCG (5000 UT) CAPS Take 5,000 Units by mouth daily.  fenofibrate (TRICOR) 48 MG tablet Take 1 tablet (48 mg total) by mouth daily.   fluticasone (FLONASE) 50 MCG/ACT nasal spray SHAKE LIQUID AND USE 2 SPRAYS IN EACH NOSTRIL DAILY (Patient taking differently: Place 2 sprays into both nostrils daily.)   glucose blood (ACCU-CHEK GUIDE) test strip USE TO TEST FOUR TIMES DAILY AS DIRECTED (Patient taking differently: 1 each by Other route as needed for other (Glucose check).)   levocetirizine (XYZAL) 5 MG tablet Take 1 tablet (5 mg total) by mouth every evening.   losartan  (COZAAR) 50 MG tablet TAKE 1 TABLET(50 MG) BY MOUTH DAILY (Patient taking differently: Take 50 mg by mouth daily. TAKE 1 TABLET(50 MG) BY MOUTH DAILY)   MAGNESIUM PO Take 1 tablet by mouth daily.   metFORMIN (GLUCOPHAGE) 500 MG tablet 2 tab in am and 1 tab pm (Patient taking differently: Take 500 mg by mouth See admin instructions. 2 tab in am and 1 tab pm)   NON FORMULARY Take 1 tablet by mouth See admin instructions. Pt taking organic resveration   sitaGLIPtin (JANUVIA) 25 MG tablet Take 1 tablet (25 mg total) by mouth daily.     Allergies:   Patient has no known allergies.   Social History   Socioeconomic History   Marital status: Married    Spouse name: Not on file   Number of children: Not on file   Years of education: Not on file   Highest education level: Not on file  Occupational History   Not on file  Tobacco Use   Smoking status: Never   Smokeless tobacco: Never  Vaping Use   Vaping Use: Never used  Substance and Sexual Activity   Alcohol use: No   Drug use: No   Sexual activity: Yes    Partners: Male    Birth control/protection: Surgical    Comment: 1st intercourse- 23, partners - 2, married- 20 yrs, hysterectomy  Other Topics Concern   Not on file  Social History Narrative   Not on file   Social Determinants of Health   Financial Resource Strain: Not on file  Food Insecurity: Not on file  Transportation Needs: Not on file  Physical Activity: Not on file  Stress: Not on file  Social Connections: Not on file     Family History: The patient's family history includes Heart attack in her maternal grandmother. There is no history of Colon polyps, Colon cancer, Esophageal cancer, Stomach cancer, or Rectal cancer. ROS:   Please see the history of present illness.    All other systems reviewed and are negative.  EKGs/Labs/Other Studies Reviewed:    The following studies were reviewed today:  Cardiac Studies & Procedures     STRESS TESTS  MYOCARDIAL  PERFUSION IMAGING 10/12/2017  Narrative  Nuclear stress EF: 70%.  The study is normal.  This is a low risk study.  There was no ST segment deviation noted during stress.  No T wave inversion was noted during stress.  Low risk stress nuclear study with normal perfusion and normal left ventricular regional and global systolic function.   ECHOCARDIOGRAM  ECHOCARDIOGRAM COMPLETE 09/21/2020  Narrative ECHOCARDIOGRAM REPORT    Patient Name:   RHYANNON BARRINEAU [facility] Date of Exam: 09/21/2020 Medical Rec #:  [id number]             Height:       62.0 in Accession #:    [id number]            Weight:  172.0 lb Date of Birth:  [birth year]             BSA:          1.793 m Patient Age:    72 years              BP:           128/65 mmHg Patient Gender: F                     HR:           63 bpm. Exam Location:  [facility]  Procedure: 2D Echo  Indications:    Pulmonary hypertension I27.2  History:        Patient has prior history of Echocardiogram examinations, most recent 08/28/2016. Arrythmias:Bradycardia; Risk Factors:Diabetes.  Sonographer:    Louie Boston Referring Phys: Rito Ehrlich [facility]  IMPRESSIONS   1. Left ventricular ejection fraction, by estimation, is 60 to 65%. The left ventricle has normal function. The left ventricle has no regional wall motion abnormalities. There is mild concentric left ventricular hypertrophy. Left ventricular diastolic parameters are consistent with Grade I diastolic dysfunction (impaired relaxation). The average left ventricular global longitudinal strain is -6.2 %. The global longitudinal strain is abnormal. 2. Right ventricular systolic function is normal. The right ventricular size is normal. 3. The mitral valve is normal in structure. No evidence of mitral valve regurgitation. No evidence of mitral stenosis. 4. The aortic valve is normal in structure. Aortic valve regurgitation is not visualized. No aortic stenosis is present. 5. The inferior  vena cava is normal in size with greater than 50% respiratory variability, suggesting right atrial pressure of 3 mmHg.  FINDINGS Left Ventricle: Left ventricular ejection fraction, by estimation, is 60 to 65%. The left ventricle has normal function. The left ventricle has no regional wall motion abnormalities. The average left ventricular global longitudinal strain is -6.2 %. The global longitudinal strain is abnormal. The left ventricular internal cavity size was normal in size. There is mild concentric left ventricular hypertrophy. Left ventricular diastolic parameters are consistent with Grade I diastolic dysfunction (impaired relaxation).  Right Ventricle: The right ventricular size is normal. No increase in right ventricular wall thickness. Right ventricular systolic function is normal.  Left Atrium: Left atrial size was normal in size.  Right Atrium: Right atrial size was normal in size.  Pericardium: There is no evidence of pericardial effusion. Presence of pericardial fat pad.  Mitral Valve: The mitral valve is normal in structure. No evidence of mitral valve regurgitation. No evidence of mitral valve stenosis.  Tricuspid Valve: The tricuspid valve is normal in structure. Tricuspid valve regurgitation is not demonstrated. No evidence of tricuspid stenosis.  Aortic Valve: The aortic valve is normal in structure. Aortic valve regurgitation is not visualized. No aortic stenosis is present.  Pulmonic Valve: The pulmonic valve was normal in structure. Pulmonic valve regurgitation is not visualized. No evidence of pulmonic stenosis.  Aorta: The aortic root is normal in size and structure.  Venous: The inferior vena cava is normal in size with greater than 50% respiratory variability, suggesting right atrial pressure of 3 mmHg.  IAS/Shunts: No atrial level shunt detected by color flow Doppler.   LEFT VENTRICLE PLAX 2D LVIDd:         4.00 cm  Diastology LVIDs:         2.60 cm  LV e'  medial:    6.74 cm/s LV PW:  1.10 cm  LV E/e' medial:  8.3 LV IVS:        1.10 cm  LV e' lateral:   9.36 cm/s LVOT diam:     2.00 cm  LV E/e' lateral: 6.0 LV SV:         55 LV SV Index:   31       2D Longitudinal Strain LVOT Area:     3.14 cm 2D Strain GLS Avg:     -6.2 %   RIGHT VENTRICLE            IVC RV S prime:     8.81 cm/s  IVC diam: 1.20 cm TAPSE (M-mode): 1.8 cm  LEFT ATRIUM             Index       RIGHT ATRIUM          Index LA diam:        3.40 cm 1.90 cm/m  RA Area:     6.37 cm LA Vol (A2C):   23.4 ml 13.05 ml/m RA Volume:   8.41 ml  4.69 ml/m LA Vol (A4C):   32.9 ml 18.35 ml/m LA Biplane Vol: 29.6 ml 16.51 ml/m AORTIC VALVE LVOT Vmax:   98.30 cm/s LVOT Vmean:  70.500 cm/s LVOT VTI:    0.176 m  AORTA Ao Root diam: 2.40 cm Ao Asc diam:  2.80 cm Ao Desc diam: 2.00 cm  MITRAL VALVE               TRICUSPID VALVE MV Area (PHT): 6.90 cm    TR Peak grad:   14.9 mmHg MV Decel Time: 110 msec    TR Vmax:        193.00 cm/s MV E velocity: 55.80 cm/s MV A velocity: 97.40 cm/s  SHUNTS MV E/A ratio:  0.57        Systemic VTI:  0.18 m Systemic Diam: 2.00 cm  Kardie Tobb DO Electronically signed by Thomasene Ripple DO Signature Date/Time: 09/21/2020/12:37:15 PM    Final     CT SCANS  CT CARDIAC SCORING (SELF PAY ONLY) 08/26/2020  Addendum 08/26/2020  4:06 PM ADDENDUM REPORT: 08/26/2020 16:04  CLINICAL DATA:  Cardiovascular Disease Risk stratification  EXAM: Coronary Calcium Score  TECHNIQUE: A gated, non-contrast computed tomography scan of the heart was performed using 3mm slice thickness. Axial images were analyzed on a dedicated workstation. Calcium scoring of the coronary arteries was performed using the Agatston method.  FINDINGS: Coronary arteries: Normal origins.  Coronary Calcium Score:  Left main: 0  Left anterior descending artery: 0  Left circumflex artery: 0  Right coronary artery: 0  Total: 0  Percentile: 0  Pericardium:  Normal.  Ascending Aorta: Normal caliber.  Non-cardiac: See separate report from [facility]. Dilated main pulmonary artery (non-contrast) to 30 mm, suggestive of pulmonary hypertension.  IMPRESSION: 1. No detectable coronary calcium.  This is a low risk study. 2. Dilated main pulmonary artery (non-contrast) to 30 mm, suggestive of pulmonary hypertension. 3. Scattered calcifications of the aortic root.  RECOMMENDATIONS: Coronary artery calcium (CAC) score is a strong predictor of incident coronary heart disease (CHD) and provides predictive information beyond traditional risk factors. CAC scoring is reasonable to use in the decision to withhold, postpone, or initiate statin therapy in intermediate-risk or selected borderline-risk asymptomatic adults (age 37-75 years and LDL-C >=70 to <190 mg/dL) who do not have diabetes or established atherosclerotic cardiovascular disease (ASCVD).* In intermediate-risk (10-year ASCVD risk >=7.5% to <20%) adults  or selected borderline-risk (10-year ASCVD risk >=5% to <7.5%) adults in whom a CAC score is measured for the purpose of making a treatment decision the following recommendations have been made:  If CAC=0, it is reasonable to withhold statin therapy and reassess in 5 to 10 years, as long as higher risk conditions are absent (diabetes mellitus, family history of premature CHD in first degree relatives (males <55 years; females <65 years), cigarette smoking, or LDL >=190 mg/dL).  If CAC is 1 to 99, it is reasonable to initiate statin therapy for patients >=32 years of age.  If CAC is >=100 or >=75th percentile, it is reasonable to initiate statin therapy at any age.  Cardiology referral should be considered for patients with CAC scores >=400 or >=75th percentile.  *2018 AHA/ACC/AACVPR/AAPA/ABC/ACPM/ADA/AGS/APhA/ASPC/NLA/PCNA Guideline on the Management of Blood Cholesterol: A Report of the American College of  Cardiology/American Heart Association Task Force on Clinical Practice Guidelines. J Am Coll Cardiol. 2019;73(24):3168-3209.  Zoila Shutter, MD   Electronically Signed By: Chrystie Nose M.D. On: 08/26/2020 16:04  Narrative EXAM: OVER-READ INTERPRETATION  CT CHEST  The following report is an over-read performed by radiologist Dr. Charlett Nose of [facility] on 08/26/2020. This over-read does not include interpretation of cardiac or coronary anatomy or pathology. The coronary calcium score interpretation by the cardiologist is attached.  COMPARISON:  None.  FINDINGS: Vascular: Heart is normal size. Aorta normal caliber. Scattered calcifications in the aortic root.  Mediastinum/Nodes: No adenopathy  Lungs/Pleura: No confluent opacities or effusions.  Upper Abdomen: Imaging into the upper abdomen demonstrates no acute findings.  Musculoskeletal: Chest wall soft tissues are unremarkable. No acute bony abnormality.  IMPRESSION: No acute extra cardiac abnormality.  Scattered punctate calcifications in the aortic root.  Electronically Signed: By: Charlett Nose M.D. On: 08/26/2020 15:08          EKG:  EKG ordered today and personally reviewed.  The ekg ordered today demonstrates sinus rhythm right bundle branch block nonspecific ST-T waves unchanged from June 2022  Recent Labs: 05/25/2022: ALT 22; BUN 14; Creatinine, Ser 0.67; Hemoglobin 13.3; Platelets 328.0; Potassium 4.0; Sodium 141  Recent Lipid Panel    Component Value Date/Time   CHOL 177 05/25/2022 0919   CHOL 174 05/28/2019 0926   TRIG 308.0 (H) 05/25/2022 0919   HDL 50.00 05/25/2022 0919   HDL 53 05/28/2019 0926   CHOLHDL 4 05/25/2022 0919   VLDL 61.6 (H) 05/25/2022 0919   LDLCALC 62 12/28/2021 0931   LDLCALC 84 05/28/2019 0926   LDLDIRECT 92.0 05/25/2022 0919    Physical Exam:    VS:  BP 132/68 (BP Location: Left Arm, Patient Position: Sitting)   Pulse 96   Ht 5\' 2"  (1.575 m)   Wt 179  lb 6.4 oz (81.4 kg)   SpO2 97%   BMI 32.81 kg/m     Wt Readings from Last 3 Encounters:  08/25/22 179 lb 6.4 oz (81.4 kg)  07/10/22 176 lb 9.6 oz (80.1 kg)  05/25/22 180 lb (81.6 kg)     GEN: Appears healthy well nourished, well developed in no acute distress HEENT: Normal NECK: No JVD; No carotid bruits LYMPHATICS: No lymphadenopathy CARDIAC: RRR, no murmurs, rubs, gallops RESPIRATORY:  Clear to auscultation without rales, wheezing or rhonchi  ABDOMEN: Soft, non-tender, non-distended MUSCULOSKELETAL:  No edema; No deformity  SKIN: Warm and dry NEUROLOGIC:  Alert and oriented x 3 PSYCHIATRIC:  Normal affect    Signed, Norman Herrlich, MD  08/25/2022 5:04 PM  [facility] .     F

## 2022-08-25 ENCOUNTER — Ambulatory Visit: Payer: Medicare Other | Attending: Cardiology | Admitting: Cardiology

## 2022-08-25 ENCOUNTER — Encounter: Payer: Self-pay | Admitting: Cardiology

## 2022-08-25 VITALS — BP 132/68 | HR 96 | Ht 62.0 in | Wt 179.4 lb

## 2022-08-25 DIAGNOSIS — Z7984 Long term (current) use of oral hypoglycemic drugs: Secondary | ICD-10-CM

## 2022-08-25 DIAGNOSIS — E782 Mixed hyperlipidemia: Secondary | ICD-10-CM | POA: Diagnosis not present

## 2022-08-25 DIAGNOSIS — I4510 Unspecified right bundle-branch block: Secondary | ICD-10-CM | POA: Diagnosis not present

## 2022-08-25 DIAGNOSIS — I700 Atherosclerosis of aorta: Secondary | ICD-10-CM | POA: Diagnosis not present

## 2022-08-25 DIAGNOSIS — I10 Essential (primary) hypertension: Secondary | ICD-10-CM | POA: Diagnosis not present

## 2022-08-25 DIAGNOSIS — E088 Diabetes mellitus due to underlying condition with unspecified complications: Secondary | ICD-10-CM

## 2022-08-25 NOTE — Patient Instructions (Signed)
Medication Instructions:  Your physician recommends that you continue on your current medications as directed. Please refer to the Current Medication list given to you today.  *If you need a refill on your cardiac medications before your next appointment, please call your pharmacy*   Lab Work: None If you have labs (blood work) drawn today and your tests are completely normal, you will receive your results only by: MyChart Message (if you have MyChart) OR A paper copy in the mail If you have any lab test that is abnormal or we need to change your treatment, we will call you to review the results.   Testing/Procedures: Vascular Screening   Follow-Up: At [facility], you and your health needs are our priority.  As part of our continuing mission to provide you with exceptional heart care, we have created designated Provider Care Teams.  These Care Teams include your primary Cardiologist (physician) and Advanced Practice Providers (APPs -  Physician Assistants and Nurse Practitioners) who all work together to provide you with the care you need, when you need it.  We recommend signing up for the patient portal called "MyChart".  Sign up information is provided on this After Visit Summary.  MyChart is used to connect with patients for Virtual Visits (Telemedicine).  Patients are able to view lab/test results, encounter notes, upcoming appointments, etc.  Non-urgent messages can be sent to your provider as well.   To learn more about what you can do with MyChart, go to [email or web address].    Your next appointment:   1 year(s)  Provider:   Norman Herrlich, MD    Other Instructions None

## 2022-09-05 ENCOUNTER — Other Ambulatory Visit: Payer: Self-pay

## 2022-09-05 ENCOUNTER — Telehealth: Payer: Self-pay | Admitting: Cardiology

## 2022-09-05 DIAGNOSIS — E785 Hyperlipidemia, unspecified: Secondary | ICD-10-CM

## 2022-09-05 DIAGNOSIS — E782 Mixed hyperlipidemia: Secondary | ICD-10-CM

## 2022-09-05 NOTE — Telephone Encounter (Signed)
Husband state wife is suppose to have a procedure done, but there is no order for it. Please advise

## 2022-09-05 NOTE — Telephone Encounter (Signed)
Verified with Domingo Dimes that the patient's Vascuscreening has been scheduled.

## 2022-09-05 NOTE — Telephone Encounter (Signed)
Called Donald Pore the patient's husband and informed him that the order for the vascular screening was ordered and also that [facility] from the front office will be calling him to schedule the appointment. Donald Pore was appreciative for the call and had no further questions at this time.

## 2022-09-08 ENCOUNTER — Ambulatory Visit (INDEPENDENT_AMBULATORY_CARE_PROVIDER_SITE_OTHER): Payer: Medicare Other | Admitting: Medical

## 2022-09-08 VITALS — BP 120/88 | HR 93 | Temp 97.9°F | Resp 18 | Ht 62.0 in | Wt 175.4 lb

## 2022-09-08 DIAGNOSIS — R21 Rash and other nonspecific skin eruption: Secondary | ICD-10-CM | POA: Diagnosis not present

## 2022-09-08 DIAGNOSIS — E118 Type 2 diabetes mellitus with unspecified complications: Secondary | ICD-10-CM

## 2022-09-08 DIAGNOSIS — Z794 Long term (current) use of insulin: Secondary | ICD-10-CM

## 2022-09-08 DIAGNOSIS — I10 Essential (primary) hypertension: Secondary | ICD-10-CM | POA: Diagnosis not present

## 2022-09-08 DIAGNOSIS — E7849 Other hyperlipidemia: Secondary | ICD-10-CM | POA: Diagnosis not present

## 2022-09-08 DIAGNOSIS — R7989 Other specified abnormal findings of blood chemistry: Secondary | ICD-10-CM | POA: Diagnosis not present

## 2022-09-08 LAB — COMPREHENSIVE METABOLIC PANEL
ALT: 19 U/L (ref 0–35)
AST: 17 U/L (ref 0–37)
Albumin: 4.5 g/dL (ref 3.5–5.2)
Alkaline Phosphatase: 84 U/L (ref 39–117)
BUN: 15 mg/dL (ref 6–23)
CO2: 24 mEq/L (ref 19–32)
Calcium: 9.6 mg/dL (ref 8.4–10.5)
Chloride: 105 mEq/L (ref 96–112)
Creatinine, Ser: 0.68 mg/dL (ref 0.40–1.20)
GFR: 87.22 mL/min (ref >60.00)
Glucose, Bld: 120 mg/dL — ABNORMAL HIGH (ref 70–99)
Potassium: 4.1 mEq/L (ref 3.5–5.1)
Sodium: 141 mEq/L (ref 135–145)
Total Bilirubin: 0.4 mg/dL (ref 0.2–1.2)
Total Protein: 7.6 g/dL (ref 6.0–8.3)

## 2022-09-08 LAB — VITAMIN D 25 HYDROXY (VIT D DEFICIENCY, FRACTURES): VITD: 53.81 ng/mL (ref 30.00–100.00)

## 2022-09-08 LAB — LIPID PANEL
Cholesterol: 144 mg/dL (ref 0–200)
HDL: 51.7 mg/dL (ref >39.00)
LDL Cholesterol: 66 mg/dL (ref 0–99)
NonHDL: 92.25
Total CHOL/HDL Ratio: 3
Triglycerides: 129 mg/dL (ref 0.0–149.0)
VLDL: 25.8 mg/dL (ref 0.0–40.0)

## 2022-09-08 LAB — HEMOGLOBIN A1C: Hgb A1c MFr Bld: 6.9 % — ABNORMAL HIGH (ref 4.6–6.5)

## 2022-09-08 MED ORDER — CLOBETASOL PROPIONATE 0.05 % EX CREA: 1.0000 | TOPICAL_CREAM | Freq: Two times a day (BID) | CUTANEOUS | 0 refills | Status: DC

## 2022-09-08 MED ORDER — FENOFIBRATE 48 MG PO TABS: 48.0000 mg | ORAL_TABLET | Freq: Every day | ORAL | 3 refills | Status: DC

## 2022-09-08 MED ORDER — LOSARTAN POTASSIUM 50 MG PO TABS: 50.0000 mg | ORAL_TABLET | Freq: Every day | ORAL | 3 refills | Status: DC

## 2022-09-08 NOTE — Progress Notes (Signed)
   Subjective:    Patient ID: Janet Peters, female    DOB: 1950-07-23, 72 y.o.   MRN: [id number]  HPI  Pt has htn. Pt has most of time close to 115-120/60-70 range at times level in 105/60. Pt has not dizziness. Pt states her bp machine reada accurate as has brought here before and we checked against manual.   Htn- bp well controlled. Continue losartan.   For high cholesterol-atorvastatin.  Pt wants refill of clobetosol that derm rx'd. She is aware high potency. She is aware of side effects. Warned by derm in past.  Pt states had 2nd shingles vaccine.  Diabets- on januvia and metformin   Review of Systems  Constitutional:  Negative for chills, fatigue and fever.  HENT:  Negative for congestion, drooling, ear discharge and ear pain.   Respiratory:  Negative for cough, chest tightness, shortness of breath and wheezing.   Cardiovascular:  Negative for chest pain and palpitations.  Gastrointestinal:  Negative for abdominal distention, abdominal pain, anal bleeding, blood in stool and diarrhea.  Genitourinary:  Negative for dysuria, flank pain, hematuria and urgency.  Musculoskeletal:  Negative for back pain and joint swelling.       1 week of rt si area pain.   Skin:  Negative for rash.  Neurological:  Negative for dizziness, speech difficulty, numbness and headaches.  Hematological:  Negative for adenopathy. Does not bruise/bleed easily.  Psychiatric/Behavioral:  Negative for behavioral problems, decreased concentration, dysphoric mood and sleep disturbance. The patient is not nervous/anxious.        Objective:   Physical Exam  General Mental Status- Alert. General Appearance- Not in acute distress.   Skin General: Color- Normal Color. Moisture- Normal Moisture.  Neck Carotid Arteries- Normal color. Moisture- Normal Moisture. No carotid bruits. No JVD.  Chest and Lung Exam Auscultation: Breath Sounds:-Normal.  Cardiovascular Auscultation:Rythm-  Regular. Murmurs & Other Heart Sounds:Auscultation of the heart reveals- No Murmurs.  Abdomen Inspection:-Inspeection Normal. Palpation/Percussion:Note:No mass. Palpation and Percussion of the abdomen reveal- Non Tender, Non Distended + BS, no rebound or guarding.   Neurologic Cranial Nerve exam:- CN III-XII intact(No nystagmus), symmetric smile. Strength:- 5/5 equal and symmetric strength both upper and lower extremities.       Assessment & Plan:   Patient Instructions  1. Type 2 diabetes mellitus with complication, with long-term current use of insulin (HCC) On januvia and metformin. - Hemoglobin A1c  2. Hypertension, unspecified type Continue losartan. If bp levels lower when you check as discussed will reduce dose to 25 mg daily. Continue 50 mg daily. - Comp Met (CMET)  3. Other hyperlipidemia Continue atrovastatin and check leve. - Lipid panel  4. Low vitamin D level - VITAMIN D 25 Hydroxy (Vit-D Deficiency, Fractures)  5. Rash and other nonspecific skin eruption  Clobestal refill. Rx advisement.  Rt side sciatica- recommend combination tylenol 500 mg with low dose ibuprofen 200-400 mg every 8 hour. If pain persist by 7-10 days refer to sports med MD.  Follow up 3 months or sooner if needed.    Esperanza Richters, PA-C

## 2022-09-08 NOTE — Patient Instructions (Addendum)
1. Type 2 diabetes mellitus with complication, with long-term current use of insulin (HCC) On januvia and metformin. - Hemoglobin A1c  2. Hypertension, unspecified type Continue losartan. If bp levels lower when you check as discussed will reduce dose to 25 mg daily. Continue 50 mg daily. - Comp Met (CMET)  3. Other hyperlipidemia Continue atrovastatin and check leve. - Lipid panel  4. Low vitamin D level - VITAMIN D 25 Hydroxy (Vit-D Deficiency, Fractures)  5. Rash and other nonspecific skin eruption  Clobestal refill. Rx advisement.  Rt side sciatica- recommend combination tylenol 500 mg with low dose ibuprofen 200-400 mg every 8 hour. If pain persist by 7-10 days refer to sports med MD. Reviewed 2021 xray l spine  Salon pas patches-daily  Follow up 3 months or sooner if needed.

## 2022-09-09 ENCOUNTER — Other Ambulatory Visit: Payer: Self-pay | Admitting: Cardiology

## 2022-09-18 ENCOUNTER — Other Ambulatory Visit: Payer: Self-pay | Admitting: Medical

## 2022-09-27 ENCOUNTER — Other Ambulatory Visit: Payer: Self-pay | Admitting: Medical

## 2022-10-02 ENCOUNTER — Other Ambulatory Visit: Payer: Self-pay | Admitting: Cardiology

## 2022-10-04 ENCOUNTER — Ambulatory Visit: Payer: Medicare Other | Attending: Cardiology

## 2022-10-04 DIAGNOSIS — E782 Mixed hyperlipidemia: Secondary | ICD-10-CM

## 2022-10-04 DIAGNOSIS — E785 Hyperlipidemia, unspecified: Secondary | ICD-10-CM

## 2022-10-10 ENCOUNTER — Telehealth: Payer: Self-pay

## 2022-10-10 NOTE — Telephone Encounter (Signed)
-----   Message from Nurse Fanny Bien sent at 10/10/2022 12:34 PM EDT -----  ----- Message ----- From: Eleonore Chiquito, RN Sent: 10/09/2022   3:12 PM EDT To: Samson Frederic, RN   ----- Message ----- From: Baldo Daub, MD Sent: 10/09/2022   2:43 PM EDT To: Mickie Bail Ash/Hp Triage  Normal or stable result

## 2022-10-10 NOTE — Telephone Encounter (Signed)
Unable to reach or LM ?

## 2022-10-12 ENCOUNTER — Telehealth: Payer: Self-pay

## 2022-10-12 NOTE — Telephone Encounter (Signed)
-----   Message from Nurse Fanny Bien sent at 10/10/2022 12:34 PM EDT -----  ----- Message ----- From: Eleonore Chiquito, RN Sent: 10/09/2022   3:12 PM EDT To: Samson Frederic, RN   ----- Message ----- From: Baldo Daub, MD Sent: 10/09/2022   2:43 PM EDT To: Mickie Bail Ash/Hp Triage  Normal or stable result

## 2022-10-12 NOTE — Telephone Encounter (Signed)
Patient notified through [facility] in spanish

## 2022-10-16 ENCOUNTER — Encounter (HOSPITAL_COMMUNITY): Payer: Self-pay | Admitting: Cardiology

## 2022-11-29 ENCOUNTER — Encounter: Payer: Self-pay | Admitting: Sports Medicine

## 2022-11-29 ENCOUNTER — Ambulatory Visit: Payer: Medicare Other | Admitting: Sports Medicine

## 2022-11-29 VITALS — BP 120/64 | Ht 62.0 in | Wt 175.0 lb

## 2022-11-29 DIAGNOSIS — M25511 Pain in right shoulder: Secondary | ICD-10-CM | POA: Diagnosis not present

## 2022-11-29 MED ORDER — MELOXICAM 15 MG PO TABS: ORAL_TABLET | ORAL | 0 refills | Status: DC

## 2022-11-29 NOTE — Progress Notes (Signed)
   Subjective:    Patient ID: Janet Peters, female    DOB: [birth year], 72 y.o.   MRN: [id number]  HPI chief complaint: Shoulder pain  Patient is a very pleasant right-hand-dominant 72 year old female that presents today complaining of 2 weeks of lateral shoulder and right sided neck pain.  Pain began about 2 weeks ago without any known trauma.  She had a similar episode about 4 years ago.  Symptoms resolved without surgery.  Her pain is intermittent and not associated with any specific activity.  It is most noticeable when turning her head to the right.  She denies numbness or tingling.  Pain will radiate to the elbow but not past the elbow.  She has not noticed any weakness.  She remains very active and enjoys going to the gym.  She denies any pain with specific upper body resistance exercises.  She has not tried any medications.  She has tried ice but it has been ineffective.  Past medical history reviewed Medications reviewed Allergies reviewed    Review of Systems As above    Objective:   Physical Exam  Well-developed, well-nourished.  No acute distress.  Sitting comfortably in the exam room  Cervical spine: Good cervical range of motion but there is reproducible pain with cervical rotation to the right.  Slight tenderness to palpation along the right trapezius.  Right shoulder: Full painless shoulder range of motion.  Rotator cuff strength is 5/5 and does not reproduce pain.      Assessment & Plan:   Right-sided neck and shoulder pain likely secondary to cervical degenerative disc disease  We discussed treatment options including oral NSAIDs and formal physical therapy.  Patient would like to try oral NSAIDs first.  I will prescribe meloxicam 15 mg daily for 5 days then as needed.  I recommended that she try heat instead of ice.  If symptoms persist then we will reconsider formal physical therapy.  Follow-up for ongoing or recalcitrant issues.  This note was dictated  using Dragon naturally speaking software and may contain errors in syntax, spelling, or content which have not been identified prior to signing this note.

## 2022-12-06 ENCOUNTER — Ambulatory Visit: Payer: Medicare Other | Admitting: Obstetrics & Gynecology

## 2022-12-18 DIAGNOSIS — Z79899 Other long term (current) drug therapy: Secondary | ICD-10-CM | POA: Diagnosis not present

## 2022-12-18 DIAGNOSIS — Z7984 Long term (current) use of oral hypoglycemic drugs: Secondary | ICD-10-CM | POA: Diagnosis not present

## 2022-12-18 DIAGNOSIS — M5441 Lumbago with sciatica, right side: Secondary | ICD-10-CM | POA: Diagnosis not present

## 2022-12-19 NOTE — Progress Notes (Unsigned)
Established patient visit   Patient: Janet Peters   DOB: 1950/07/27   72 y.o. Female  MRN: [id number] Visit Date: 12/20/2022  Today's healthcare provider: Alfredia Ferguson, PA-C   No chief complaint on file.  Subjective    HPI   Pt reports today for an ED f/u, she was seen 9/23 for lower back and radiating right hip pain. She was given toradol injection, norco, morphine and fenatyl ?  Medications: Outpatient Medications Prior to Visit  Medication Sig   ACCU-CHEK GUIDE test strip USE TO TEST BLOOD GLUCOSE FOUR TIMES DAILY AS DIRECTED   atorvastatin (LIPITOR) 10 MG tablet TAKE 1 TABLET(10 MG) BY MOUTH DAILY   Cholecalciferol (VITAMIN D3) 125 MCG (5000 UT) CAPS Take 5,000 Units by mouth daily.   clobetasol cream (TEMOVATE) 0.05 % Apply 1 Application topically 2 (two) times daily. Apply twice daily as needed.   fenofibrate (TRICOR) 48 MG tablet Take 1 tablet (48 mg total) by mouth daily.   fluticasone (FLONASE) 50 MCG/ACT nasal spray SHAKE LIQUID AND USE 2 SPRAYS IN EACH NOSTRIL DAILY (Patient taking differently: Place 2 sprays into both nostrils daily.)   JANUVIA 25 MG tablet TAKE 1 TABLET(25 MG) BY MOUTH DAILY   levocetirizine (XYZAL) 5 MG tablet Take 1 tablet (5 mg total) by mouth every evening.   losartan (COZAAR) 50 MG tablet Take 1 tablet (50 mg total) by mouth daily. TAKE 1 TABLET(50 MG) BY MOUTH DAILY   MAGNESIUM PO Take 1 tablet by mouth daily.   meloxicam (MOBIC) 15 MG tablet Take 1 by mouth daily with food for 5 days then as needed.   metFORMIN (GLUCOPHAGE) 500 MG tablet 2 tab in am and 1 tab pm (Patient taking differently: Take 500 mg by mouth See admin instructions. 2 tab in am and 1 tab pm)   NON FORMULARY Take 1 tablet by mouth See admin instructions. Pt taking organic resveration   No facility-administered medications prior to visit.    Review of Systems    Objective    There were no vitals taken for this visit.  Physical Exam  ***  No results  found for any visits on 12/20/22.  Assessment & Plan     ***  No follow-ups on file.      {provider attestation***:1}   Alfredia Ferguson, PA-C  [facility] [phone] (phone) [phone] (fax)  [facility]

## 2022-12-20 ENCOUNTER — Encounter: Payer: Self-pay | Admitting: Physician Assistant

## 2022-12-20 ENCOUNTER — Ambulatory Visit (INDEPENDENT_AMBULATORY_CARE_PROVIDER_SITE_OTHER): Payer: Medicare Other | Admitting: Physician Assistant

## 2022-12-20 VITALS — BP 146/68 | HR 86 | Temp 97.8°F | Resp 16 | Wt 176.0 lb

## 2022-12-20 DIAGNOSIS — M5441 Lumbago with sciatica, right side: Secondary | ICD-10-CM

## 2022-12-20 MED ORDER — GABAPENTIN 100 MG PO CAPS
ORAL_CAPSULE | ORAL | 0 refills | Status: DC
Start: 2022-12-20 — End: 2023-08-23

## 2022-12-21 ENCOUNTER — Telehealth: Payer: Self-pay | Admitting: Medical

## 2022-12-21 NOTE — Telephone Encounter (Signed)
Pt called & stated that he called [facility] and they stated that they're waiting for a call about the medication. Please advise.

## 2022-12-21 NOTE — Telephone Encounter (Signed)
Will wait for fax.

## 2022-12-21 NOTE — Telephone Encounter (Signed)
Pt's husband called to advise that there was confusion on the instructions for gabapentin and the pharmacy was waiting on clarification. He said the pharmacist was going to fax [place] something. Please call pharmacy or send updated script so they know which instructions to follow.

## 2022-12-22 ENCOUNTER — Telehealth: Payer: Self-pay | Admitting: Medical

## 2022-12-22 NOTE — Telephone Encounter (Signed)
LM on prescriber line at [facility]- informed that we haven't received fax or call from them for medication clarification for her gabapentin but asking for call back when they open if they need clarification.

## 2022-12-22 NOTE — Telephone Encounter (Signed)
Pharmacy called with clarification

## 2022-12-22 NOTE — Telephone Encounter (Signed)
Pt came in on Wednesday for Sciatic pain and saw PCP Alfredia Ferguson - pt was suppose to pick up rx for gabapentin (NEURONTIN) 100 MG capsule but pharmacy does not want to give meds until clarification on how many tablets pt needs to take per day. Pt states pharmacy had send in fax requesting clarification on rx for them to dispatch pt's meds. Pt states is in a lot of pain and wanting to get her meds ASAP. Please advise. Pt tel [phone].   Pharmacy is [id number] - [place], [place] - [address] AT [facility] [address], [place] [address] Phone: [phone]  Fax: [phone] DEA #: [id number]

## 2022-12-22 NOTE — Telephone Encounter (Signed)
Pharmacy has been called with clarification. She should be able to pick up tonight.

## 2022-12-26 ENCOUNTER — Encounter: Payer: Self-pay | Admitting: Sports Medicine

## 2022-12-26 ENCOUNTER — Ambulatory Visit: Payer: Medicare Other | Admitting: Sports Medicine

## 2022-12-26 VITALS — BP 130/72 | Ht 62.0 in | Wt 176.0 lb

## 2022-12-26 DIAGNOSIS — M5410 Radiculopathy, site unspecified: Secondary | ICD-10-CM | POA: Diagnosis not present

## 2022-12-26 MED ORDER — MELOXICAM 15 MG PO TABS: ORAL_TABLET | ORAL | 0 refills | Status: DC

## 2022-12-26 NOTE — Progress Notes (Signed)
   Subjective:    Patient ID: Janet Peters, female    DOB: [birth year], 72 y.o.   MRN: [id number]  HPI chief complaint: Right leg pain and hip pain  Patient is a very pleasant 72 year old female that presents today complaining of little over a week of right sided hip and leg pain.  Pain began gradually but became quite severe.  She sought treatment in the emergency room on September 23.  She was diagnosed with sciatica and given a Toradol injection.  She was discharged home on hydrocodone.  These medications have not been helpful.  Pain in the hip has improved but she has persistent pain in the lateral right leg.  Her foot will also go numb.  She denies any previous episodes.  Please note that the history is obtained with the help of an interpreter.  Past medical history reviewed Medications reviewed Allergies reviewed    Review of Systems As above    Objective:   Physical Exam  Well-developed, well-nourished.  No acute distress  Neurological exam shows strength to be 5/5.  Reflexes are equal at the patellar and Achilles reflexes bilaterally.  No atrophy.  Negative straight leg raise on the right.      Assessment & Plan:   Right leg pain secondary to lumbar radiculopathy  Patient will discontinue her medications that she received in the emergency room since they are not beneficial.  We will instead try Mobic 15 mg daily for the next 7 days.  We will also start physical therapy.  She will follow-up with me again in 3 weeks.  If still symptomatic, consider imaging at that time.  This note was dictated using Dragon naturally speaking software and may contain errors in syntax, spelling, or content which have not been identified prior to signing this note.

## 2022-12-28 NOTE — Therapy (Signed)
OUTPATIENT PHYSICAL THERAPY THORACOLUMBAR EVALUATION   Patient Name: Janet Peters MRN: [id number] DOB:14-Jan-1951, 72 y.o., female Today's Date: 01/03/2023  END OF SESSION:  PT End of Session - 01/03/23 0932     Visit Number 1    Date for PT Re-Evaluation 02/28/23    Authorization Type [employer] Medicare    Authorization Time Period 01/03/23 - 02/28/23 Berkley Harvey pending)    Authorization - Visit Number 1    PT Start Time 0932    PT Stop Time 1050    PT Time Calculation (min) 78 min    Activity Tolerance Patient tolerated treatment well;Patient limited by pain    Behavior During Therapy [facility] for tasks assessed/performed;Anxious             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   Bradycardia    per pt report   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 04/27/2022   Chest discomfort 11/19/2019   Diabetes mellitus due to underlying condition with unspecified complications (HCC) 01/01/2018   Diabetes mellitus without complication (HCC)    on meds   Hyperlipidemia    on meds   Mixed dyslipidemia 01/01/2018   Obesity (BMI 30.0-34.9) 07/04/2021   Osteopenia 10/2016   T score -1.3 FRAX 3.5%/0.1%   Rash and other nonspecific skin eruption 06/28/2021   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   Past Surgical History:  Procedure Laterality Date   CATARACT EXTRACTION Left    CESAREAN SECTION     x2   CHOLECYSTECTOMY, LAPAROSCOPIC  2010   KNEE ARTHROSCOPY W/ MENISCAL REPAIR Left 2011   PARTIAL HYSTERECTOMY  1988   Patient Active Problem List   Diagnosis Date Noted   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 04/27/2022   Aortic calcification (HCC) 07/04/2021   Obesity (BMI 30.0-34.9)  07/04/2021   Rash and other nonspecific skin eruption 06/28/2021   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   Allergic rhinitis due to pollen 02/23/2021   Allergy    Arthritis    Bradycardia    Diabetes mellitus without complication (HCC)    Hyperlipidemia    Chest discomfort 11/19/2019   Mixed dyslipidemia 01/01/2018   Diabetes mellitus due to underlying condition with unspecified complications (HCC) 01/01/2018   Age-related nuclear cataract of left eye 04/24/2017   Osteopenia 10/2016    PCP: Esperanza Richters, PA-C   REFERRING PROVIDER: Ralene Cork, DO   REFERRING DIAG: M54.10 (ICD-10-CM) - Radiculopathy of leg  THERAPY DIAG:  Radiculopathy, lumbosacral region  Other muscle spasm  Muscle weakness (generalized)  Other abnormalities of gait and mobility  RATIONALE FOR EVALUATION AND TREATMENT: Rehabilitation  ONSET DATE:  Mid Sept 2024  NEXT MD VISIT: 01/17/23   SUBJECTIVE:  SUBJECTIVE STATEMENT: Via video interpreter, pt reports she has being experiencing radicular pain from her R buttock down to her R ankle ankle for the past 3 weeks, with intermittent numbness and tingling in R foot/toes. Went to ED and given meds which do not help. She has had radicular pain years ago but less intense which resolved on its own.  Current pain prevents her from sleeping.  PAIN: Are you having pain? Yes: NPRS scale: 10-12/10 Pain location: R buttock down R LE to ankle Pain description: "very strong" burning esp at knee and ankle,  numbness and tingling in lower leg and toes Aggravating factors: walking, prolonged standing  Relieving factors: nothing - no benefit from meds, topical analgesics, ice/heat or TENS  PERTINENT  HISTORY:  Arthritis - B knees, osteopenia, bilateral SI joint dysfunction, DM, obesity  PRECAUTIONS: None  RED FLAGS: None  WEIGHT BEARING RESTRICTIONS: No  FALLS:  Has patient fallen in last 6 months? Yes. Number of falls 1  LIVING ENVIRONMENT: Lives with: lives with their spouse and lives with their daughter Lives in: House/apartment Stairs: Yes: External: 15 steps; bilateral but cannot reach both Has following equipment at home: None  OCCUPATION: Retired  PLOF: Independent and Leisure: gym 4x/wk - walking, bike, [employer] machines   PATIENT GOALS: "To take this pain off so I can sleep."   OBJECTIVE: (objective measures completed at initial evaluation unless otherwise dated)  DIAGNOSTIC FINDINGS:  No recent imaging available for lumbar spine.  03/12/21 - MR thoracic spine: IMPRESSION: Mild multilevel foraminal stenosis in the lower thoracic spine without significant canal stenosis.  02/23/21 - XR thoracic spine: IMPRESSION:  1.  No acute displaced fracture.  2.  Mild multilevel degenerative disc disease.  3.  Mild degenerative changes of the acromioclavicular joints.  4.  Cholecystectomy clips project over the right upper quadrant.   PATIENT SURVEYS:  Modified Oswestry 32 / 50 = 64.0 %  LEFS 45 / 80 = 56.3 %  SCREENING FOR RED FLAGS: Bowel or bladder incontinence: No Spinal tumors: No Cauda equina syndrome: No Compression fracture: No Abdominal aneurysm: No  COGNITION:  Overall cognitive status: Within functional limits for tasks assessed    SENSATION: Intermittent numbness in R toes  MUSCLE LENGTH: Hamstrings: Mild tight B ITB: Mild tight B Piriformis: Mod/severe tight B Hip flexors: Mild tight B Quads: Mild tight B Heelcord: NT  POSTURE:  rounded shoulders, forward head, and weight shift left  PALPATION: Increased muscle tension and TTP in R glutes and piriformis  LUMBAR ROM:   Active  Eval  Flexion WFL  Extension WFL  Right lateral  flexion WFL  Left lateral flexion WFL  Right rotation WFL - decreased pain  Left rotation WFL  (Blank rows = not tested)  LOWER EXTREMITY ROM:    B LE grossly WFL other than limited hip ER/IR  LOWER EXTREMITY MMT:    MMT Right eval Left eval  Hip flexion 5 5  Hip extension 4+ 4+  Hip abduction 4 4  Hip adduction 4 4  Hip internal rotation 5 5  Hip external rotation 4 4+  Knee flexion 5 5  Knee extension 5 5  Ankle dorsiflexion 5 5  Ankle plantarflexion    Ankle inversion    Ankle eversion     (Blank rows = not tested)  LUMBAR SPECIAL TESTS:  Straight leg raise test: Negative and Slump test: Negative  GAIT: Distance walked: Clinic distances Assistive device utilized: None Level of assistance: Complete Independence Gait pattern: decreased  stance time- Right, antalgic, and lateral lean- Left   TODAY'S TREATMENT:   01/03/23 - Eval THERAPEUTIC EXERCISE: to improve flexibility, strength and mobility.  Demonstration, verbal and tactile cues throughout for technique.  POE x 2 min Prone press-up x 5 Standing lumbar extension demonstrated but not attempted  Hooklying R figure-4 piriformis stretch with light overpressure x 30" Hooklying R KTOS piriformis stretch with light overpressure x 30" Attempted side-sitting piriformis stretch but deferred d/t poor tolerance  MANUAL THERAPY: To promote normalized muscle tension, improved flexibility, and reduced pain. Skilled palpation and monitoring of soft tissue during DN Trigger Point Dry-Needling  Treatment instructions: Expect mild to moderate muscle soreness. S/S of pneumothorax if dry needled over a lung field, and to seek immediate medical attention should they occur. Patient verbalized understanding of these instructions and education. Patient Consent Given: Yes Education handout provided: Yes (Spanish version) Muscles treated: R lateral glute medius/minimus and medial piriformis Electrical stimulation performed:  No Parameters: N/A Treatment response/outcome: Twitch Response Elicited and Palpable Increase in Muscle Length STM/DTM, manual TPR and pin & stretch to muscles addressed with DN   PATIENT EDUCATION:  Education details: PT eval findings, anticipated POC, initial HEP, role of DN, and DN rational, procedure, outcomes, potential side effects, and recommended post-treatment exercises/activity Person educated: Patient and Spouse Education method: Explanation, Demonstration, Tactile cues, Verbal cues, and Handouts Education comprehension: verbalized understanding, returned demonstration, verbal cues required, tactile cues required, and needs further education  HOME EXERCISE PROGRAM: Access Code: [email or web address] URL: [email or web address] Date: 01/03/2023 Prepared by: Glenetta Hew  Exercises - Static Prone on Elbows  - 2-3 x daily - 7 x weekly - 2 sets - 3 reps - 30 sec hold - Prone Press Up  - 2-3 x daily - 7 x weekly - 2 sets - 10 reps - 2 sec hold - Standing Lumbar Extension at Wall - Forearms  - 2-3 x daily - 7 x weekly - 2 sets - 10 reps - 3-5 sec hold - Standing Lumbar Extension with Counter  - 2-3 x daily - 7 x weekly - 2 sets - 10 reps - 3-5 sec hold - Supine Figure 4 Piriformis Stretch  - 2-3 x daily - 7 x weekly - 3 reps - 30 sec hold - Supine Piriformis Stretch with Foot on Ground  - 2-3 x daily - 7 x weekly - 3 reps - 30 sec hold   ASSESSMENT:  CLINICAL IMPRESSION: Kerby Miosotis Wetsel is a 72 y.o. female who was referred to physical therapy for evaluation and treatment for R LE radiculopathy.  She reports onset of pain ~3 weeks ago without known MOI or triggering event.  Radicular pain is nearly constant and originates in R buttock and extends down posterior lateral leg to R ankle, with intermittent numbness and tingling in R foot/toes, however red flag signs negative.  Her lumbar ROM is essentially WFL, however some proximal LE tightness noted.  Only minor proximal LE  weakness noted.  Slump and SLR test negative however she does note slight improvement in her pain with extension-based exercises - explained concept of centralization of pain and encouraged continued performance of extension motions as continued improvement noted.  Increased muscle tension with significant TTP noted over TPs and R glutes and piriformis.  DN performed after explanation of DN rational, procedures, outcomes and potential side effects and patient verbalized consent. DN produced normal response with good twitches elicited resulting in palpable reduction in pain/ttp and muscle tension. Pt educated to expect mild  to moderate muscle soreness for up to 24-48 hrs and instructed to continue prescribed HEP and current activity level with pt verbalizing understanding of these instructions.  [place] will benefit from skilled PT to address above deficits to improve mobility and activity tolerance with decreased pain interference.   OBJECTIVE IMPAIRMENTS: Abnormal gait, decreased activity tolerance, decreased endurance, decreased knowledge of condition, decreased mobility, difficulty walking, decreased ROM, decreased strength, increased fascial restrictions, impaired perceived functional ability, increased muscle spasms, impaired flexibility, impaired sensation, improper body mechanics, postural dysfunction, and pain.   ACTIVITY LIMITATIONS: carrying, lifting, sitting, standing, squatting, sleeping, stairs, transfers, bed mobility, locomotion level, and caring for others  PARTICIPATION LIMITATIONS: meal prep, cleaning, laundry, driving, community activity, and gym workouts  PERSONAL FACTORS: Past/current experiences, Time since onset of injury/illness/exacerbation, and 3+ comorbidities: Arthritis - B knees, osteopenia, bilateral SI joint dysfunction, DM, obesity  are also affecting patient's functional outcome.   REHAB POTENTIAL: Good  CLINICAL DECISION MAKING: Evolving/moderate complexity  EVALUATION  COMPLEXITY: Moderate   GOALS: Goals reviewed with patient? Yes  SHORT TERM GOALS: Target date: 01/31/2023  Patient will be independent with initial HEP to improve outcomes and carryover.  Baseline: Initial HEP provided on eval Goal status: INITIAL  2.  Patient will report centralization of radicular symptoms.  Baseline: constant R radicular pain from buttock to R ankle, with intermittent numbness and tingling in R foot/toes Goal status: INITIAL  LONG TERM GOALS: Target date: 02/28/2023   Patient will be independent with ongoing/advanced HEP for self-management at home.  Baseline:  Goal status: INITIAL  2.  Patient will report 75% improvement in R LE radicular pain to improve QOL.  Baseline: 10-12/10 Goal status: INITIAL  3.  Patient to demonstrate ability to achieve and maintain good spinal alignment/posturing and body mechanics needed for daily activities. Baseline:  Goal status: INITIAL  4.  Patient will demonstrate improved B proximal LE strength to >/= 4+/5 for improved stability and ease of mobility . Baseline: Refer to above LE MMT table Goal status: INITIAL  5.  Patient will report >/= 54/80 on LEFS to demonstrate improved functional ability.  Baseline: 45 / 80 = 56.3 % Goal status: INITIAL   6. Patient will report </= 52% on Modified Oswestry to demonstrate improved functional ability with decreased pain interference. Baseline: 32 / 50 = 64.0 % Goal status: INITIAL  7.  Patient will report ability to sleep with <25% sleep disturbance due to R LE radicular pain. Baseline: Sleep completely disturbed due to pain Goal status: INITIAL   PLAN:  PT FREQUENCY: 2x/week  PT DURATION: 6-8 weeks  PLANNED INTERVENTIONS: Therapeutic exercises, Therapeutic activity, Neuromuscular re-education, Balance training, Gait training, Patient/Family education, Self Care, Joint mobilization, Stair training, Aquatic Therapy, Dry Needling, Electrical stimulation, Spinal manipulation,  Spinal mobilization, Cryotherapy, Moist heat, Taping, Traction, Ultrasound, Ionotophoresis 4mg /ml Dexamethasone, Manual therapy, and Re-evaluation  PLAN FOR NEXT SESSION: Assess response to DN; review initial HEP; progress lumbopelvic flexibility and strengthening; MT +/- DN to glutes and performance as indicated and benefit noted   Marry Guan, PT 01/03/2023, 1:19 PM 6  Date of referral: 12/26/22 Referring provider: Ralene Cork, DO Referring diagnosis? M54.10 (ICD-10-CM) - Radiculopathy of leg Treatment diagnosis? (if different than referring diagnosis)  Radiculopathy, lumbosacral region  Other muscle spasm  Muscle weakness (generalized)  Other abnormalities of gait and mobility  What was this (referring dx) caused by? Unspecified  Nature of Condition: Initial Onset (within last 3 months)   Laterality: Rt  Current Functional Measure  Score: Back Index 32 / 50 = 64.0 % ; LEFS 45 / 80 = 56.3 %  Objective measurements identify impairments when they are compared to normal values, the uninvolved extremity, and prior level of function.  [x]  Yes  []  No  Objective assessment of functional ability: Severe functional limitations   Briefly describe symptoms:  Onset of R LE radiculopathy pain ~3 weeks ago without known MOI or triggering event.  Radicular pain is nearly constant and originates in R buttock and extends down posterior lateral leg to R ankle, with intermittent numbness and tingling in R foot/toes, however red flag signs negative.  Her lumbar ROM is essentially WFL, however some proximal LE tightness noted.  Only minor proximal LE weakness noted.  Slump and SLR test negative however she does note slight improvement in her pain with extension-based exercises.  Increased muscle tension with significant TTP noted over TPs and R glutes and piriformis.    How did symptoms start: Insidious onset  Average pain intensity:  Last 24 hours: 10-12/10  Past week: 10/10  How often  does the pt experience symptoms? Constantly  How much have the symptoms interfered with usual daily activities? Extremely  How has condition changed since care began at this facility? NA - initial visit  In general, how is the patients overall health? Very Good  Onset date: ~3 weeks ago   Back screening: (When applicable)  Has your back pain spread down your leg(s) at sometime in the last 2 weeks? [x]  Yes   []  No Have you had pain in the shoulder or neck at sometime in the past 2 weeks? []  Yes   [x]  No Have you only walks short distances because of your back pain? [x]  Yes   []  No In the past 2 weeks, heavy dress more slowly than usual because of your back pain? [x]  Yes   []  No Do you think it is not really safe for person with a condition like yours to be physically active? []  Yes   [x]  No Have worrying thoughts been going through your mind a lot of the time? []  Yes   [x]  No Do you feel that your back pain is terrible and it is never going to get any better? [x]  Yes   []  No In general, have you stopped enjoying all the things you usually enjoy? [x]  Yes   []  No Overall, how bothersome has your back pain been in the last 2 weeks? []  Not at all   []  Slightly     []  Moderate   []  Very much     [x]  Extremely

## 2023-01-01 ENCOUNTER — Encounter: Payer: Self-pay | Admitting: Sports Medicine

## 2023-01-02 ENCOUNTER — Other Ambulatory Visit: Payer: Self-pay | Admitting: *Deleted

## 2023-01-02 DIAGNOSIS — M5416 Radiculopathy, lumbar region: Secondary | ICD-10-CM

## 2023-01-03 ENCOUNTER — Ambulatory Visit (HOSPITAL_BASED_OUTPATIENT_CLINIC_OR_DEPARTMENT_OTHER)
Admission: RE | Admit: 2023-01-03 | Discharge: 2023-01-03 | Disposition: A | Discharge status: Home / Self Care | Payer: Medicare Other | Source: Ambulatory Visit | Attending: Sports Medicine | Admitting: Sports Medicine

## 2023-01-03 ENCOUNTER — Other Ambulatory Visit: Payer: Self-pay | Admitting: Sports Medicine

## 2023-01-03 ENCOUNTER — Encounter: Payer: Self-pay | Admitting: Physical Therapy

## 2023-01-03 ENCOUNTER — Encounter: Payer: Self-pay | Admitting: Sports Medicine

## 2023-01-03 ENCOUNTER — Ambulatory Visit: Payer: Medicare Other | Admitting: Sports Medicine

## 2023-01-03 ENCOUNTER — Other Ambulatory Visit: Payer: Self-pay

## 2023-01-03 ENCOUNTER — Ambulatory Visit: Payer: Medicare Other | Attending: Sports Medicine | Admitting: Physical Therapy

## 2023-01-03 VITALS — BP 130/80 | Ht 62.0 in | Wt 176.0 lb

## 2023-01-03 DIAGNOSIS — M5416 Radiculopathy, lumbar region: Secondary | ICD-10-CM

## 2023-01-03 DIAGNOSIS — M51369 Other intervertebral disc degeneration, lumbar region without mention of lumbar back pain or lower extremity pain: Secondary | ICD-10-CM | POA: Diagnosis not present

## 2023-01-03 DIAGNOSIS — M62838 Other muscle spasm: Secondary | ICD-10-CM | POA: Diagnosis not present

## 2023-01-03 DIAGNOSIS — M5410 Radiculopathy, site unspecified: Secondary | ICD-10-CM | POA: Diagnosis not present

## 2023-01-03 DIAGNOSIS — M6281 Muscle weakness (generalized): Secondary | ICD-10-CM | POA: Diagnosis not present

## 2023-01-03 DIAGNOSIS — M47816 Spondylosis without myelopathy or radiculopathy, lumbar region: Secondary | ICD-10-CM | POA: Diagnosis not present

## 2023-01-03 DIAGNOSIS — R2689 Other abnormalities of gait and mobility: Secondary | ICD-10-CM | POA: Insufficient documentation

## 2023-01-03 DIAGNOSIS — M5417 Radiculopathy, lumbosacral region: Secondary | ICD-10-CM | POA: Insufficient documentation

## 2023-01-03 DIAGNOSIS — M25551 Pain in right hip: Secondary | ICD-10-CM | POA: Diagnosis not present

## 2023-01-03 MED ORDER — METHYLPREDNISOLONE ACETATE 40 MG/ML IJ SUSP
80.0000 mg | Freq: Once | INTRAMUSCULAR | Status: AC
Start: 2023-01-03 — End: 2023-01-03
  Administered 2023-01-03: 80 mg via INTRAMUSCULAR

## 2023-01-03 MED ORDER — KETOROLAC TROMETHAMINE 60 MG/2ML IM SOLN
60.0000 mg | Freq: Once | INTRAMUSCULAR | Status: AC
Start: 2023-01-03 — End: 2023-01-03
  Administered 2023-01-03: 60 mg via INTRAMUSCULAR

## 2023-01-03 MED ORDER — TRAMADOL HCL 50 MG PO TABS: 50.0000 mg | ORAL_TABLET | Freq: Three times a day (TID) | ORAL | 0 refills | Status: DC | PRN

## 2023-01-03 MED ORDER — DICLOFENAC SODIUM 75 MG PO TBEC: DELAYED_RELEASE_TABLET | ORAL | 0 refills | Status: DC

## 2023-01-03 NOTE — Progress Notes (Signed)
   Subjective:    Patient ID: Janet Peters, female    DOB: 1951/01/26, 72 y.o.   MRN: [id number]  HPI  Patient presents to clinic today with returning right leg radiculopathy.  Pain is primarily along the lateral aspect of the right lower leg.  It is returned to the intensity that it was at before being seen in the emergency room.  Meloxicam is not beneficial.  She is having difficulty sleeping at night.  We did refer her to [facility] but her appointment is not until November 1.    Review of Systems As above    Objective:   Physical Exam  Well-developed, well-nourished.  Neurological exam: Positive straight leg raise on the right.  Strength is 5/5 in both lower extremities.  Reflexes are trace but equal at the Achilles and patellar tendons.  No atrophy.      Assessment & Plan:   Returning right leg lumbar radiculopathy  80 mg IM Depo-Medrol injection and 60 mg IM Toradol injection today.  She will discontinue her meloxicam and trial Voltaren 75 mg twice daily with food.  She is cautioned about GI upset with this medication and will stop it immediately if she develops this.  We discussed a trial of either Neurontin or tramadol at night to help her sleep.  She has tried Neurontin previously but did not find it helpful.  Therefore, we will try tramadol 50 mg at night.  Patient started physical therapy earlier today and I encouraged her to continue with this.  I will also order a 2 view lumbar spine.  She will keep her appointment with Dr. Christell Constant as scheduled on November 1.  This note was dictated using Dragon naturally speaking software and may contain errors in syntax, spelling, or content which have not been identified prior to signing this note.

## 2023-01-03 NOTE — Addendum Note (Signed)
Addended by: Merrilyn Puma on: 01/03/2023 11:59 AM   Modules accepted: Orders

## 2023-01-09 ENCOUNTER — Ambulatory Visit: Payer: Medicare Other

## 2023-01-09 DIAGNOSIS — M6281 Muscle weakness (generalized): Secondary | ICD-10-CM

## 2023-01-09 DIAGNOSIS — R2689 Other abnormalities of gait and mobility: Secondary | ICD-10-CM | POA: Diagnosis not present

## 2023-01-09 DIAGNOSIS — M5417 Radiculopathy, lumbosacral region: Secondary | ICD-10-CM

## 2023-01-09 DIAGNOSIS — M62838 Other muscle spasm: Secondary | ICD-10-CM

## 2023-01-09 DIAGNOSIS — M5410 Radiculopathy, site unspecified: Secondary | ICD-10-CM | POA: Diagnosis not present

## 2023-01-09 NOTE — Therapy (Signed)
OUTPATIENT PHYSICAL THERAPY THORACOLUMBAR TREATMENT   Patient Name: Janet Peters MRN: [id number] DOB:03-Feb-1951, 72 y.o., female Today's Date: 01/09/2023  END OF SESSION:    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   Bradycardia    per pt report   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 04/27/2022   Chest discomfort 11/19/2019   Diabetes mellitus due to underlying condition with unspecified complications (HCC) 01/01/2018   Diabetes mellitus without complication (HCC)    on meds   Hyperlipidemia    on meds   Mixed dyslipidemia 01/01/2018   Obesity (BMI 30.0-34.9) 07/04/2021   Osteopenia 10/2016   T score -1.3 FRAX 3.5%/0.1%   Rash and other nonspecific skin eruption 06/28/2021   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   Past Surgical History:  Procedure Laterality Date   CATARACT EXTRACTION Left    CESAREAN SECTION     x2   CHOLECYSTECTOMY, LAPAROSCOPIC  2010   KNEE ARTHROSCOPY W/ MENISCAL REPAIR Left 2011   PARTIAL HYSTERECTOMY  1988   Patient Active Problem List   Diagnosis Date Noted   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 04/27/2022   Aortic calcification (HCC) 07/04/2021   Obesity (BMI 30.0-34.9) 07/04/2021   Rash and other nonspecific skin eruption 06/28/2021   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   Allergic rhinitis due to pollen 02/23/2021   Allergy    Arthritis    Bradycardia    Diabetes mellitus without complication (HCC)    Hyperlipidemia    Chest discomfort 11/19/2019   Mixed dyslipidemia 01/01/2018   Diabetes mellitus due to underlying condition with unspecified complications (HCC)  01/01/2018   Age-related nuclear cataract of left eye 04/24/2017   Osteopenia 10/2016    PCP: Esperanza Richters, PA-C   REFERRING PROVIDER: Ralene Cork, DO   REFERRING DIAG: M54.10 (ICD-10-CM) - Radiculopathy of leg  THERAPY DIAG:  Radiculopathy, lumbosacral region  Other muscle spasm  Muscle weakness (generalized)  Other abnormalities of gait and mobility  RATIONALE FOR EVALUATION AND TREATMENT: Rehabilitation  ONSET DATE:  Mid Sept 2024  NEXT MD VISIT: 01/17/23   SUBJECTIVE:  SUBJECTIVE STATEMENT: Via interpreter, her pain is in R buttock and R lateral lower leg.  PAIN: Are you having pain? Yes: NPRS scale: 6/10 Pain location: R buttock down R LE to ankle Pain description: "very strong" burning esp at knee and ankle,  numbness and tingling in lower leg and toes Aggravating factors: walking, prolonged standing  Relieving factors: nothing - no benefit from meds, topical analgesics, ice/heat or TENS  PERTINENT HISTORY:  Arthritis - B knees, osteopenia, bilateral SI joint dysfunction, DM, obesity  PRECAUTIONS: None  RED FLAGS: None  WEIGHT BEARING RESTRICTIONS: No  FALLS:  Has patient fallen in last 6 months? Yes. Number of falls 1  LIVING ENVIRONMENT: Lives with: lives with their spouse and lives with their daughter Lives in: House/apartment Stairs: Yes: External: 15 steps; bilateral but cannot reach both Has following equipment at home: None  OCCUPATION: Retired  PLOF: Independent and Leisure: gym 4x/wk - walking, bike, [employer] machines   PATIENT GOALS: "To take this pain off so I can sleep."   OBJECTIVE: (objective measures completed at initial evaluation unless otherwise dated)  DIAGNOSTIC FINDINGS:  No recent imaging available for lumbar  spine.  03/12/21 - MR thoracic spine: IMPRESSION: Mild multilevel foraminal stenosis in the lower thoracic spine without significant canal stenosis.  02/23/21 - XR thoracic spine: IMPRESSION:  1.  No acute displaced fracture.  2.  Mild multilevel degenerative disc disease.  3.  Mild degenerative changes of the acromioclavicular joints.  4.  Cholecystectomy clips project over the right upper quadrant.   PATIENT SURVEYS:  Modified Oswestry 32 / 50 = 64.0 %  LEFS 45 / 80 = 56.3 %  SCREENING FOR RED FLAGS: Bowel or bladder incontinence: No Spinal tumors: No Cauda equina syndrome: No Compression fracture: No Abdominal aneurysm: No  COGNITION:  Overall cognitive status: Within functional limits for tasks assessed    SENSATION: Intermittent numbness in R toes  MUSCLE LENGTH: Hamstrings: Mild tight B ITB: Mild tight B Piriformis: Mod/severe tight B Hip flexors: Mild tight B Quads: Mild tight B Heelcord: NT  POSTURE:  rounded shoulders, forward head, and weight shift left  PALPATION: Increased muscle tension and TTP in R glutes and piriformis  LUMBAR ROM:   Active  Eval  Flexion WFL  Extension WFL  Right lateral flexion WFL  Left lateral flexion WFL  Right rotation WFL - decreased pain  Left rotation WFL  (Blank rows = not tested)  LOWER EXTREMITY ROM:    B LE grossly WFL other than limited hip ER/IR  LOWER EXTREMITY MMT:    MMT Right eval Left eval  Hip flexion 5 5  Hip extension 4+ 4+  Hip abduction 4 4  Hip adduction 4 4  Hip internal rotation 5 5  Hip external rotation 4 4+  Knee flexion 5 5  Knee extension 5 5  Ankle dorsiflexion 5 5  Ankle plantarflexion    Ankle inversion    Ankle eversion     (Blank rows = not tested)  LUMBAR SPECIAL TESTS:  Straight leg raise test: Negative and Slump test: Negative  GAIT: Distance walked: Clinic distances Assistive device utilized: None Level of assistance: Complete Independence Gait pattern: decreased  stance time- Right, antalgic, and lateral lean- Left   TODAY'S TREATMENT:  01/09/23 Nustep L4x29min Supine SKTC 3x10" bil Seated piriformis stretch 3x10" bil KTOS Seated LAQ with TrA x 10 bil Runner stretch 2x30 sec RLE Standing heel raise and toe raise x 10 bil Standing hip abduction x 10 bil  01/03/23 - Eval THERAPEUTIC EXERCISE: to improve flexibility, strength and mobility.  Demonstration, verbal and tactile cues throughout for technique.  POE x 2 min Prone press-up x 5 Standing lumbar extension demonstrated but not attempted  Hooklying R figure-4 piriformis stretch with light overpressure x 30" Hooklying R KTOS piriformis stretch with light overpressure x 30" Attempted side-sitting piriformis stretch but deferred d/t poor tolerance  MANUAL THERAPY: To promote normalized muscle tension, improved flexibility, and reduced pain. Skilled palpation and monitoring of soft tissue during DN Trigger Point Dry-Needling  Treatment instructions: Expect mild to moderate muscle soreness. S/S of pneumothorax if dry needled over a lung field, and to seek immediate medical attention should they occur. Patient verbalized understanding of these instructions and education. Patient Consent Given: Yes Education handout provided: Yes (Spanish version) Muscles treated: R lateral glute medius/minimus and medial piriformis Electrical stimulation performed: No Parameters: N/A Treatment response/outcome: Twitch Response Elicited and Palpable Increase in Muscle Length STM/DTM, manual TPR and pin & stretch to muscles addressed with DN   PATIENT EDUCATION:  Education details: PT eval findings, anticipated POC, initial HEP, role of DN, and DN rational, procedure, outcomes, potential side effects, and recommended post-treatment exercises/activity Person educated: Patient and Spouse Education method: Explanation, Demonstration, Tactile cues, Verbal cues, and Handouts Education comprehension: verbalized  understanding, returned demonstration, verbal cues required, tactile cues required, and needs further education  HOME EXERCISE PROGRAM: Access Code: [email or web address] URL: [email or web address] Date: 01/09/2023 Prepared by: Verta Ellen  Exercises - Static Prone on Elbows  - 2-3 x daily - 7 x weekly - 2 sets - 3 reps - 30 sec hold - Prone Press Up  - 2-3 x daily - 7 x weekly - 2 sets - 10 reps - 2 sec hold - Standing Lumbar Extension at Wall - Forearms  - 2-3 x daily - 7 x weekly - 2 sets - 10 reps - 3-5 sec hold - Standing Lumbar Extension with Counter  - 2-3 x daily - 7 x weekly - 2 sets - 10 reps - 3-5 sec hold - Supine Figure 4 Piriformis Stretch  - 2-3 x daily - 7 x weekly - 3 reps - 30 sec hold - Supine Piriformis Stretch with Foot on Ground  - 2-3 x daily - 7 x weekly - 3 reps - 30 sec hold - Standing Gastroc Stretch at Counter  - 2 x daily - 7 x weekly - 3 sets - 3 reps - 30 sec hold - Heel Toe Raises with Counter Support  - 1 x daily - 7 x weekly - 2 sets - 10 reps - Standing Hip Abduction with Counter Support  - 1 x daily - 7 x weekly - 2 sets - 10 reps   ASSESSMENT:  CLINICAL IMPRESSION: Intimated gentle exercises to decrease LBP and improve overall mobility. Overall she noted decreased pain from the exercises. Needed cues throughout session for form. Jordin will benefit from skilled PT to address above deficits to improve mobility and activity tolerance with decreased pain interference.   OBJECTIVE IMPAIRMENTS: Abnormal gait, decreased activity tolerance, decreased endurance, decreased knowledge of condition, decreased mobility, difficulty walking, decreased ROM, decreased strength, increased fascial restrictions, impaired perceived functional ability, increased muscle spasms, impaired flexibility, impaired sensation, improper body mechanics, postural dysfunction, and pain.   ACTIVITY LIMITATIONS: carrying, lifting, sitting, standing, squatting, sleeping, stairs,  transfers, bed mobility, locomotion level, and caring for others  PARTICIPATION LIMITATIONS: meal prep, cleaning, laundry, driving, community activity, and gym workouts  PERSONAL FACTORS: Past/current experiences,  Time since onset of injury/illness/exacerbation, and 3+ comorbidities: Arthritis - B knees, osteopenia, bilateral SI joint dysfunction, DM, obesity  are also affecting patient's functional outcome.   REHAB POTENTIAL: Good  CLINICAL DECISION MAKING: Evolving/moderate complexity  EVALUATION COMPLEXITY: Moderate   GOALS: Goals reviewed with patient? Yes  SHORT TERM GOALS: Target date: 01/31/2023  Patient will be independent with initial HEP to improve outcomes and carryover.  Baseline: Initial HEP provided on eval Goal status: INITIAL  2.  Patient will report centralization of radicular symptoms.  Baseline: constant R radicular pain from buttock to R ankle, with intermittent numbness and tingling in R foot/toes Goal status: INITIAL  LONG TERM GOALS: Target date: 02/28/2023   Patient will be independent with ongoing/advanced HEP for self-management at home.  Baseline:  Goal status: INITIAL  2.  Patient will report 75% improvement in R LE radicular pain to improve QOL.  Baseline: 10-12/10 Goal status: INITIAL  3.  Patient to demonstrate ability to achieve and maintain good spinal alignment/posturing and body mechanics needed for daily activities. Baseline:  Goal status: INITIAL  4.  Patient will demonstrate improved B proximal LE strength to >/= 4+/5 for improved stability and ease of mobility . Baseline: Refer to above LE MMT table Goal status: INITIAL  5.  Patient will report >/= 54/80 on LEFS to demonstrate improved functional ability.  Baseline: 45 / 80 = 56.3 % Goal status: INITIAL   6. Patient will report </= 52% on Modified Oswestry to demonstrate improved functional ability with decreased pain interference. Baseline: 32 / 50 = 64.0 % Goal status:  INITIAL  7.  Patient will report ability to sleep with <25% sleep disturbance due to R LE radicular pain. Baseline: Sleep completely disturbed due to pain Goal status: INITIAL   PLAN:  PT FREQUENCY: 2x/week  PT DURATION: 6-8 weeks  PLANNED INTERVENTIONS: Therapeutic exercises, Therapeutic activity, Neuromuscular re-education, Balance training, Gait training, Patient/Family education, Self Care, Joint mobilization, Stair training, Aquatic Therapy, Dry Needling, Electrical stimulation, Spinal manipulation, Spinal mobilization, Cryotherapy, Moist heat, Taping, Traction, Ultrasound, Ionotophoresis 4mg /ml Dexamethasone, Manual therapy, and Re-evaluation  PLAN FOR NEXT SESSION: progress lumbopelvic flexibility and strengthening; MT +/- DN to glutes and performance as indicated and benefit noted   Darleene Cleaver, PTA 01/09/2023, 9:42 AM 6  Date of referral: 12/26/22 Referring provider: Ralene Cork, DO Referring diagnosis? M54.10 (ICD-10-CM) - Radiculopathy of leg Treatment diagnosis? (if different than referring diagnosis)  Radiculopathy, lumbosacral region  Other muscle spasm  Muscle weakness (generalized)  Other abnormalities of gait and mobility  What was this (referring dx) caused by? Unspecified  Nature of Condition: Initial Onset (within last 3 months)   Laterality: Rt  Current Functional Measure Score: Back Index 32 / 50 = 64.0 % ; LEFS 45 / 80 = 56.3 %  Objective measurements identify impairments when they are compared to normal values, the uninvolved extremity, and prior level of function.  [x]  Yes  []  No  Objective assessment of functional ability: Severe functional limitations   Briefly describe symptoms:  Onset of R LE radiculopathy pain ~3 weeks ago without known MOI or triggering event.  Radicular pain is nearly constant and originates in R buttock and extends down posterior lateral leg to R ankle, with intermittent numbness and tingling in R foot/toes,  however red flag signs negative.  Her lumbar ROM is essentially WFL, however some proximal LE tightness noted.  Only minor proximal LE weakness noted.  Slump and SLR test negative however she does note  slight improvement in her pain with extension-based exercises.  Increased muscle tension with significant TTP noted over TPs and R glutes and piriformis.    How did symptoms start: Insidious onset  Average pain intensity:  Last 24 hours: 10-12/10  Past week: 10/10  How often does the pt experience symptoms? Constantly  How much have the symptoms interfered with usual daily activities? Extremely  How has condition changed since care began at this facility? NA - initial visit  In general, how is the patients overall health? Very Good  Onset date: ~3 weeks ago   Back screening: (When applicable)  Has your back pain spread down your leg(s) at sometime in the last 2 weeks? [x]  Yes   []  No Have you had pain in the shoulder or neck at sometime in the past 2 weeks? []  Yes   [x]  No Have you only walks short distances because of your back pain? [x]  Yes   []  No In the past 2 weeks, heavy dress more slowly than usual because of your back pain? [x]  Yes   []  No Do you think it is not really safe for person with a condition like yours to be physically active? []  Yes   [x]  No Have worrying thoughts been going through your mind a lot of the time? []  Yes   [x]  No Do you feel that your back pain is terrible and it is never going to get any better? [x]  Yes   []  No In general, have you stopped enjoying all the things you usually enjoy? [x]  Yes   []  No Overall, how bothersome has your back pain been in the last 2 weeks? []  Not at all   []  Slightly     []  Moderate   []  Very much     [x]  Extremely

## 2023-01-10 ENCOUNTER — Ambulatory Visit (HOSPITAL_BASED_OUTPATIENT_CLINIC_OR_DEPARTMENT_OTHER)
Admission: RE | Admit: 2023-01-10 | Discharge: 2023-01-10 | Disposition: A | Discharge status: Home / Self Care | Payer: Medicare Other | Source: Ambulatory Visit | Attending: Medical | Admitting: Medical

## 2023-01-10 ENCOUNTER — Ambulatory Visit (INDEPENDENT_AMBULATORY_CARE_PROVIDER_SITE_OTHER): Payer: Medicare Other | Admitting: Medical

## 2023-01-10 ENCOUNTER — Encounter: Payer: Self-pay | Admitting: Medical

## 2023-01-10 ENCOUNTER — Encounter: Payer: Self-pay | Admitting: Sports Medicine

## 2023-01-10 VITALS — BP 132/65 | HR 87 | Temp 98.1°F | Resp 18 | Ht 62.0 in | Wt 170.0 lb

## 2023-01-10 DIAGNOSIS — R110 Nausea: Secondary | ICD-10-CM

## 2023-01-10 DIAGNOSIS — M25561 Pain in right knee: Secondary | ICD-10-CM | POA: Insufficient documentation

## 2023-01-10 DIAGNOSIS — G8929 Other chronic pain: Secondary | ICD-10-CM | POA: Diagnosis not present

## 2023-01-10 DIAGNOSIS — E118 Type 2 diabetes mellitus with unspecified complications: Secondary | ICD-10-CM | POA: Diagnosis not present

## 2023-01-10 DIAGNOSIS — Z794 Long term (current) use of insulin: Secondary | ICD-10-CM

## 2023-01-10 DIAGNOSIS — M1711 Unilateral primary osteoarthritis, right knee: Secondary | ICD-10-CM | POA: Diagnosis not present

## 2023-01-10 DIAGNOSIS — M5441 Lumbago with sciatica, right side: Secondary | ICD-10-CM

## 2023-01-10 LAB — HEMOGLOBIN A1C: Hgb A1c MFr Bld: 6.7 % — ABNORMAL HIGH (ref 4.6–6.5)

## 2023-01-10 LAB — COMPREHENSIVE METABOLIC PANEL
ALT: 16 U/L (ref 0–35)
AST: 16 U/L (ref 0–37)
Albumin: 4.6 g/dL (ref 3.5–5.2)
Alkaline Phosphatase: 94 U/L (ref 39–117)
BUN: 20 mg/dL (ref 6–23)
CO2: 23 meq/L (ref 19–32)
Calcium: 9.8 mg/dL (ref 8.4–10.5)
Chloride: 102 meq/L (ref 96–112)
Creatinine, Ser: 0.66 mg/dL (ref 0.40–1.20)
GFR: 87.64 mL/min (ref >60.00)
Glucose, Bld: 128 mg/dL — ABNORMAL HIGH (ref 70–99)
Potassium: 4.1 meq/L (ref 3.5–5.1)
Sodium: 136 meq/L (ref 135–145)
Total Bilirubin: 0.5 mg/dL (ref 0.2–1.2)
Total Protein: 7.7 g/dL (ref 6.0–8.3)

## 2023-01-10 LAB — LIPASE: Lipase: 14 U/L (ref 11.0–59.0)

## 2023-01-10 MED ORDER — PREGABALIN 75 MG PO CAPS: 75.0000 mg | ORAL_CAPSULE | Freq: Two times a day (BID) | ORAL | 0 refills | Status: DC

## 2023-01-10 MED ORDER — METHYLPREDNISOLONE 4 MG PO TABS
ORAL_TABLET | ORAL | 0 refills | Status: DC
Start: 2023-01-10 — End: 2023-02-02

## 2023-01-10 NOTE — Patient Instructions (Addendum)
1. Chronic right-sided low back pain with right-sided sciatica. On review pain as far back as 2021. See xray done at that time. -failed various nsaids, narcotics and med for neuropathic pain. -will rx low dose taper medrol. Recent sugars controlled. Pt is diabetes will get A1c today. Benefit vs risk of medrol discussed.  -lyrica 75 mg bid for 2 weeks. -medrol 4 days taper dose. -advised on low sugar diet while on medrol -advised to stop all meds recently written for back pain. Pt expressed understanding   2. Acute pain of right knee - DG Knee Complete 4 Views Right; Future   Follow up with spine Specialist in Jan 26, 2023. But with me in 2 weeks

## 2023-01-10 NOTE — Progress Notes (Signed)
Subjective:    Patient ID: Janet Peters, female    DOB: 25-Apr-1950, 72 y.o.   MRN: [id number]  HPI  Pt in with recent low back for one month. Pain was at first low level but gradually did increase and became severe. She has some pain that radiated to knee and to foot.   Pt knee pain started around time of her back pain.  Pain onset with no injury.   Pt had pain in the past. On review can see xray done in 2021. Also had one done yesterday.   Yesterday impression below.  IMPRESSION: Progressive degenerative disc disease at L4-5 and L5-S1. No acute findings.    12-18-2022 ED summary. n my evaluation, patient has no significant vital sign abnormalities, and no focal neurological deficits. She has no red flag signs or symptoms pertaining to back pain, and no clinical evidence of SCI or cauda equina syndrome. Based on history and exam, findings are consistent with sciatica/lumbar radiculopathy. Patient was given IV and oral analgesic medications with adequate symptom control. Laboratory and/or advanced imaging studies are not warranted at this time. Patient is appropriate for discharge home with PCP and orthospine follow-up. Strict return precautions regarding this ED visit were discussed.  ED Clinical Impression   1. Sciatica of right side    Med given   ketorolac (TORADOL) injection 15 mg (15 mg intramuscular Given 12/19/22 0232)  HYDROcodone-acetaminophen (NORCO) 5-325 mg per tablet 1 tablet (1 tablet oral Given 12/19/22 0232)  acetaminophen (TYLENOL) tablet 650 mg (650 mg oral Given 12/19/22 0232)  morphine injection 4 mg (4 mg intravenous Given 12/19/22 0315)  fentaNYL (SUBLIMAZE) injection 50 mcg (50 mcg intravenous Given 12/19/22 0415)   Later saw Linday Drubel pac an gave gabapentin 100 mg. She only use for 4 days.  Pt states also tried oxycodone and it did not help. Also tried tramadal and states did not help. Failed diclofenac.  Days later 12-26-2022 saw sport med  MD.  A/P  Patient will discontinue her medications that she received in the emergency room since they are not beneficial. We will instead try Mobic 15 mg daily for the next 7 days. We will also start physical therapy. She will follow-up with me again in 3 weeks. If still symptomatic, consider imaging at that time.   Pt only tried for 4 days and states it is not helping.   No leg weakness, saddle anesthesia or incontence report.  Pt wants knee xray.    Review of Systems  Constitutional:  Negative for chills, fatigue and fever.  Respiratory:  Negative for chest tightness, shortness of breath and wheezing.   Cardiovascular:  Negative for chest pain and palpitations.  Gastrointestinal:  Negative for abdominal pain, constipation, diarrhea, nausea and vomiting.  Genitourinary:  Negative for dysuria and flank pain.  Musculoskeletal:  Positive for back pain. Negative for myalgias and neck pain.  Skin:  Negative for rash.  Neurological:  Negative for dizziness, speech difficulty, weakness and light-headedness.       Radicular pain.   Hematological:  Negative for adenopathy. Does not bruise/bleed easily.  Psychiatric/Behavioral:  Negative for behavioral problems and confusion.     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 condition with unspecified complications (HCC) 01/01/2018   Diabetes mellitus without complication (HCC)    on meds   Hyperlipidemia    on meds   Mixed dyslipidemia 01/01/2018   Obesity (BMI 30.0-34.9) 07/04/2021   Osteopenia 10/2016   T score -1.3 FRAX  3.5%/0.1%   Rash and other nonspecific skin eruption 06/28/2021   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     Social History   Socioeconomic History   Marital status: Married    Spouse name: Not on file   Number of children: Not on file   Years of education: Not on file   Highest education level: Bachelor's degree (e.g., BA, AB, BS)  Occupational History   Not on file  Tobacco Use   Smoking status: Never   Smokeless tobacco: Never  Vaping Use   Vaping status: Never Used  Substance and Sexual Activity   Alcohol use: No   Drug use: No   Sexual activity: Yes    Partners: Male    Birth control/protection: Surgical    Comment: 1st intercourse- 23, partners - 2, married- 20 yrs, hysterectomy  Other Topics Concern   Not on file  Social History Narrative   Not on file   Social Determinants of Health   Financial Resource Strain: Medium Risk (09/05/2022)   Overall Financial Resource Strain (CARDIA)    Difficulty of Paying Living Expenses: Somewhat hard  Food Insecurity: Patient Declined (01/09/2023)   Hunger Vital Sign    Worried About Running Out of Food in the Last Year: Patient declined    Ran Out of Food in the Last Year: Patient declined  Transportation Needs: No Transportation Needs (01/09/2023)   PRAPARE - Administrator, Civil Service (Medical): No    Lack of Transportation (Non-Medical): No  Physical Activity: Sufficiently Active (01/09/2023)   Exercise Vital Sign    Days of Exercise per Week: 4 days    Minutes of Exercise per Session: 70 min  Stress: Patient Declined (01/09/2023)   [employer] of Occupational Health - Occupational Stress Questionnaire    Feeling of Stress : Patient declined  Social Connections: Moderately Integrated (01/09/2023)   Social Connection and Isolation Panel [NHANES]    Frequency of Communication with Friends and Family: More than three times a week    Frequency of Social Gatherings with Friends and  Family: More than three times a week    Attends Religious Services: 1 to 4 times per year    Active Member of [employer] or Organizations: No    Attends Engineer, structural: Not on file    Marital Status: Married  Catering manager Violence: Not on file    Past Surgical History:  Procedure Laterality Date   CATARACT EXTRACTION Left    CESAREAN SECTION     x2   CHOLECYSTECTOMY, LAPAROSCOPIC  2010   KNEE ARTHROSCOPY W/ MENISCAL REPAIR Left 2011   PARTIAL HYSTERECTOMY  1988    Family History  Problem Relation Age of Onset   Heart attack Maternal Grandmother    Colon polyps Neg Hx    Colon cancer Neg Hx    Esophageal cancer Neg Hx    Stomach cancer Neg Hx    Rectal cancer Neg Hx     No Known Allergies  Current Outpatient Medications on File Prior to Visit  Medication Sig  Dispense Refill   ACCU-CHEK GUIDE test strip USE TO TEST BLOOD GLUCOSE FOUR TIMES DAILY AS DIRECTED 200 strip 12   atorvastatin (LIPITOR) 10 MG tablet TAKE 1 TABLET(10 MG) BY MOUTH DAILY 90 tablet 2   Cholecalciferol (VITAMIN D3) 125 MCG (5000 UT) CAPS Take 5,000 Units by mouth daily.     clobetasol cream (TEMOVATE) 0.05 % Apply 1 Application topically 2 (two) times daily. Apply twice daily as needed. 60 g 0   fenofibrate (TRICOR) 48 MG tablet Take 1 tablet (48 mg total) by mouth daily. 90 tablet 3   fluticasone (FLONASE) 50 MCG/ACT nasal spray SHAKE LIQUID AND USE 2 SPRAYS IN EACH NOSTRIL DAILY (Patient taking differently: Place 2 sprays into both nostrils daily.) 48 g 0   gabapentin (NEURONTIN) 100 MG capsule Tome 1 o 2 capsulas por la noche. Si funciona bien, puede tomar 1 o 2 capsulas tres veces al dia. No lo tome con oxicodona. 30 capsule 0   JANUVIA 25 MG tablet TAKE 1 TABLET(25 MG) BY MOUTH DAILY 30 tablet 3   losartan (COZAAR) 50 MG tablet Take 1 tablet (50 mg total) by mouth daily. TAKE 1 TABLET(50 MG) BY MOUTH DAILY 90 tablet 3   MAGNESIUM PO Take 1 tablet by mouth daily.     metFORMIN (GLUCOPHAGE)  500 MG tablet 2 tab in am and 1 tab pm (Patient taking differently: Take 500 mg by mouth See admin instructions. 2 tab in am and 1 tab pm) 270 tablet 3   NON FORMULARY Take 1 tablet by mouth See admin instructions. Pt taking organic resveration     diclofenac (VOLTAREN) 75 MG EC tablet Take 1 tablet twice daily with food as needed for pain. (Patient not taking: Reported on 01/10/2023) 60 tablet 0   levocetirizine (XYZAL) 5 MG tablet Take 1 tablet (5 mg total) by mouth every evening. (Patient not taking: Reported on 01/10/2023) 30 tablet 3   meloxicam (MOBIC) 15 MG tablet Take 1 by mouth daily with food for 7 days then as needed. (Patient not taking: Reported on 01/10/2023) 40 tablet 0   traMADol (ULTRAM) 50 MG tablet Take 1 tablet (50 mg total) by mouth every 8 (eight) hours as needed. (Patient not taking: Reported on 01/10/2023) 15 tablet 0   No current facility-administered medications on file prior to visit.    BP 132/65 (BP Location: Left Arm, Patient Position: Sitting, Cuff Size: Normal)   Pulse 87   Resp 18   Ht 5\' 2"  (1.575 m)   Wt 170 lb (77.1 kg)   SpO2 98%   BMI 31.09 kg/m        Objective:   Physical Exam  General Appearance- Not in acute distress.    Chest and Lung Exam Auscultation: Breath sounds:-Normal. Clear even and unlabored. Adventitious sounds:- No Adventitious sounds.  Cardiovascular Auscultation:Rythm - Regular, rate and rythm. Heart Sounds -Normal heart sounds.  Abdomen Inspection:-Inspection Normal.  Palpation/Perucssion: Palpation and Percussion of the abdomen reveal- Non Tender, No Rebound tenderness, No rigidity(Guarding) and No Palpable abdominal masses.  Liver:-Normal.  Spleen:- Normal.   Back  Rt side para lumbar spine tenderness to palpation and si tender Pain on straight leg lift. Pain on lateral movements and flexion/extension of the spine.  Lower ext neurologic  L5-S1 sensation intact bilaterally. Normal patellar reflexes  bilaterally. No foot drop bilaterally.   Rt knee- pain on range of motion. Moderate crepitus.    Assessment & Plan:   Patient Instructions  1. Chronic right-sided low back  pain with right-sided sciatica. On review pain as far back as 2021. See xray done at that time. -failed various nsaids, narcotics and med for neuropathic pain. -will rx low dose taper medrol. Recent sugars controlled. Pt is diabetes will get A1c today. Benefit vs risk of medrol discussed.  -lyrica 75 mg bid for 2 weeks. -medrol 4 days taper dose. -advised on low sugar diet while on medrol   2. Acute pain of right knee - DG Knee Complete 4 Views Right; Future   Follow up with spine Specialist in Jan 26, 2023. But with me in 2 weeks

## 2023-01-16 ENCOUNTER — Ambulatory Visit: Payer: Medicare Other | Admitting: Physical Therapy

## 2023-01-16 ENCOUNTER — Encounter: Payer: Self-pay | Admitting: Physical Therapy

## 2023-01-16 ENCOUNTER — Ambulatory Visit: Payer: Medicare Other | Admitting: Medical

## 2023-01-16 DIAGNOSIS — R2689 Other abnormalities of gait and mobility: Secondary | ICD-10-CM | POA: Diagnosis not present

## 2023-01-16 DIAGNOSIS — M5410 Radiculopathy, site unspecified: Secondary | ICD-10-CM | POA: Diagnosis not present

## 2023-01-16 DIAGNOSIS — M62838 Other muscle spasm: Secondary | ICD-10-CM | POA: Diagnosis not present

## 2023-01-16 DIAGNOSIS — M5417 Radiculopathy, lumbosacral region: Secondary | ICD-10-CM

## 2023-01-16 DIAGNOSIS — M6281 Muscle weakness (generalized): Secondary | ICD-10-CM | POA: Diagnosis not present

## 2023-01-16 NOTE — Therapy (Signed)
OUTPATIENT PHYSICAL THERAPY TREATMENT   Patient Name: Janet Peters MRN: [id number] DOB:1950-04-18, 72 y.o., female Today's Date: 01/16/2023  END OF SESSION:  PT End of Session - 01/16/23 0935     Visit Number 3    Date for PT Re-Evaluation 02/28/23    Authorization Type [employer]    Authorization Time Period 01/03/23 - 02/28/23    Authorization - Visit Number 3    Authorization - Number of Visits 16    PT Start Time 0935    PT Stop Time 1040    PT Time Calculation (min) 65 min    Activity Tolerance Patient tolerated treatment well    Behavior During Therapy [facility] for tasks assessed/performed;Anxious              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   Bradycardia    per pt report   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 04/27/2022   Chest discomfort 11/19/2019   Diabetes mellitus due to underlying condition with unspecified complications (HCC) 01/01/2018   Diabetes mellitus without complication (HCC)    on meds   Hyperlipidemia    on meds   Mixed dyslipidemia 01/01/2018   Obesity (BMI 30.0-34.9) 07/04/2021   Osteopenia 10/2016   T score -1.3 FRAX 3.5%/0.1%   Rash and other nonspecific skin eruption 06/28/2021   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   Past Surgical History:  Procedure Laterality Date   CATARACT EXTRACTION Left    CESAREAN SECTION     x2   CHOLECYSTECTOMY, LAPAROSCOPIC  2010   KNEE ARTHROSCOPY W/ MENISCAL REPAIR Left 2011   PARTIAL HYSTERECTOMY  1988   Patient Active Problem List   Diagnosis Date Noted   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 04/27/2022   Aortic calcification (HCC) 07/04/2021   Obesity (BMI 30.0-34.9) 07/04/2021    Rash and other nonspecific skin eruption 06/28/2021   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   Allergic rhinitis due to pollen 02/23/2021   Allergy    Arthritis    Bradycardia    Diabetes mellitus without complication (HCC)    Hyperlipidemia    Chest discomfort 11/19/2019   Mixed dyslipidemia 01/01/2018   Diabetes mellitus due to underlying condition with unspecified complications (HCC) 01/01/2018   Age-related nuclear cataract of left eye 04/24/2017   Osteopenia 10/2016    PCP: Esperanza Richters, PA-C   REFERRING PROVIDER: Ralene Cork, DO   REFERRING DIAG: M54.10 (ICD-10-CM) - Radiculopathy of leg  THERAPY DIAG:  Radiculopathy, lumbosacral region  Other muscle spasm  Muscle weakness (generalized)  Other abnormalities of gait and mobility  RATIONALE FOR EVALUATION AND TREATMENT: Rehabilitation  ONSET DATE:  Mid Sept 2024  NEXT MD VISIT: 01/17/23   SUBJECTIVE:  SUBJECTIVE STATEMENT: Via interpreter, her pain remains constant in R lateral lower leg night and day.Marland Kitchen  PAIN: Are you having pain? Yes: NPRS scale: 8/10 Pain location: R lower leg Pain description: "very strong" burning esp at knee and ankle,  numbness and tingling in lower leg and toes Aggravating factors: walking, prolonged standing  Relieving factors: nothing - no benefit from meds, topical analgesics, ice/heat or TENS  PERTINENT HISTORY:  Arthritis - B knees, osteopenia, bilateral SI joint dysfunction, DM, obesity  PRECAUTIONS: None  RED FLAGS: None  WEIGHT BEARING RESTRICTIONS: No  FALLS:  Has patient fallen in last 6 months? Yes. Number of falls 1  LIVING ENVIRONMENT: Lives with: lives with their spouse and lives with their  daughter Lives in: House/apartment Stairs: Yes: External: 15 steps; bilateral but cannot reach both Has following equipment at home: None  OCCUPATION: Retired  PLOF: Independent and Leisure: gym 4x/wk - walking, bike, [employer] machines   PATIENT GOALS: "To take this pain off so I can sleep."   OBJECTIVE: (objective measures completed at initial evaluation unless otherwise dated)  DIAGNOSTIC FINDINGS:  No recent imaging available for lumbar spine.  03/12/21 - MR thoracic spine: IMPRESSION: Mild multilevel foraminal stenosis in the lower thoracic spine without significant canal stenosis.  02/23/21 - XR thoracic spine: IMPRESSION:  1.  No acute displaced fracture.  2.  Mild multilevel degenerative disc disease.  3.  Mild degenerative changes of the acromioclavicular joints.  4.  Cholecystectomy clips project over the right upper quadrant.   PATIENT SURVEYS:  Modified Oswestry 32 / 50 = 64.0 %  LEFS 45 / 80 = 56.3 %  SCREENING FOR RED FLAGS: Bowel or bladder incontinence: No Spinal tumors: No Cauda equina syndrome: No Compression fracture: No Abdominal aneurysm: No  COGNITION:  Overall cognitive status: Within functional limits for tasks assessed    SENSATION: Intermittent numbness in R toes  MUSCLE LENGTH: Hamstrings: Mild tight B ITB: Mild tight B Piriformis: Mod/severe tight B Hip flexors: Mild tight B Quads: Mild tight B Heelcord: NT  POSTURE:  rounded shoulders, forward head, and weight shift left  PALPATION: Increased muscle tension and TTP in R glutes and piriformis  LUMBAR ROM:   Active  Eval  Flexion WFL  Extension WFL  Right lateral flexion WFL  Left lateral flexion WFL  Right rotation WFL - decreased pain  Left rotation WFL  (Blank rows = not tested)  LOWER EXTREMITY ROM:    B LE grossly WFL other than limited hip ER/IR  LOWER EXTREMITY MMT:    MMT Right eval Left eval  Hip flexion 5 5  Hip extension 4+ 4+  Hip abduction 4 4  Hip  adduction 4 4  Hip internal rotation 5 5  Hip external rotation 4 4+  Knee flexion 5 5  Knee extension 5 5  Ankle dorsiflexion 5 5  Ankle plantarflexion    Ankle inversion    Ankle eversion     (Blank rows = not tested)  LUMBAR SPECIAL TESTS:  Straight leg raise test: Negative and Slump test: Negative  GAIT: Distance walked: Clinic distances Assistive device utilized: None Level of assistance: Complete Independence Gait pattern: decreased stance time- Right, antalgic, and lateral lean- Left   TODAY'S TREATMENT:   01/16/23 THERAPEUTIC EXERCISE: to improve flexibility, strength and mobility.  Demonstration, verbal and tactile cues throughout for technique.  NuStep - L4 x 6 min Hooklying R figure-4 piriformis stretch with light overpressure x 30" Hooklying R KTOS piriformis stretch with light  overpressure 2 x 30" - pt reports better stretch Seated R KTOS piriformis stretch 2 x 30" Seated R figure-4 hip hinge piriformis stretch x 30" Standing R runner's gastroc stretch x 30" Seated R sciatic nerve glide x 10 Hooklying R sciatic nerve glide x 10  MANUAL THERAPY: To promote normalized muscle tension, improved flexibility, and reduced pain. Skilled palpation and monitoring of soft tissue during DN Trigger Point Dry-Needling  Treatment instructions: Expect mild to moderate muscle soreness. S/S of pneumothorax if dry needled over a lung field, and to seek immediate medical attention should they occur. Patient verbalized understanding of these instructions and education. Patient Consent Given: Yes Education handout provided: Previously provided Muscles treated: R medial pirifomis Electrical stimulation performed: No Parameters: N/A Treatment response/outcome: Twitch Response Elicited and Palpable Increase in Muscle Length STM/DTM, manual TPR and pin & stretch to muscles addressed with DN Foam roller IASTM to R glutes/piriformis, posterior lateral thigh and lower leg  MODALITIES:   Pre-mod to R glutes/piriformis and R lower leg - intensity to pt tolerance x 15" Moist heat to R buttock x 15"   01/09/23 Nustep L4x86min Supine SKTC 3x10" bil Seated piriformis stretch 3x10" bil KTOS Seated LAQ with TrA x 10 bil Runner stretch 2x30 sec RLE Standing heel raise and toe raise x 10 bil Standing hip abduction x 10 bil   01/03/23 - Eval THERAPEUTIC EXERCISE: to improve flexibility, strength and mobility.  Demonstration, verbal and tactile cues throughout for technique.  POE x 2 min Prone press-up x 5 Standing lumbar extension demonstrated but not attempted  Hooklying R figure-4 piriformis stretch with light overpressure x 30" Hooklying R KTOS piriformis stretch with light overpressure x 30" Attempted side-sitting piriformis stretch but deferred d/t poor tolerance  MANUAL THERAPY: To promote normalized muscle tension, improved flexibility, and reduced pain. Skilled palpation and monitoring of soft tissue during DN Trigger Point Dry-Needling  Treatment instructions: Expect mild to moderate muscle soreness. S/S of pneumothorax if dry needled over a lung field, and to seek immediate medical attention should they occur. Patient verbalized understanding of these instructions and education. Patient Consent Given: Yes Education handout provided: Yes (Spanish version) Muscles treated: R lateral glute medius/minimus and medial piriformis Electrical stimulation performed: No Parameters: N/A Treatment response/outcome: Twitch Response Elicited and Palpable Increase in Muscle Length STM/DTM, manual TPR and pin & stretch to muscles addressed with DN   PATIENT EDUCATION:  Education details: HEP update - Sciatic nerve glides, role of DN, and DN rational, procedure, outcomes, potential side effects, and recommended post-treatment exercises/activity Person educated: Patient Education method: Explanation, Demonstration, Tactile cues, Verbal cues, and Handouts Education comprehension:  verbalized understanding, returned demonstration, verbal cues required, tactile cues required, and needs further education  HOME EXERCISE PROGRAM: Access Code: [email or web address] URL: [email or web address] Date: 01/16/2023 Prepared by: Glenetta Hew  Exercises - Static Prone on Elbows  - 2-3 x daily - 7 x weekly - 2 sets - 3 reps - 30 sec hold - Prone Press Up  - 2-3 x daily - 7 x weekly - 2 sets - 10 reps - 2 sec hold - Standing Lumbar Extension at Wall - Forearms  - 2-3 x daily - 7 x weekly - 2 sets - 10 reps - 3-5 sec hold - Standing Lumbar Extension with Counter  - 2-3 x daily - 7 x weekly - 2 sets - 10 reps - 3-5 sec hold - Supine Figure 4 Piriformis Stretch  - 2-3 x daily - 7 x weekly - 3 reps -  30 sec hold - Supine Piriformis Stretch with Foot on Ground  - 2-3 x daily - 7 x weekly - 3 reps - 30 sec hold - Standing Gastroc Stretch at Counter  - 2 x daily - 7 x weekly - 3 sets - 3 reps - 30 sec hold - Heel Toe Raises with Counter Support  - 1 x daily - 7 x weekly - 2 sets - 10 reps - Standing Hip Abduction with Counter Support  - 1 x daily - 7 x weekly - 2 sets - 10 reps - Seated Slump Nerve Glide  - 1 x daily - 7 x weekly - 2 sets - 10 reps - 3 sec hold - Supine Sciatic Nerve Glide (Mirrored)  - 1 x daily - 7 x weekly - 2 sets - 10 reps - 3 sec hold  Patient Education - TENS Therapy   ASSESSMENT:  CLINICAL IMPRESSION: Janet Peters reports continued constant intense distal LE radicular pain night and day. She reports a little relief from initial DN session but remains very apprehensive about needles. She agreed to another trial of DN with her husband present to hold her hands. DN performed to R medial piriformis followed by MT to R glutes/piriformis and lower leg. MT followed by review of stretches and introduction of sciatic nerve glides with pt noting decrease in pain. Session concluded with trial of estim to R glutes/piriformis and lower leg with benefit noted. Pt reports she has a  TENS unit that she can use at home as well. Pain reduced to 1/10 by end of visit.  Janet Peters will benefit from skilled PT to address above deficits to improve mobility and activity tolerance with decreased pain interference.   OBJECTIVE IMPAIRMENTS: Abnormal gait, decreased activity tolerance, decreased endurance, decreased knowledge of condition, decreased mobility, difficulty walking, decreased ROM, decreased strength, increased fascial restrictions, impaired perceived functional ability, increased muscle spasms, impaired flexibility, impaired sensation, improper body mechanics, postural dysfunction, and pain.   ACTIVITY LIMITATIONS: carrying, lifting, sitting, standing, squatting, sleeping, stairs, transfers, bed mobility, locomotion level, and caring for others  PARTICIPATION LIMITATIONS: meal prep, cleaning, laundry, driving, community activity, and gym workouts  PERSONAL FACTORS: Past/current experiences, Time since onset of injury/illness/exacerbation, and 3+ comorbidities: Arthritis - B knees, osteopenia, bilateral SI joint dysfunction, DM, obesity  are also affecting patient's functional outcome.   REHAB POTENTIAL: Good  CLINICAL DECISION MAKING: Evolving/moderate complexity  EVALUATION COMPLEXITY: Moderate   GOALS: Goals reviewed with patient? Yes  SHORT TERM GOALS: Target date: 01/31/2023  Patient will be independent with initial HEP to improve outcomes and carryover.  Baseline: Initial HEP provided on eval Goal status: IN PROGRESS  2.  Patient will report centralization of radicular symptoms.  Baseline: constant R radicular pain from buttock to R ankle, with intermittent numbness and tingling in R foot/toes Goal status: IN PROGRESS  LONG TERM GOALS: Target date: 02/28/2023   Patient will be independent with ongoing/advanced HEP for self-management at home.  Baseline:  Goal status: IN PROGRESS  2.  Patient will report 75% improvement in R LE radicular pain to improve QOL.   Baseline: 10-12/10 Goal status: IN PROGRESS  3.  Patient to demonstrate ability to achieve and maintain good spinal alignment/posturing and body mechanics needed for daily activities. Baseline:  Goal status: IN PROGRESS  4.  Patient will demonstrate improved B proximal LE strength to >/= 4+/5 for improved stability and ease of mobility . Baseline: Refer to above LE MMT table Goal status: IN PROGRESS  5.  Patient will report >/= 54/80 on LEFS to demonstrate improved functional ability.  Baseline: 45 / 80 = 56.3 % Goal status: IN PROGRESS   6. Patient will report </= 52% on Modified Oswestry to demonstrate improved functional ability with decreased pain interference. Baseline: 32 / 50 = 64.0 % Goal status: IN PROGRESS  7.  Patient will report ability to sleep with <25% sleep disturbance due to R LE radicular pain. Baseline: Sleep completely disturbed due to pain Goal status: IN PROGRESS   PLAN:  PT FREQUENCY: 2x/week  PT DURATION: 6-8 weeks  PLANNED INTERVENTIONS: Therapeutic exercises, Therapeutic activity, Neuromuscular re-education, Balance training, Gait training, Patient/Family education, Self Care, Joint mobilization, Stair training, Aquatic Therapy, Dry Needling, Electrical stimulation, Spinal manipulation, Spinal mobilization, Cryotherapy, Moist heat, Taping, Traction, Ultrasound, Ionotophoresis 4mg /ml Dexamethasone, Manual therapy, and Re-evaluation  PLAN FOR NEXT SESSION: progress lumbopelvic flexibility and strengthening; MT +/- DN to glutes and performance as indicated and benefit noted   Marry Guan, PT 01/16/2023, 10:45 AM 6  Date of referral: 12/26/22 Referring provider: Ralene Cork, DO Referring diagnosis? M54.10 (ICD-10-CM) - Radiculopathy of leg Treatment diagnosis? (if different than referring diagnosis)  Radiculopathy, lumbosacral region  Other muscle spasm  Muscle weakness (generalized)  Other abnormalities of gait and mobility  What was  this (referring dx) caused by? Unspecified  Nature of Condition: Initial Onset (within last 3 months)   Laterality: Rt  Current Functional Measure Score: Back Index 32 / 50 = 64.0 % ; LEFS 45 / 80 = 56.3 %  Objective measurements identify impairments when they are compared to normal values, the uninvolved extremity, and prior level of function.  [x]  Yes  []  No  Objective assessment of functional ability: Severe functional limitations   Briefly describe symptoms:  Onset of R LE radiculopathy pain ~3 weeks ago without known MOI or triggering event.  Radicular pain is nearly constant and originates in R buttock and extends down posterior lateral leg to R ankle, with intermittent numbness and tingling in R foot/toes, however red flag signs negative.  Her lumbar ROM is essentially WFL, however some proximal LE tightness noted.  Only minor proximal LE weakness noted.  Slump and SLR test negative however she does note slight improvement in her pain with extension-based exercises.  Increased muscle tension with significant TTP noted over TPs and R glutes and piriformis.    How did symptoms start: Insidious onset  Average pain intensity:  Last 24 hours: 10-12/10  Past week: 10/10  How often does the pt experience symptoms? Constantly  How much have the symptoms interfered with usual daily activities? Extremely  How has condition changed since care began at this facility? NA - initial visit  In general, how is the patients overall health? Very Good  Onset date: ~3 weeks ago   Back screening: (When applicable)  Has your back pain spread down your leg(s) at sometime in the last 2 weeks? [x]  Yes   []  No Have you had pain in the shoulder or neck at sometime in the past 2 weeks? []  Yes   [x]  No Have you only walks short distances because of your back pain? [x]  Yes   []  No In the past 2 weeks, heavy dress more slowly than usual because of your back pain? [x]  Yes   []  No Do you think it is  not really safe for person with a condition like yours to be physically active? []  Yes   [x]  No Have worrying thoughts been going  through your mind a lot of the time? []  Yes   [x]  No Do you feel that your back pain is terrible and it is never going to get any better? [x]  Yes   []  No In general, have you stopped enjoying all the things you usually enjoy? [x]  Yes   []  No Overall, how bothersome has your back pain been in the last 2 weeks? []  Not at all   []  Slightly     []  Moderate   []  Very much     [x]  Extremely

## 2023-01-17 ENCOUNTER — Ambulatory Visit: Payer: Medicare Other | Admitting: Sports Medicine

## 2023-01-17 ENCOUNTER — Ambulatory Visit: Payer: Medicare Other | Admitting: Medical

## 2023-01-19 ENCOUNTER — Encounter: Payer: Self-pay | Admitting: Physical Therapy

## 2023-01-19 ENCOUNTER — Ambulatory Visit: Payer: Medicare Other | Admitting: Physical Therapy

## 2023-01-19 DIAGNOSIS — R2689 Other abnormalities of gait and mobility: Secondary | ICD-10-CM | POA: Diagnosis not present

## 2023-01-19 DIAGNOSIS — M5410 Radiculopathy, site unspecified: Secondary | ICD-10-CM | POA: Diagnosis not present

## 2023-01-19 DIAGNOSIS — M5417 Radiculopathy, lumbosacral region: Secondary | ICD-10-CM

## 2023-01-19 DIAGNOSIS — M62838 Other muscle spasm: Secondary | ICD-10-CM | POA: Diagnosis not present

## 2023-01-19 DIAGNOSIS — M6281 Muscle weakness (generalized): Secondary | ICD-10-CM

## 2023-01-19 NOTE — Therapy (Signed)
OUTPATIENT PHYSICAL THERAPY TREATMENT   Patient Name: Janet Peters MRN: [id number] DOB:October 16, 1950, 72 y.o., female Today's Date: 01/19/2023  END OF SESSION:  PT End of Session - 01/19/23 1017     Visit Number 4    Date for PT Re-Evaluation 02/28/23    Authorization Type [employer] Medicare    Authorization Time Period 01/03/23 - 02/28/23    Authorization - Visit Number 4    Authorization - Number of Visits 16    PT Start Time 1017    PT Stop Time 1059    PT Time Calculation (min) 42 min    Activity Tolerance Patient tolerated treatment well    Behavior During Therapy WFL for tasks assessed/performed               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   Bradycardia    per pt report   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 04/27/2022   Chest discomfort 11/19/2019   Diabetes mellitus due to underlying condition with unspecified complications (HCC) 01/01/2018   Diabetes mellitus without complication (HCC)    on meds   Hyperlipidemia    on meds   Mixed dyslipidemia 01/01/2018   Obesity (BMI 30.0-34.9) 07/04/2021   Osteopenia 10/2016   T score -1.3 FRAX 3.5%/0.1%   Rash and other nonspecific skin eruption 06/28/2021   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   Past Surgical History:  Procedure Laterality Date   CATARACT EXTRACTION Left    CESAREAN SECTION     x2   CHOLECYSTECTOMY, LAPAROSCOPIC  2010   KNEE ARTHROSCOPY W/ MENISCAL REPAIR Left 2011   PARTIAL HYSTERECTOMY  1988   Patient Active Problem List   Diagnosis Date Noted   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 04/27/2022   Aortic calcification (HCC) 07/04/2021   Obesity (BMI 30.0-34.9) 07/04/2021   Rash and  other nonspecific skin eruption 06/28/2021   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   Allergic rhinitis due to pollen 02/23/2021   Allergy    Arthritis    Bradycardia    Diabetes mellitus without complication (HCC)    Hyperlipidemia    Chest discomfort 11/19/2019   Mixed dyslipidemia 01/01/2018   Diabetes mellitus due to underlying condition with unspecified complications (HCC) 01/01/2018   Age-related nuclear cataract of left eye 04/24/2017   Osteopenia 10/2016    PCP: Esperanza Richters, PA-C   REFERRING PROVIDER: Ralene Cork, DO   REFERRING DIAG: M54.10 (ICD-10-CM) - Radiculopathy of leg  THERAPY DIAG:  Radiculopathy, lumbosacral region  Other muscle spasm  Muscle weakness (generalized)  Other abnormalities of gait and mobility  RATIONALE FOR EVALUATION AND TREATMENT: Rehabilitation  ONSET DATE:  Mid Sept 2024  NEXT MD VISIT: 01/26/23 with Dr. Willia Craze (orthopedics)   SUBJECTIVE:  SUBJECTIVE STATEMENT: Via interpreter, her pain is much better during the day, but still 8/10 at night.  PAIN: Are you having pain? Yes: NPRS scale: 2 currently/during the day, at night 8/10 Pain location: R lower leg Pain description: "very strong" burning esp at knee and ankle,  numbness and tingling in lower leg and toes Aggravating factors: walking, prolonged standing  Relieving factors: nothing - no benefit from meds, topical analgesics, ice/heat or TENS  PERTINENT HISTORY:  Arthritis - B knees, osteopenia, bilateral SI joint dysfunction, DM, obesity  PRECAUTIONS: None  RED FLAGS: None  WEIGHT BEARING RESTRICTIONS: No  FALLS:  Has patient fallen in last 6 months? Yes. Number of falls 1  LIVING  ENVIRONMENT: Lives with: lives with their spouse and lives with their daughter Lives in: House/apartment Stairs: Yes: External: 15 steps; bilateral but cannot reach both Has following equipment at home: None  OCCUPATION: Retired  PLOF: Independent and Leisure: gym 4x/wk - walking, bike, [employer] machines   PATIENT GOALS: "To take this pain off so I can sleep."   OBJECTIVE: (objective measures completed at initial evaluation unless otherwise dated)  DIAGNOSTIC FINDINGS:  No recent imaging available for lumbar spine.  03/12/21 - MR thoracic spine: IMPRESSION: Mild multilevel foraminal stenosis in the lower thoracic spine without significant canal stenosis.  02/23/21 - XR thoracic spine: IMPRESSION:  1.  No acute displaced fracture.  2.  Mild multilevel degenerative disc disease.  3.  Mild degenerative changes of the acromioclavicular joints.  4.  Cholecystectomy clips project over the right upper quadrant.   PATIENT SURVEYS:  Modified Oswestry 32 / 50 = 64.0 %  LEFS 45 / 80 = 56.3 %  SCREENING FOR RED FLAGS: Bowel or bladder incontinence: No Spinal tumors: No Cauda equina syndrome: No Compression fracture: No Abdominal aneurysm: No  COGNITION:  Overall cognitive status: Within functional limits for tasks assessed    SENSATION: Intermittent numbness in R toes  MUSCLE LENGTH: Hamstrings: Mild tight B ITB: Mild tight B Piriformis: Mod/severe tight B Hip flexors: Mild tight B Quads: Mild tight B Heelcord: NT  POSTURE:  rounded shoulders, forward head, and weight shift left  PALPATION: Increased muscle tension and TTP in R glutes and piriformis  LUMBAR ROM:   Active  Eval  Flexion WFL  Extension WFL  Right lateral flexion WFL  Left lateral flexion WFL  Right rotation WFL - decreased pain  Left rotation WFL  (Blank rows = not tested)  LOWER EXTREMITY ROM:    B LE grossly WFL other than limited hip ER/IR  LOWER EXTREMITY MMT:    MMT Right eval Left  eval  Hip flexion 5 5  Hip extension 4+ 4+  Hip abduction 4 4  Hip adduction 4 4  Hip internal rotation 5 5  Hip external rotation 4 4+  Knee flexion 5 5  Knee extension 5 5  Ankle dorsiflexion 5 5  Ankle plantarflexion    Ankle inversion    Ankle eversion     (Blank rows = not tested)  LUMBAR SPECIAL TESTS:  Straight leg raise test: Negative and Slump test: Negative  GAIT: Distance walked: Clinic distances Assistive device utilized: None Level of assistance: Complete Independence Gait pattern: decreased stance time- Right, antalgic, and lateral lean- Left   TODAY'S TREATMENT:   01/19/23 THERAPEUTIC EXERCISE: to improve flexibility, strength and mobility.  Demonstration, verbal and tactile cues throughout for technique.  NuStep - L4 x 6 min Seated R sciatic nerve glide x 10 Hooklying R KTOS  piriformis stretch with light overpressure 2 x 30"  Seated R sciatic nerve glide 3 x 10 Hooklying TrA/PPT 10 x 5" Hooklying TrA/PPT + hip ADD ball squeeze isometric 10 x 5" Bridge + GTB hip ABD isometric 10 x 5" Hooklying TrA/PPT + B GTB hip ABD/ER  10 x 5"   MANUAL THERAPY: To promote normalized muscle tension, improved flexibility, reduced pain, and normalize SIJ alignnment . MET to correct apparent R anterior innominate rotation of pelvis on sacrum followed by pelvic shotgun - 2 cycles - alignment normalized after 2nd set R LE longitudinal distraction between MET sets STM/DTM & manual TPR to R lateral glutes Provided instruction in self-STM techniques to R glutes using tennis ball on wall.    01/16/23 THERAPEUTIC EXERCISE: to improve flexibility, strength and mobility.  Demonstration, verbal and tactile cues throughout for technique.  NuStep - L4 x 6 min Hooklying R figure-4 piriformis stretch with light overpressure x 30" Hooklying R KTOS piriformis stretch with light overpressure 2 x 30" - pt reports better stretch Seated R KTOS piriformis stretch 2 x 30" Seated R figure-4  hip hinge piriformis stretch x 30" Standing R runner's gastroc stretch x 30" Seated R sciatic nerve glide x 10 Hooklying R sciatic nerve glide x 10  MANUAL THERAPY: To promote normalized muscle tension, improved flexibility, and reduced pain. Skilled palpation and monitoring of soft tissue during DN Trigger Point Dry-Needling  Treatment instructions: Expect mild to moderate muscle soreness. S/S of pneumothorax if dry needled over a lung field, and to seek immediate medical attention should they occur. Patient verbalized understanding of these instructions and education. Patient Consent Given: Yes Education handout provided: Previously provided Muscles treated: R medial pirifomis Electrical stimulation performed: No Parameters: N/A Treatment response/outcome: Twitch Response Elicited and Palpable Increase in Muscle Length STM/DTM, manual TPR and pin & stretch to muscles addressed with DN Foam roller IASTM to R glutes/piriformis, posterior lateral thigh and lower leg  MODALITIES:  Pre-mod to R glutes/piriformis and R lower leg - intensity to pt tolerance x 15" Moist heat to R buttock x 15"   01/09/23 Nustep L4x50min Supine SKTC 3x10" bil Seated piriformis stretch 3x10" bil KTOS Seated LAQ with TrA x 10 bil Runner stretch 2x30 sec RLE Standing heel raise and toe raise x 10 bil Standing hip abduction x 10 bil   PATIENT EDUCATION:  Education details: HEP update - lumbopelvic strengthening and self-STM techniques to glutes using ball on wall Person educated: Patient Education method: Explanation, Demonstration, Tactile cues, Verbal cues, and Handouts Education comprehension: verbalized understanding, returned demonstration, verbal cues required, tactile cues required, and needs further education  HOME EXERCISE PROGRAM: Access Code: [email or web address] URL: [email or web address] Date: 01/19/2023 Prepared by: Glenetta Hew  Exercises - Static Prone on Elbows  - 2-3 x daily - 7  x weekly - 2 sets - 3 reps - 30 sec hold - Prone Press Up  - 2-3 x daily - 7 x weekly - 2 sets - 10 reps - 2 sec hold - Standing Lumbar Extension at Wall - Forearms  - 2-3 x daily - 7 x weekly - 2 sets - 10 reps - 3-5 sec hold - Standing Lumbar Extension with Counter  - 2-3 x daily - 7 x weekly - 2 sets - 10 reps - 3-5 sec hold - Supine Figure 4 Piriformis Stretch  - 2-3 x daily - 7 x weekly - 3 reps - 30 sec hold - Supine Piriformis Stretch with Foot on Ground  - 2-3  x daily - 7 x weekly - 3 reps - 30 sec hold - Standing Gastroc Stretch at [employer]  - 2 x daily - 7 x weekly - 3 sets - 3 reps - 30 sec hold - Heel Toe Raises with Counter Support  - 1 x daily - 7 x weekly - 2 sets - 10 reps - Standing Hip Abduction with Counter Support  - 1 x daily - 7 x weekly - 2 sets - 10 reps - Seated Slump Nerve Glide  - 1 x daily - 7 x weekly - 2 sets - 10 reps - 3 sec hold - Supine Sciatic Nerve Glide (Mirrored)  - 1 x daily - 7 x weekly - 2 sets - 10 reps - 3 sec hold - Bridge with Resistance  - 1 x daily - 7 x weekly - 2 sets - 10 reps - 5 sec hold - Supine Hip Adduction Isometric with Ball  - 2 x daily - 7 x weekly - 2 sets - 10 reps - 5 sec hold - Hooklying Clamshell with Resistance  - 1 x daily - 7 x weekly - 2 sets - 10 reps - 3-5 sec hold - Standing Glute Med Mobilization with Small Ball on Wall  - 1 x daily - 7 x weekly - 1-2 reps - 1-2 min hold  Patient Education - TENS Therapy   ASSESSMENT:  CLINICAL IMPRESSION: Saleah reports her pain seems to be reduced during the day but remains very intense at night.  She remains very apprehensive about dry needling therefore deferred this today, however decreased muscle tension and TTP noted during palpation of R lateral glutes today.  Provided education in self-STM for glutes using tennis ball on wall with patient noting benefit from this.  Reviewed sciatic nerve glides providing cues for minor corrections - patient feels like these are helping.  She denies  need for review of the remainder of her initial HEP.  Progressed lumbopelvic stabilization and strengthening in supine with good tolerance, therefore updated HEP to reflect exercise progression.  During strengthening exercises patient noted to have apparent pelvic asymmetry which was addressed with MT including MET resulting in correction of SI joint alignment.  Achaia will benefit from skilled PT to address above deficits to improve mobility and activity tolerance with decreased pain interference.   OBJECTIVE IMPAIRMENTS: Abnormal gait, decreased activity tolerance, decreased endurance, decreased knowledge of condition, decreased mobility, difficulty walking, decreased ROM, decreased strength, increased fascial restrictions, impaired perceived functional ability, increased muscle spasms, impaired flexibility, impaired sensation, improper body mechanics, postural dysfunction, and pain.   ACTIVITY LIMITATIONS: carrying, lifting, sitting, standing, squatting, sleeping, stairs, transfers, bed mobility, locomotion level, and caring for others  PARTICIPATION LIMITATIONS: meal prep, cleaning, laundry, driving, community activity, and gym workouts  PERSONAL FACTORS: Past/current experiences, Time since onset of injury/illness/exacerbation, and 3+ comorbidities: Arthritis - B knees, osteopenia, bilateral SI joint dysfunction, DM, obesity  are also affecting patient's functional outcome.   REHAB POTENTIAL: Good  CLINICAL DECISION MAKING: Evolving/moderate complexity  EVALUATION COMPLEXITY: Moderate   GOALS: Goals reviewed with patient? Yes  SHORT TERM GOALS: Target date: 01/31/2023  Patient will be independent with initial HEP to improve outcomes and carryover.  Baseline: Initial HEP provided on eval Goal status: IN PROGRESS - 01/19/23  2.  Patient will report centralization of radicular symptoms.  Baseline: constant R radicular pain from buttock to R ankle, with intermittent numbness and tingling in  R foot/toes Goal status: IN PROGRESS - 01/19/23 - patient  reports pain much better during the day but still 8/10 at night  LONG TERM GOALS: Target date: 02/28/2023   Patient will be independent with ongoing/advanced HEP for self-management at home.  Baseline:  Goal status: IN PROGRESS  2.  Patient will report 75% improvement in R LE radicular pain to improve QOL.  Baseline: 10-12/10 Goal status: IN PROGRESS  3.  Patient to demonstrate ability to achieve and maintain good spinal alignment/posturing and body mechanics needed for daily activities. Baseline:  Goal status: IN PROGRESS  4.  Patient will demonstrate improved B proximal LE strength to >/= 4+/5 for improved stability and ease of mobility . Baseline: Refer to above LE MMT table Goal status: IN PROGRESS  5.  Patient will report >/= 54/80 on LEFS to demonstrate improved functional ability.  Baseline: 45 / 80 = 56.3 % Goal status: IN PROGRESS   6. Patient will report </= 52% on Modified Oswestry to demonstrate improved functional ability with decreased pain interference. Baseline: 32 / 50 = 64.0 % Goal status: IN PROGRESS  7.  Patient will report ability to sleep with <25% sleep disturbance due to R LE radicular pain. Baseline: Sleep completely disturbed due to pain Goal status: IN PROGRESS   PLAN:  PT FREQUENCY: 2x/week  PT DURATION: 6-8 weeks  PLANNED INTERVENTIONS: Therapeutic exercises, Therapeutic activity, Neuromuscular re-education, Balance training, Gait training, Patient/Family education, Self Care, Joint mobilization, Stair training, Aquatic Therapy, Dry Needling, Electrical stimulation, Spinal manipulation, Spinal mobilization, Cryotherapy, Moist heat, Taping, Traction, Ultrasound, Ionotophoresis 4mg /ml Dexamethasone, Manual therapy, and Re-evaluation  PLAN FOR NEXT SESSION: Reassess SI joint alignment; progress lumbopelvic flexibility and strengthening; MT +/- DN (patient afraid of needles) to glutes and  performance as indicated and benefit noted   Marry Guan, PT 01/19/2023, 1:15 PM   Date of referral: 12/26/22 Referring provider: Ralene Cork, DO Referring diagnosis? M54.10 (ICD-10-CM) - Radiculopathy of leg Treatment diagnosis? (if different than referring diagnosis)  Radiculopathy, lumbosacral region  Other muscle spasm  Muscle weakness (generalized)  Other abnormalities of gait and mobility  What was this (referring dx) caused by? Unspecified  Nature of Condition: Initial Onset (within last 3 months)   Laterality: Rt  Current Functional Measure Score: Back Index 32 / 50 = 64.0 % ; LEFS 45 / 80 = 56.3 %  Objective measurements identify impairments when they are compared to normal values, the uninvolved extremity, and prior level of function.  [x]  Yes  []  No  Objective assessment of functional ability: Severe functional limitations   Briefly describe symptoms:  Onset of R LE radiculopathy pain ~3 weeks ago without known MOI or triggering event.  Radicular pain is nearly constant and originates in R buttock and extends down posterior lateral leg to R ankle, with intermittent numbness and tingling in R foot/toes, however red flag signs negative.  Her lumbar ROM is essentially WFL, however some proximal LE tightness noted.  Only minor proximal LE weakness noted.  Slump and SLR test negative however she does note slight improvement in her pain with extension-based exercises.  Increased muscle tension with significant TTP noted over TPs and R glutes and piriformis.    How did symptoms start: Insidious onset  Average pain intensity:  Last 24 hours: 10-12/10  Past week: 10/10  How often does the pt experience symptoms? Constantly  How much have the symptoms interfered with usual daily activities? Extremely  How has condition changed since care began at this facility? NA - initial visit  In general, how  is the patients overall health? Very Good  Onset date: ~3 weeks  ago   Back screening: (When applicable)  Has your back pain spread down your leg(s) at sometime in the last 2 weeks? [x]  Yes   []  No Have you had pain in the shoulder or neck at sometime in the past 2 weeks? []  Yes   [x]  No Have you only walks short distances because of your back pain? [x]  Yes   []  No In the past 2 weeks, heavy dress more slowly than usual because of your back pain? [x]  Yes   []  No Do you think it is not really safe for person with a condition like yours to be physically active? []  Yes   [x]  No Have worrying thoughts been going through your mind a lot of the time? []  Yes   [x]  No Do you feel that your back pain is terrible and it is never going to get any better? [x]  Yes   []  No In general, have you stopped enjoying all the things you usually enjoy? [x]  Yes   []  No Overall, how bothersome has your back pain been in the last 2 weeks? []  Not at all   []  Slightly     []  Moderate   []  Very much     [x]  Extremely

## 2023-01-21 ENCOUNTER — Encounter: Payer: Self-pay | Admitting: Pharmacist

## 2023-01-21 NOTE — Progress Notes (Signed)
Pharmacy Quality Measure Review  This patient is appearing on a report for being at risk of failing the adherence measure for cholesterol (statin) medications this calendar year.   Medication: atorvastatin 10 mg Last fill date: 10/21 for 90 day supply  Insurance report was not up to date. No action needed at this time.   Jarrett Ables, PharmD PGY-1 Pharmacy Resident

## 2023-01-23 ENCOUNTER — Ambulatory Visit: Payer: Medicare Other | Admitting: Physical Therapy

## 2023-01-24 ENCOUNTER — Other Ambulatory Visit: Payer: Self-pay | Admitting: Medical

## 2023-01-24 ENCOUNTER — Ambulatory Visit (INDEPENDENT_AMBULATORY_CARE_PROVIDER_SITE_OTHER): Payer: Medicare Other | Admitting: Medical

## 2023-01-24 VITALS — BP 122/72 | HR 98 | Temp 98.0°F | Resp 18 | Ht 62.0 in | Wt 168.4 lb

## 2023-01-24 DIAGNOSIS — G8929 Other chronic pain: Secondary | ICD-10-CM

## 2023-01-24 DIAGNOSIS — M25561 Pain in right knee: Secondary | ICD-10-CM | POA: Diagnosis not present

## 2023-01-24 DIAGNOSIS — Z23 Encounter for immunization: Secondary | ICD-10-CM | POA: Diagnosis not present

## 2023-01-24 DIAGNOSIS — E7849 Other hyperlipidemia: Secondary | ICD-10-CM

## 2023-01-24 DIAGNOSIS — M5441 Lumbago with sciatica, right side: Secondary | ICD-10-CM

## 2023-01-24 MED ORDER — FLUTICASONE PROPIONATE 50 MCG/ACT NA SUSP: 2.0000 | Freq: Every day | NASAL | 1 refills | Status: DC

## 2023-01-24 MED ORDER — PREGABALIN 75 MG PO CAPS: 75.0000 mg | ORAL_CAPSULE | Freq: Two times a day (BID) | ORAL | 3 refills | Status: DC

## 2023-01-24 NOTE — Progress Notes (Signed)
Subjective:    Patient ID: Janet Peters, female    DOB: 1950-12-31, 72 y.o.   MRN: [id number]  HPI  Pt in for follow up for back pain. Last AVS below in "  "1. Chronic right-sided low back pain with right-sided sciatica. On review pain as far back as 2021. See xray done at that time. -failed various nsaids, narcotics and med for neuropathic pain. -will rx low dose taper medrol. Recent sugars controlled. Pt is diabetes will get A1c today. Benefit vs risk of medrol discussed.  -lyrica 75 mg bid for 2 weeks. -medrol 4 days taper dose. -advised on low sugar diet while on medrol -advised to stop all meds recently written for back pain. Pt expressed understanding     2. Acute pain of right knee - DG Knee Complete 4 Views Right; Future"  Now having pain level 5/10.Before was 8-10/10.  Pt does have appointment with spine specialist in 2 days.  Pt states willing to see sport med md but after sees back specialist   High cholesterol-on statin and fenofibrate.    Review of Systems  Constitutional:  Negative for chills, fatigue and fever.  Respiratory:  Negative for cough, chest tightness and wheezing.   Cardiovascular:  Negative for chest pain and palpitations.  Gastrointestinal:  Negative for abdominal pain and diarrhea.  Genitourinary:  Negative for dysuria.  Musculoskeletal:  Negative for back pain.  Neurological:  Negative for dizziness, seizures, weakness and headaches.  Hematological:  Negative for adenopathy. Does not bruise/bleed easily.  Psychiatric/Behavioral:  Negative for behavioral problems and dysphoric mood. The patient is not nervous/anxious.    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 04/27/2022   Chest discomfort 11/19/2019   Diabetes mellitus due to underlying condition with unspecified complications (HCC) 01/01/2018   Diabetes mellitus without complication (HCC)    on meds   Hyperlipidemia    on meds   Mixed dyslipidemia 01/01/2018   Obesity (BMI 30.0-34.9) 07/04/2021   Osteopenia 10/2016   T score -1.3 FRAX 3.5%/0.1%   Rash and other nonspecific skin eruption 06/28/2021   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     Social History   Socioeconomic History   Marital status: Married    Spouse name: Not on file   Number of children: Not on file   Years of education: Not on file   Highest education level: Bachelor's degree (e.g., BA, AB, BS)  Occupational History   Not on file  Tobacco Use   Smoking status: Never   Smokeless tobacco: Never  Vaping Use   Vaping status: Never Used  Substance and Sexual Activity   Alcohol use: No   Drug use: No   Sexual activity: Yes    Partners: Male    Birth control/protection: Surgical    Comment: 1st intercourse- 23, partners - 2, married- 20 yrs, hysterectomy  Other Topics Concern   Not on file  Social History Narrative   Not on file   Social Determinants of Health   Financial Resource Strain: Medium Risk (09/05/2022)   Overall Financial Resource Strain (CARDIA)    Difficulty of  Paying Living Expenses: Somewhat hard  Food Insecurity: Patient Declined (01/09/2023)   Hunger Vital Sign    Worried About Running Out of Food in the Last Year: Patient declined    Ran Out of Food in the Last Year: Patient declined  Transportation Needs: No Transportation Needs (01/09/2023)   PRAPARE - Administrator, Civil Service (Medical): No    Lack of Transportation (Non-Medical): No  Physical Activity: Sufficiently Active (01/09/2023)   Exercise Vital Sign    Days of Exercise per Week: 4 days    Minutes of Exercise per Session: 70 min   Stress: Patient Declined (01/09/2023)   [employer] of Occupational Health - Occupational Stress Questionnaire    Feeling of Stress : Patient declined  Social Connections: Moderately Integrated (01/09/2023)   Social Connection and Isolation Panel [NHANES]    Frequency of Communication with Friends and Family: More than three times a week    Frequency of Social Gatherings with Friends and Family: More than three times a week    Attends Religious Services: 1 to 4 times per year    Active Member of [employer] or Organizations: No    Attends Engineer, structural: Not on file    Marital Status: Married  Catering manager Violence: Not on file    Past Surgical History:  Procedure Laterality Date   CATARACT EXTRACTION Left    CESAREAN SECTION     x2   CHOLECYSTECTOMY, LAPAROSCOPIC  2010   KNEE ARTHROSCOPY W/ MENISCAL REPAIR Left 2011   PARTIAL HYSTERECTOMY  1988    Family History  Problem Relation Age of Onset   Heart attack Maternal Grandmother    Colon polyps Neg Hx    Colon cancer Neg Hx    Esophageal cancer Neg Hx    Stomach cancer Neg Hx    Rectal cancer Neg Hx     No Known Allergies  Current Outpatient Medications on File Prior to Visit  Medication Sig Dispense Refill   ACCU-CHEK GUIDE test strip USE TO TEST BLOOD GLUCOSE FOUR TIMES DAILY AS DIRECTED 200 strip 12   atorvastatin (LIPITOR) 10 MG tablet TAKE 1 TABLET(10 MG) BY MOUTH DAILY 90 tablet 2   Cholecalciferol (VITAMIN D3) 125 MCG (5000 UT) CAPS Take 5,000 Units by mouth daily.     clobetasol cream (TEMOVATE) 0.05 % Apply 1 Application topically 2 (two) times daily. Apply twice daily as needed. 60 g 0   diclofenac (VOLTAREN) 75 MG EC tablet Take 1 tablet twice daily with food as needed for pain. (Patient not taking: Reported on 01/10/2023) 60 tablet 0   fenofibrate (TRICOR) 48 MG tablet Take 1 tablet (48 mg total) by mouth daily. 90 tablet 3   fluticasone (FLONASE) 50 MCG/ACT nasal spray SHAKE LIQUID AND USE  2 SPRAYS IN EACH NOSTRIL DAILY (Patient taking differently: Place 2 sprays into both nostrils daily.) 48 g 0   gabapentin (NEURONTIN) 100 MG capsule Tome 1 o 2 capsulas por la noche. Si funciona bien, puede tomar 1 o 2 capsulas tres veces al dia. No lo tome con oxicodona. 30 capsule 0   JANUVIA 25 MG tablet TAKE 1 TABLET(25 MG) BY MOUTH DAILY 30 tablet 3   levocetirizine (XYZAL) 5 MG tablet Take 1 tablet (5 mg total) by mouth every evening. (Patient not taking: Reported on 01/10/2023) 30 tablet 3   losartan (COZAAR) 50 MG tablet Take 1 tablet (50 mg total) by mouth daily. TAKE 1 TABLET(50 MG) BY MOUTH DAILY 90 tablet  3   MAGNESIUM PO Take 1 tablet by mouth daily.     meloxicam (MOBIC) 15 MG tablet Take 1 by mouth daily with food for 7 days then as needed. (Patient not taking: Reported on 01/10/2023) 40 tablet 0   metFORMIN (GLUCOPHAGE) 500 MG tablet 2 tab in am and 1 tab pm (Patient taking differently: Take 500 mg by mouth See admin instructions. 2 tab in am and 1 tab pm) 270 tablet 3   methylPREDNISolone (MEDROL) 4 MG tablet 4 tab po day 1, 3 tab po day 2, 2 tab po day 3 and 1 tab po day 4 10 tablet 0   NON FORMULARY Take 1 tablet by mouth See admin instructions. Pt taking organic resveration     pregabalin (LYRICA) 75 MG capsule Take 1 capsule (75 mg total) by mouth 2 (two) times daily. 30 capsule 0   traMADol (ULTRAM) 50 MG tablet Take 1 tablet (50 mg total) by mouth every 8 (eight) hours as needed. (Patient not taking: Reported on 01/10/2023) 15 tablet 0   No current facility-administered medications on file prior to visit.    BP 122/72   Pulse 98   Temp 98 F (36.7 C)   Resp 18   Ht 5\' 2"  (1.575 m)   Wt 168 lb 6.4 oz (76.4 kg)   SpO2 100%   BMI 30.80 kg/m        Objective:   Physical Exam   General Appearance- Not in acute distress.       Chest and Lung Exam Auscultation: Breath sounds:-Normal. Clear even and unlabored. Adventitious sounds:- No Adventitious sounds.    Cardiovascular Auscultation:Rythm - Regular, rate and rythm. Heart Sounds -Normal heart sounds.   Abdomen Inspection:-Inspection Normal.  Palpation/Perucssion: Palpation and Percussion of the abdomen reveal- Non Tender, No Rebound tenderness, No rigidity(Guarding) and No Palpable abdominal masses.  Liver:-Normal.  Spleen:- Normal.    Back  Rt side para lumbar spine tenderness to palpation and si tender Pain on straight leg lift. Pain on lateral movements and flexion/extension of the spine.   Lower ext neurologic   L5-S1 sensation intact bilaterally. Normal patellar reflexes bilaterally. No foot drop bilaterally.    Rt knee- pain on range of motion. Moderate crepitus.     Assessment & Plan:   Patient Instructions  1. Chronic right-sided low back pain with right-sided sciatica -pain level decreased with lyrica. Will refill today. Uds and contract start. -back specialist  appointment in 2 days  2. Acute pain of right knee -placed referral to sport med. When they call clarify you want to see 3-4 weeks. Currenlty  - Ambulatory referral to Sports Medicine  3. hyperlipidemia Continue fenofbirate and atorvastatin  4.  Discussed your wt loss concerns. Recent stressed and decreased appetite with back pain. Weight loss not severe. Normal lipase. Up to date on mammogram and colonoscopy. On review prior hysterectomy. Make effort to increase calories and gain wt. If continued wt loss despite increase calories then consider work up.  Follow up 2 months or sooner if needed   [employer], PA-C

## 2023-01-24 NOTE — Addendum Note (Signed)
Addended by: Maximino Sarin on: 01/24/2023 08:55 AM   Modules accepted: Orders

## 2023-01-24 NOTE — Patient Instructions (Addendum)
1. Chronic right-sided low back pain with right-sided sciatica -pain level decreased with lyrica. Will refill today. Uds and contract start. -back specialist  appointment in 2 days  2. Acute pain of right knee -placed referral to sport med. When they call clarify you want to see 3-4 weeks. Currenlty  - Ambulatory referral to Sports Medicine  3. hyperlipidemia Continue fenofibrate  and atorvastatin  4.  Discussed your wt loss concerns. Recent stressed and decreased appetite with back pain. Weight loss not severe. Normal lipase. Up to date on mammogram and colonoscopy. On review prior hysterectomy. Make effort to increase calories and gain wt. If continued wt loss despite increase calories then consider work up.  Follow up 2 months or sooner if needed

## 2023-01-25 ENCOUNTER — Ambulatory Visit: Payer: Medicare Other

## 2023-01-25 DIAGNOSIS — R2689 Other abnormalities of gait and mobility: Secondary | ICD-10-CM | POA: Diagnosis not present

## 2023-01-25 DIAGNOSIS — M6281 Muscle weakness (generalized): Secondary | ICD-10-CM | POA: Diagnosis not present

## 2023-01-25 DIAGNOSIS — M62838 Other muscle spasm: Secondary | ICD-10-CM

## 2023-01-25 DIAGNOSIS — M5410 Radiculopathy, site unspecified: Secondary | ICD-10-CM | POA: Diagnosis not present

## 2023-01-25 DIAGNOSIS — M5417 Radiculopathy, lumbosacral region: Secondary | ICD-10-CM | POA: Diagnosis not present

## 2023-01-25 NOTE — Therapy (Signed)
OUTPATIENT PHYSICAL THERAPY TREATMENT   Patient Name: Janet Peters MRN: [id number] DOB:February 27, 1951, 72 y.o., female Today's Date: 01/25/2023  END OF SESSION:  PT End of Session - 01/25/23 1129     Visit Number 5    Date for PT Re-Evaluation 02/28/23    Authorization Type [employer]    Authorization Time Period 01/03/23 - 02/28/23    Authorization - Visit Number 5    Authorization - Number of Visits 16    PT Start Time 1104    PT Stop Time 1146    PT Time Calculation (min) 42 min    Activity Tolerance Patient tolerated treatment well    Behavior During Therapy [facility] for tasks assessed/performed                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   Bradycardia    per pt report   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 04/27/2022   Chest discomfort 11/19/2019   Diabetes mellitus due to underlying condition with unspecified complications (HCC) 01/01/2018   Diabetes mellitus without complication (HCC)    on meds   Hyperlipidemia    on meds   Mixed dyslipidemia 01/01/2018   Obesity (BMI 30.0-34.9) 07/04/2021   Osteopenia 10/2016   T score -1.3 FRAX 3.5%/0.1%   Rash and other nonspecific skin eruption 06/28/2021   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   Past Surgical History:  Procedure Laterality Date   CATARACT EXTRACTION Left    CESAREAN SECTION     x2   CHOLECYSTECTOMY, LAPAROSCOPIC  2010   KNEE ARTHROSCOPY W/ MENISCAL REPAIR Left 2011   PARTIAL HYSTERECTOMY  1988   Patient Active Problem List   Diagnosis Date Noted   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 04/27/2022   Aortic calcification (HCC) 07/04/2021   Obesity (BMI 30.0-34.9) 07/04/2021   Rash  and other nonspecific skin eruption 06/28/2021   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   Allergic rhinitis due to pollen 02/23/2021   Allergy    Arthritis    Bradycardia    Diabetes mellitus without complication (HCC)    Hyperlipidemia    Chest discomfort 11/19/2019   Mixed dyslipidemia 01/01/2018   Diabetes mellitus due to underlying condition with unspecified complications (HCC) 01/01/2018   Age-related nuclear cataract of left eye 04/24/2017   Osteopenia 10/2016    PCP: Esperanza Richters, PA-C   REFERRING PROVIDER: Ralene Cork, DO   REFERRING DIAG: M54.10 (ICD-10-CM) - Radiculopathy of leg  THERAPY DIAG:  Radiculopathy, lumbosacral region  Other muscle spasm  Muscle weakness (generalized)  Other abnormalities of gait and mobility  RATIONALE FOR EVALUATION AND TREATMENT: Rehabilitation  ONSET DATE:  Mid Sept 2024  NEXT MD VISIT: 01/26/23 with Dr. Willia Craze (orthopedics)   SUBJECTIVE:  SUBJECTIVE STATEMENT: Via interpreter, feels good today very little pain also doesn't have much pain at night anymore.  PAIN: Are you having pain? Yes: NPRS scale: 1 currently/10 Pain location: R lower leg Pain description: "very strong" burning esp at knee and ankle,  numbness and tingling in lower leg and toes Aggravating factors: walking, prolonged standing  Relieving factors: nothing - no benefit from meds, topical analgesics, ice/heat or TENS  PERTINENT HISTORY:  Arthritis - B knees, osteopenia, bilateral SI joint dysfunction, DM, obesity  PRECAUTIONS: None  RED FLAGS: None  WEIGHT BEARING RESTRICTIONS: No  FALLS:  Has patient fallen in last 6 months? Yes. Number of falls 1  LIVING ENVIRONMENT: Lives  with: lives with their spouse and lives with their daughter Lives in: House/apartment Stairs: Yes: External: 15 steps; bilateral but cannot reach both Has following equipment at home: None  OCCUPATION: Retired  PLOF: Independent and Leisure: gym 4x/wk - walking, bike, [employer] machines   PATIENT GOALS: "To take this pain off so I can sleep."   OBJECTIVE: (objective measures completed at initial evaluation unless otherwise dated)  DIAGNOSTIC FINDINGS:  No recent imaging available for lumbar spine.  03/12/21 - MR thoracic spine: IMPRESSION: Mild multilevel foraminal stenosis in the lower thoracic spine without significant canal stenosis.  02/23/21 - XR thoracic spine: IMPRESSION:  1.  No acute displaced fracture.  2.  Mild multilevel degenerative disc disease.  3.  Mild degenerative changes of the acromioclavicular joints.  4.  Cholecystectomy clips project over the right upper quadrant.   PATIENT SURVEYS:  Modified Oswestry 32 / 50 = 64.0 %  LEFS 45 / 80 = 56.3 %  SCREENING FOR RED FLAGS: Bowel or bladder incontinence: No Spinal tumors: No Cauda equina syndrome: No Compression fracture: No Abdominal aneurysm: No  COGNITION:  Overall cognitive status: Within functional limits for tasks assessed    SENSATION: Intermittent numbness in R toes  MUSCLE LENGTH: Hamstrings: Mild tight B ITB: Mild tight B Piriformis: Mod/severe tight B Hip flexors: Mild tight B Quads: Mild tight B Heelcord: NT  POSTURE:  rounded shoulders, forward head, and weight shift left  PALPATION: Increased muscle tension and TTP in R glutes and piriformis  LUMBAR ROM:   Active  Eval  Flexion WFL  Extension WFL  Right lateral flexion WFL  Left lateral flexion WFL  Right rotation WFL - decreased pain  Left rotation WFL  (Blank rows = not tested)  LOWER EXTREMITY ROM:    B LE grossly WFL other than limited hip ER/IR  LOWER EXTREMITY MMT:    MMT Right eval Left eval  Hip flexion 5 5   Hip extension 4+ 4+  Hip abduction 4 4  Hip adduction 4 4  Hip internal rotation 5 5  Hip external rotation 4 4+  Knee flexion 5 5  Knee extension 5 5  Ankle dorsiflexion 5 5  Ankle plantarflexion    Ankle inversion    Ankle eversion     (Blank rows = not tested)  LUMBAR SPECIAL TESTS:  Straight leg raise test: Negative and Slump test: Negative  GAIT: Distance walked: Clinic distances Assistive device utilized: None Level of assistance: Complete Independence Gait pattern: decreased stance time- Right, antalgic, and lateral lean- Left   TODAY'S TREATMENT:  01/25/23 THERAPEUTIC EXERCISE: to improve flexibility, strength and mobility.  Demonstration, verbal and tactile cues throughout for technique.  NuStep - L5 x 6 min Seated R sciatic nerve glides 2 x 10 Supine TrA and PPT 10x5"; added hip  ADD ball squeeze 10x5" Hooklying figure 4 stretch 2x30" bil Bridge with GTB 2x10- 5 second hold Hooklying TrA/PPT + B GTB hip ABD/ER  2x10- 5 second hold Hooklying march with PPT GTB 10x5"  MANUAL THERAPY: To promote normalized muscle tension, improved flexibility, reduced pain, and normalize SIJ alignnment. STM/DTM & manual TPR to R lateral glutes  01/19/23 THERAPEUTIC EXERCISE: to improve flexibility, strength and mobility.  Demonstration, verbal and tactile cues throughout for technique.  NuStep - L4 x 6 min Seated R sciatic nerve glide x 10 Hooklying R KTOS piriformis stretch with light overpressure 2 x 30"  Seated R sciatic nerve glide 3 x 10 Hooklying TrA/PPT 10 x 5" Hooklying TrA/PPT + hip ADD ball squeeze isometric 10 x 5" Bridge + GTB hip ABD isometric 10 x 5" Hooklying TrA/PPT + B GTB hip ABD/ER  10 x 5"   MANUAL THERAPY: To promote normalized muscle tension, improved flexibility, reduced pain, and normalize SIJ alignnment . MET to correct apparent R anterior innominate rotation of pelvis on sacrum followed by pelvic shotgun - 2 cycles - alignment normalized after 2nd  set R LE longitudinal distraction between MET sets STM/DTM & manual TPR to R lateral glutes Provided instruction in self-STM techniques to R glutes using tennis ball on wall.    01/16/23 THERAPEUTIC EXERCISE: to improve flexibility, strength and mobility.  Demonstration, verbal and tactile cues throughout for technique.  NuStep - L4 x 6 min Hooklying R figure-4 piriformis stretch with light overpressure x 30" Hooklying R KTOS piriformis stretch with light overpressure 2 x 30" - pt reports better stretch Seated R KTOS piriformis stretch 2 x 30" Seated R figure-4 hip hinge piriformis stretch x 30" Standing R runner's gastroc stretch x 30" Seated R sciatic nerve glide x 10 Hooklying R sciatic nerve glide x 10  MANUAL THERAPY: To promote normalized muscle tension, improved flexibility, and reduced pain. Skilled palpation and monitoring of soft tissue during DN Trigger Point Dry-Needling  Treatment instructions: Expect mild to moderate muscle soreness. S/S of pneumothorax if dry needled over a lung field, and to seek immediate medical attention should they occur. Patient verbalized understanding of these instructions and education. Patient Consent Given: Yes Education handout provided: Previously provided Muscles treated: R medial pirifomis Electrical stimulation performed: No Parameters: N/A Treatment response/outcome: Twitch Response Elicited and Palpable Increase in Muscle Length STM/DTM, manual TPR and pin & stretch to muscles addressed with DN Foam roller IASTM to R glutes/piriformis, posterior lateral thigh and lower leg  MODALITIES:  Pre-mod to R glutes/piriformis and R lower leg - intensity to pt tolerance x 15" Moist heat to R buttock x 15"   01/09/23 Nustep L4x22min Supine SKTC 3x10" bil Seated piriformis stretch 3x10" bil KTOS Seated LAQ with TrA x 10 bil Runner stretch 2x30 sec RLE Standing heel raise and toe raise x 10 bil Standing hip abduction x 10 bil   PATIENT  EDUCATION:  Education details: HEP update - lumbopelvic strengthening and self-STM techniques to glutes using ball on wall Person educated: Patient Education method: Explanation, Demonstration, Tactile cues, Verbal cues, and Handouts Education comprehension: verbalized understanding, returned demonstration, verbal cues required, tactile cues required, and needs further education  HOME EXERCISE PROGRAM: Access Code: [email or web address] URL: [email or web address] Date: 01/19/2023 Prepared by: Glenetta Hew  Exercises - Static Prone on Elbows  - 2-3 x daily - 7 x weekly - 2 sets - 3 reps - 30 sec hold - Prone Press Up  - 2-3 x daily - 7 x weekly -  2 sets - 10 reps - 2 sec hold - Standing Lumbar Extension at Wall - Forearms  - 2-3 x daily - 7 x weekly - 2 sets - 10 reps - 3-5 sec hold - Standing Lumbar Extension with Counter  - 2-3 x daily - 7 x weekly - 2 sets - 10 reps - 3-5 sec hold - Supine Figure 4 Piriformis Stretch  - 2-3 x daily - 7 x weekly - 3 reps - 30 sec hold - Supine Piriformis Stretch with Foot on Ground  - 2-3 x daily - 7 x weekly - 3 reps - 30 sec hold - Standing Gastroc Stretch at [employer]  - 2 x daily - 7 x weekly - 3 sets - 3 reps - 30 sec hold - Heel Toe Raises with Counter Support  - 1 x daily - 7 x weekly - 2 sets - 10 reps - Standing Hip Abduction with Counter Support  - 1 x daily - 7 x weekly - 2 sets - 10 reps - Seated Slump Nerve Glide  - 1 x daily - 7 x weekly - 2 sets - 10 reps - 3 sec hold - Supine Sciatic Nerve Glide (Mirrored)  - 1 x daily - 7 x weekly - 2 sets - 10 reps - 3 sec hold - Bridge with Resistance  - 1 x daily - 7 x weekly - 2 sets - 10 reps - 5 sec hold - Supine Hip Adduction Isometric with Ball  - 2 x daily - 7 x weekly - 2 sets - 10 reps - 5 sec hold - Hooklying Clamshell with Resistance  - 1 x daily - 7 x weekly - 2 sets - 10 reps - 3-5 sec hold - Standing Glute Med Mobilization with Small Ball on Wall  - 1 x daily - 7 x weekly - 1-2 reps - 1-2  min hold  Patient Education - TENS Therapy   ASSESSMENT:  CLINICAL IMPRESSION:  Pt able to complete all interventions. She is sleeping way better now noting 80% improvement meeting LTG #7. She continues to show decreased core activation and strength with exercises. She continues to benefit from skilled therapy. Janet Peters will benefit from skilled PT to address above deficits to improve mobility and activity tolerance with decreased pain interference.   OBJECTIVE IMPAIRMENTS: Abnormal gait, decreased activity tolerance, decreased endurance, decreased knowledge of condition, decreased mobility, difficulty walking, decreased ROM, decreased strength, increased fascial restrictions, impaired perceived functional ability, increased muscle spasms, impaired flexibility, impaired sensation, improper body mechanics, postural dysfunction, and pain.   ACTIVITY LIMITATIONS: carrying, lifting, sitting, standing, squatting, sleeping, stairs, transfers, bed mobility, locomotion level, and caring for others  PARTICIPATION LIMITATIONS: meal prep, cleaning, laundry, driving, community activity, and gym workouts  PERSONAL FACTORS: Past/current experiences, Time since onset of injury/illness/exacerbation, and 3+ comorbidities: Arthritis - B knees, osteopenia, bilateral SI joint dysfunction, DM, obesity  are also affecting patient's functional outcome.   REHAB POTENTIAL: Good  CLINICAL DECISION MAKING: Evolving/moderate complexity  EVALUATION COMPLEXITY: Moderate   GOALS: Goals reviewed with patient? Yes  SHORT TERM GOALS: Target date: 01/31/2023  Patient will be independent with initial HEP to improve outcomes and carryover.  Baseline: Initial HEP provided on eval Goal status: IN PROGRESS - 01/19/23  2.  Patient will report centralization of radicular symptoms.  Baseline: constant R radicular pain from buttock to R ankle, with intermittent numbness and tingling in R foot/toes Goal status: IN PROGRESS -  01/19/23 - patient reports pain much better  during the day but still 8/10 at night  LONG TERM GOALS: Target date: 02/28/2023   Patient will be independent with ongoing/advanced HEP for self-management at home.  Baseline:  Goal status: IN PROGRESS  2.  Patient will report 75% improvement in R LE radicular pain to improve QOL.  Baseline: 10-12/10 Goal status: IN PROGRESS  3.  Patient to demonstrate ability to achieve and maintain good spinal alignment/posturing and body mechanics needed for daily activities. Baseline:  Goal status: IN PROGRESS  4.  Patient will demonstrate improved B proximal LE strength to >/= 4+/5 for improved stability and ease of mobility . Baseline: Refer to above LE MMT table Goal status: IN PROGRESS  5.  Patient will report >/= 54/80 on LEFS to demonstrate improved functional ability.  Baseline: 45 / 80 = 56.3 % Goal status: IN PROGRESS   6. Patient will report </= 52% on Modified Oswestry to demonstrate improved functional ability with decreased pain interference. Baseline: 32 / 50 = 64.0 % Goal status: IN PROGRESS  7.  Patient will report ability to sleep with <25% sleep disturbance due to R LE radicular pain. Baseline: Sleep completely disturbed due to pain Goal status: MET- 80% improvement    PLAN:  PT FREQUENCY: 2x/week  PT DURATION: 6-8 weeks  PLANNED INTERVENTIONS: Therapeutic exercises, Therapeutic activity, Neuromuscular re-education, Balance training, Gait training, Patient/Family education, Self Care, Joint mobilization, Stair training, Aquatic Therapy, Dry Needling, Electrical stimulation, Spinal manipulation, Spinal mobilization, Cryotherapy, Moist heat, Taping, Traction, Ultrasound, Ionotophoresis 4mg /ml Dexamethasone, Manual therapy, and Re-evaluation  PLAN FOR NEXT SESSION: Reassess SI joint alignment; progress lumbopelvic flexibility and strengthening; MT +/- DN (patient afraid of needles) to glutes and performance as indicated and  benefit noted   Darleene Cleaver, PTA 01/25/2023, 11:49 AM   Date of referral: 12/26/22 Referring provider: Ralene Cork, DO Referring diagnosis? M54.10 (ICD-10-CM) - Radiculopathy of leg Treatment diagnosis? (if different than referring diagnosis)  Radiculopathy, lumbosacral region  Other muscle spasm  Muscle weakness (generalized)  Other abnormalities of gait and mobility  What was this (referring dx) caused by? Unspecified  Nature of Condition: Initial Onset (within last 3 months)   Laterality: Rt  Current Functional Measure Score: Back Index 32 / 50 = 64.0 % ; LEFS 45 / 80 = 56.3 %  Objective measurements identify impairments when they are compared to normal values, the uninvolved extremity, and prior level of function.  [x]  Yes  []  No  Objective assessment of functional ability: Severe functional limitations   Briefly describe symptoms:  Onset of R LE radiculopathy pain ~3 weeks ago without known MOI or triggering event.  Radicular pain is nearly constant and originates in R buttock and extends down posterior lateral leg to R ankle, with intermittent numbness and tingling in R foot/toes, however red flag signs negative.  Her lumbar ROM is essentially WFL, however some proximal LE tightness noted.  Only minor proximal LE weakness noted.  Slump and SLR test negative however she does note slight improvement in her pain with extension-based exercises.  Increased muscle tension with significant TTP noted over TPs and R glutes and piriformis.    How did symptoms start: Insidious onset  Average pain intensity:  Last 24 hours: 10-12/10  Past week: 10/10  How often does the pt experience symptoms? Constantly  How much have the symptoms interfered with usual daily activities? Extremely  How has condition changed since care began at this facility? NA - initial visit  In general, how is the  patients overall health? Very Good  Onset date: ~3 weeks ago   Back screening:  (When applicable)  Has your back pain spread down your leg(s) at sometime in the last 2 weeks? [x]  Yes   []  No Have you had pain in the shoulder or neck at sometime in the past 2 weeks? []  Yes   [x]  No Have you only walks short distances because of your back pain? [x]  Yes   []  No In the past 2 weeks, heavy dress more slowly than usual because of your back pain? [x]  Yes   []  No Do you think it is not really safe for person with a condition like yours to be physically active? []  Yes   [x]  No Have worrying thoughts been going through your mind a lot of the time? []  Yes   [x]  No Do you feel that your back pain is terrible and it is never going to get any better? [x]  Yes   []  No In general, have you stopped enjoying all the things you usually enjoy? [x]  Yes   []  No Overall, how bothersome has your back pain been in the last 2 weeks? []  Not at all   []  Slightly     []  Moderate   []  Very much     [x]  Extremely

## 2023-01-26 ENCOUNTER — Ambulatory Visit: Payer: Medicare Other | Admitting: Orthopedic Surgery

## 2023-01-26 ENCOUNTER — Other Ambulatory Visit (INDEPENDENT_AMBULATORY_CARE_PROVIDER_SITE_OTHER): Payer: Medicare Other

## 2023-01-26 ENCOUNTER — Encounter: Payer: Self-pay | Admitting: Orthopedic Surgery

## 2023-01-26 VITALS — BP 116/76 | HR 97 | Ht 62.0 in | Wt 168.5 lb

## 2023-01-26 DIAGNOSIS — M545 Low back pain, unspecified: Secondary | ICD-10-CM

## 2023-01-26 LAB — DRUG MONITORING PANEL 376104, URINE
Amphetamines: NEGATIVE ng/mL (ref <500)
Barbiturates: NEGATIVE ng/mL (ref <300)
Benzodiazepines: NEGATIVE ng/mL (ref <100)
Cocaine Metabolite: NEGATIVE ng/mL (ref <150)
Desmethyltramadol: NEGATIVE ng/mL (ref <100)
Opiates: NEGATIVE ng/mL (ref <100)
Oxycodone: NEGATIVE ng/mL (ref <100)
Tramadol: NEGATIVE ng/mL (ref <100)

## 2023-01-26 LAB — DM TEMPLATE

## 2023-01-26 NOTE — Progress Notes (Signed)
Orthopedic Spine Surgery Office Note  Assessment: Patient is a 72 y.o. female with low back pain that rates into the right lower extremity along the lateral aspect of thigh and leg, suspect radiculopathy   Plan: -Explained that initially conservative treatment is tried as a significant number of patients may experience relief with these treatment modalities. Discussed that the conservative treatments include:  -activity modification  -physical therapy  -over the counter pain medications  -medrol dosepak  -lumbar steroid injections -Patient has tried toradol, mobic, PT, gabapentin, pregabalin, hydrocodone -Recommended continuing with the pregabalin.  She has had some relief with 2 weeks of therapy, so told her to continue with that -If she is not doing any better at her next visit, we will order an MRI to evaluate for radiculopathy -Patient should return to office in 4 weeks, x-rays at next visit: None   Patient expressed understanding of the plan and all questions were answered to the patient's satisfaction.   ___________________________________________________________________________   History:  Patient is a 72 y.o. female who presents today for lumbar spine.  Patient has had about 6 weeks of low back pain that radiates into the right lower extremity.  She feels it along the lateral aspect of the thigh and leg.  She also feels it in the right buttock.  She has no symptoms in the left lower extremity.  There is no trauma or injury that preceded the onset of pain.  She has tried multiple conservative treatments but has not found relief with any of the medications.  She has started physical therapy and finds that that has been somewhat helpful especially since this past Thursday.   Weakness: Denies Symptoms of imbalance: Denies Paresthesias and numbness: Yes, gets paresthesias down the lateral aspect of her thigh and leg on the right.  No other numbness or paresthesias Bowel or bladder  incontinence: Denies Saddle anesthesia: Denies  Treatments tried: toradol, mobic, PT, gabapentin, hydrocodone  Review of systems: Denies fevers and chills, night sweats, unexplained weight loss, history of cancer.  Has had pain that wakes her at night  Past medical history: Allergic rhinitis HLD DM (last A1c was 6.7 on 01/10/2023)  Allergies: NKDA  Past surgical history:  Cataract surgery C section Cholecystectomy Hysterectomy Left knee meniscus repair  Social history: Denies use of nicotine product (smoking, vaping, patches, smokeless) Alcohol use: Denies Denies recreational drug use   Physical Exam:  BMI of 30.8  General: no acute distress, appears stated age Neurologic: alert, answering questions appropriately, following commands Respiratory: unlabored breathing on room air, symmetric chest rise Psychiatric: appropriate affect, normal cadence to speech   MSK (spine):  -Strength exam      Left  Right EHL    5/5  5/5 TA    5/5  5/5 GSC    5/5  5/5 Knee extension  5/5  5/5 Hip flexion   5/5  5/5  -Sensory exam    Sensation intact to light touch in L3-S1 nerve distributions of bilateral lower extremities  -Achilles DTR: 2/4 on the left, 2/4 on the right -Patellar tendon DTR: 2/4 on the left, 2/4 on the right  -Straight leg raise: negative bilaterally -Femoral nerve stretch test: negative bilaterally -Clonus: no beats bilaterally  -Left hip exam: No pain through  range of motion, negative Stinchfield, negative FABER -Right hip exam: No pain through  range of motion, negative Stinchfield, negative FABER  Imaging: XRs of the lumbar spine from 01/03/2023 and 01/26/2023 was independently reviewed and interpreted, showing disc height  loss at L5/S1. No fracture or dislocation seen. No evidence of instability on flexion/extension views.    Patient name: Janet Peters Patient MRN: [id number] Date of visit: 01/26/23

## 2023-01-31 ENCOUNTER — Encounter: Payer: Self-pay | Admitting: Physical Therapy

## 2023-01-31 ENCOUNTER — Ambulatory Visit: Payer: Medicare Other | Admitting: Sports Medicine

## 2023-01-31 ENCOUNTER — Ambulatory Visit: Payer: Medicare Other | Attending: Sports Medicine | Admitting: Physical Therapy

## 2023-01-31 DIAGNOSIS — M6281 Muscle weakness (generalized): Secondary | ICD-10-CM | POA: Insufficient documentation

## 2023-01-31 DIAGNOSIS — R2689 Other abnormalities of gait and mobility: Secondary | ICD-10-CM | POA: Diagnosis not present

## 2023-01-31 DIAGNOSIS — M5417 Radiculopathy, lumbosacral region: Secondary | ICD-10-CM | POA: Insufficient documentation

## 2023-01-31 DIAGNOSIS — M62838 Other muscle spasm: Secondary | ICD-10-CM | POA: Insufficient documentation

## 2023-01-31 NOTE — Therapy (Addendum)
OUTPATIENT PHYSICAL THERAPY TREATMENT   Patient Name: Janet Peters MRN: [id number] DOB:1951-03-10, 72 y.o., female Today's Date: 01/31/2023  END OF SESSION:  PT End of Session - 01/31/23 0848     Visit Number 6    Date for PT Re-Evaluation 02/28/23    Authorization Type [employer] Medicare    Authorization Time Period 01/03/23 - 02/28/23    Authorization - Visit Number  6   Authorization - Number of Visits 16    PT Start Time 0848    PT Stop Time 0933    PT Time Calculation (min) 45 min    Activity Tolerance Patient tolerated treatment well    Behavior During Therapy [facility] for tasks assessed/performed                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   Bradycardia    per pt report   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 04/27/2022   Chest discomfort 11/19/2019   Diabetes mellitus due to underlying condition with unspecified complications (HCC) 01/01/2018   Diabetes mellitus without complication (HCC)    on meds   Hyperlipidemia    on meds   Mixed dyslipidemia 01/01/2018   Obesity (BMI 30.0-34.9) 07/04/2021   Osteopenia 10/2016   T score -1.3 FRAX 3.5%/0.1%   Rash and other nonspecific skin eruption 06/28/2021   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   Past Surgical History:  Procedure Laterality Date   CATARACT EXTRACTION Left    CESAREAN SECTION     x2   CHOLECYSTECTOMY, LAPAROSCOPIC  2010   KNEE ARTHROSCOPY W/ MENISCAL REPAIR Left 2011   PARTIAL HYSTERECTOMY  1988   Patient Active Problem List   Diagnosis Date Noted   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 04/27/2022   Aortic calcification (HCC) 07/04/2021   Obesity (BMI 30.0-34.9) 07/04/2021   Rash  and other nonspecific skin eruption 06/28/2021   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   Allergic rhinitis due to pollen 02/23/2021   Allergy    Arthritis    Bradycardia    Diabetes mellitus without complication (HCC)    Hyperlipidemia    Chest discomfort 11/19/2019   Mixed dyslipidemia 01/01/2018   Diabetes mellitus due to underlying condition with unspecified complications (HCC) 01/01/2018   Age-related nuclear cataract of left eye 04/24/2017   Osteopenia 10/2016    PCP: Esperanza Richters, PA-C   REFERRING PROVIDER: Ralene Cork, DO   REFERRING DIAG: M54.10 (ICD-10-CM) - Radiculopathy of leg  THERAPY DIAG:  Radiculopathy, lumbosacral region  Other muscle spasm  Muscle weakness (generalized)  Other abnormalities of gait and mobility  RATIONALE FOR EVALUATION AND TREATMENT: Rehabilitation  ONSET DATE:  Mid Sept 2024  NEXT MD VISIT: 01/26/23 with Dr. Willia Craze (orthopedics)   SUBJECTIVE:  SUBJECTIVE STATEMENT: Via interpreter, pt reports she went the the gym on Monday and Tuesday and walked a lot. Last night had pain in her R lower leg at 5/10 and today just in her R foot.  She reports she saw the specialist on Friday - he wants her to try 2 more weeks of PT and then may consider a MRI to determine if she need an injection or surgery.  PAIN: Are you having pain? Yes: NPRS scale: 5/10 Pain location: R foot Pain description: "very strong" burning esp at knee and ankle,  numbness and tingling in lower leg and toes Aggravating factors: walking, prolonged standing  Relieving factors: nothing - no benefit from meds, topical analgesics, ice/heat or TENS  PERTINENT HISTORY:  Arthritis - B knees, osteopenia,  bilateral SI joint dysfunction, DM, obesity  PRECAUTIONS: None  RED FLAGS: None  WEIGHT BEARING RESTRICTIONS: No  FALLS:  Has patient fallen in last 6 months? Yes. Number of falls 1  LIVING ENVIRONMENT: Lives with: lives with their spouse and lives with their daughter Lives in: House/apartment Stairs: Yes: External: 15 steps; bilateral but cannot reach both Has following equipment at home: None  OCCUPATION: Retired  PLOF: Independent and Leisure: gym 4x/wk - walking, bike, [employer] machines   PATIENT GOALS: "To take this pain off so I can sleep."   OBJECTIVE: (objective measures completed at initial evaluation unless otherwise dated)  DIAGNOSTIC FINDINGS:  No recent imaging available for lumbar spine.  03/12/21 - MR thoracic spine: IMPRESSION: Mild multilevel foraminal stenosis in the lower thoracic spine without significant canal stenosis.  02/23/21 - XR thoracic spine: IMPRESSION:  1.  No acute displaced fracture.  2.  Mild multilevel degenerative disc disease.  3.  Mild degenerative changes of the acromioclavicular joints.  4.  Cholecystectomy clips project over the right upper quadrant.   PATIENT SURVEYS:  Modified Oswestry 32 / 50 = 64.0 %  LEFS 45 / 80 = 56.3 %  SCREENING FOR RED FLAGS: Bowel or bladder incontinence: No Spinal tumors: No Cauda equina syndrome: No Compression fracture: No Abdominal aneurysm: No  COGNITION:  Overall cognitive status: Within functional limits for tasks assessed    SENSATION: Intermittent numbness in R toes  MUSCLE LENGTH: Hamstrings: Mild tight B ITB: Mild tight B Piriformis: Mod/severe tight B Hip flexors: Mild tight B Quads: Mild tight B Heelcord: NT  POSTURE:  rounded shoulders, forward head, and weight shift left  PALPATION: Increased muscle tension and TTP in R glutes and piriformis  LUMBAR ROM:   Active  Eval  Flexion WFL  Extension WFL  Right lateral flexion WFL  Left lateral flexion WFL  Right  rotation WFL - decreased pain  Left rotation WFL  (Blank rows = not tested)  LOWER EXTREMITY ROM:    B LE grossly WFL other than limited hip ER/IR  LOWER EXTREMITY MMT:    MMT Right eval Left eval  Hip flexion 5 5  Hip extension 4+ 4+  Hip abduction 4 4  Hip adduction 4 4  Hip internal rotation 5 5  Hip external rotation 4 4+  Knee flexion 5 5  Knee extension 5 5  Ankle dorsiflexion 5 5  Ankle plantarflexion    Ankle inversion    Ankle eversion     (Blank rows = not tested)  LUMBAR SPECIAL TESTS:  Straight leg raise test: Negative and Slump test: Negative  GAIT: Distance walked: Clinic distances Assistive device utilized: None Level of assistance: Complete Independence Gait pattern: decreased stance  time- Right, antalgic, and lateral lean- Left   TODAY'S TREATMENT:   01/31/23 THERAPEUTIC EXERCISE: to improve flexibility, strength and mobility.  Demonstration, verbal and tactile cues throughout for technique.  NuStep - L5 x 6 min Standing R gastroc stretch with toes on 2" block x 30"  Standing heelcord + medial/lateral stretch using doorstop under forefoot x 30" each Bridge + GTB hip ABD/ER clam 10 x 5", 2 sets S/L GTB clam x 10 bil  MANUAL THERAPY: To promote normalized muscle tension, improved flexibility, improved joint mobility, increased ROM, reduced pain, and normalize SIJ alignment .  R tarsal & metatarsal mid and hindfoot mobs Manual gastroc, peroneal and anterior tibialis stretches  MET to correct apparent slight R anterior innominate rotation of pelvis on sacrum followed by pelvic shotgun - 1 cycles - audible cavitation noted with resisted hip ADD and alignment normalized upon reassessment   01/25/23 THERAPEUTIC EXERCISE: to improve flexibility, strength and mobility.  Demonstration, verbal and tactile cues throughout for technique.  NuStep - L5 x 6 min Seated R sciatic nerve glides 2 x 10 Supine TrA and PPT 10x5"; added hip ADD ball squeeze  10x5" Hooklying figure 4 stretch 2x30" bil Bridge with GTB 2x10- 5 second hold Hooklying TrA/PPT + B GTB hip ABD/ER  2x10- 5 second hold Hooklying march with PPT GTB 10x5"  MANUAL THERAPY: To promote normalized muscle tension, improved flexibility, reduced pain, and normalize SIJ alignnment. STM/DTM & manual TPR to R lateral glutes   01/19/23 THERAPEUTIC EXERCISE: to improve flexibility, strength and mobility.  Demonstration, verbal and tactile cues throughout for technique.  NuStep - L4 x 6 min Seated R sciatic nerve glide x 10 Hooklying R KTOS piriformis stretch with light overpressure 2 x 30"  Seated R sciatic nerve glide 3 x 10 Hooklying TrA/PPT 10 x 5" Hooklying TrA/PPT + hip ADD ball squeeze isometric 10 x 5" Bridge + GTB hip ABD isometric 10 x 5" Hooklying TrA/PPT + B GTB hip ABD/ER  10 x 5"  MANUAL THERAPY: To promote normalized muscle tension, improved flexibility, reduced pain, and normalize SIJ alignnment . MET to correct apparent R anterior innominate rotation of pelvis on sacrum followed by pelvic shotgun - 2 cycles - alignment normalized after 2nd set R LE longitudinal distraction between MET sets STM/DTM & manual TPR to R lateral glutes Provided instruction in self-STM techniques to R glutes using tennis ball on wall.    PATIENT EDUCATION:  Education details: HEP progression - combining bridge with GTB clam Person educated: Patient Education method: Explanation, Demonstration, and Verbal cues Education comprehension: verbalized understanding, returned demonstration, verbal cues required, and needs further education  HOME EXERCISE PROGRAM: Access Code: [email or web address] URL: [email or web address] Date: 01/19/2023 Prepared by: Glenetta Hew  Exercises - Static Prone on Elbows  - 2-3 x daily - 7 x weekly - 2 sets - 3 reps - 30 sec hold - Prone Press Up  - 2-3 x daily - 7 x weekly - 2 sets - 10 reps - 2 sec hold - Standing Lumbar Extension at Wall - Forearms   - 2-3 x daily - 7 x weekly - 2 sets - 10 reps - 3-5 sec hold - Standing Lumbar Extension with Counter  - 2-3 x daily - 7 x weekly - 2 sets - 10 reps - 3-5 sec hold - Supine Figure 4 Piriformis Stretch  - 2-3 x daily - 7 x weekly - 3 reps - 30 sec hold - Supine Piriformis Stretch with Foot on Ground  -  2-3 x daily - 7 x weekly - 3 reps - 30 sec hold - Standing Gastroc Stretch at [employer]  - 2 x daily - 7 x weekly - 3 sets - 3 reps - 30 sec hold - Heel Toe Raises with Counter Support  - 1 x daily - 7 x weekly - 2 sets - 10 reps - Standing Hip Abduction with Counter Support  - 1 x daily - 7 x weekly - 2 sets - 10 reps - Seated Slump Nerve Glide  - 1 x daily - 7 x weekly - 2 sets - 10 reps - 3 sec hold - Supine Sciatic Nerve Glide (Mirrored)  - 1 x daily - 7 x weekly - 2 sets - 10 reps - 3 sec hold - Bridge with Resistance  - 1 x daily - 7 x weekly - 2 sets - 10 reps - 5 sec hold - Supine Hip Adduction Isometric with Ball  - 2 x daily - 7 x weekly - 2 sets - 10 reps - 5 sec hold - Hooklying Clamshell with Resistance  - 1 x daily - 7 x weekly - 2 sets - 10 reps - 3-5 sec hold - Standing Glute Med Mobilization with Small Ball on Wall  - 1 x daily - 7 x weekly - 1-2 reps - 1-2 min hold  Patient Education - TENS Therapy   ASSESSMENT:  CLINICAL IMPRESSION:  Vandora reports continued improvement in R LE radicular pain allowing her to sleep better, but notes some increased pain in her R foot today after increasing her walking at the gym over the past 2 days.  Able to alleviate R foot pain with gentle mid and hindfoot mobilization of R foot as well as calf stretching.  Reassessment of SI joint alignment revealing only slight hint of right anterior innominate rotation today which was easily corrected with MET.  Good tolerance for progression of strengthening exercises today and she reported no pain by end of session.  Gracelynne is demonstrating good progress towards her PT goals and will benefit from continued  skilled PT to address remaining pain and strength deficits to improve mobility and activity tolerance with decreased pain interference.  MD plans to reassess after 2 more weeks of PT and if pain/radicular symptoms persist, he we will have her proceed with lumbar MRI to determine potential need for injections vs surgical intervention.  OBJECTIVE IMPAIRMENTS: Abnormal gait, decreased activity tolerance, decreased endurance, decreased knowledge of condition, decreased mobility, difficulty walking, decreased ROM, decreased strength, increased fascial restrictions, impaired perceived functional ability, increased muscle spasms, impaired flexibility, impaired sensation, improper body mechanics, postural dysfunction, and pain.   ACTIVITY LIMITATIONS: carrying, lifting, sitting, standing, squatting, sleeping, stairs, transfers, bed mobility, locomotion level, and caring for others  PARTICIPATION LIMITATIONS: meal prep, cleaning, laundry, driving, community activity, and gym workouts  PERSONAL FACTORS: Past/current experiences, Time since onset of injury/illness/exacerbation, and 3+ comorbidities: Arthritis - B knees, osteopenia, bilateral SI joint dysfunction, DM, obesity  are also affecting patient's functional outcome.   REHAB POTENTIAL: Good  CLINICAL DECISION MAKING: Evolving/moderate complexity  EVALUATION COMPLEXITY: Moderate   GOALS: Goals reviewed with patient? Yes  SHORT TERM GOALS: Target date: 01/31/2023  Patient will be independent with initial HEP to improve outcomes and carryover.  Baseline: Initial HEP provided on eval Goal status: MET - 01/31/23  2.  Patient will report centralization of radicular symptoms.  Baseline: constant R radicular pain from buttock to R ankle, with intermittent numbness and tingling in R  foot/toes Goal status: MET - 01/31/23 - patient still has occasional R LE radicular pain but much less frequent and intense  LONG TERM GOALS: Target date: 02/28/2023    Patient will be independent with ongoing/advanced HEP for self-management at home.  Baseline:  Goal status: IN PROGRESS - 01/31/23 - met for current HEP  2.  Patient will report 75% improvement in R LE radicular pain to improve QOL.  Baseline: 10-12/10 Goal status: MET - 01/31/23 - 80% improvement   3.  Patient to demonstrate ability to achieve and maintain good spinal alignment/posturing and body mechanics needed for daily activities. Baseline:  Goal status: IN PROGRESS  4.  Patient will demonstrate improved B proximal LE strength to >/= 4+/5 for improved stability and ease of mobility . Baseline: Refer to above LE MMT table Goal status: IN PROGRESS  5.  Patient will report >/= 54/80 on LEFS to demonstrate improved functional ability.  Baseline: 45 / 80 = 56.3 % Goal status: IN PROGRESS   6. Patient will report </= 52% on Modified Oswestry to demonstrate improved functional ability with decreased pain interference. Baseline: 32 / 50 = 64.0 % Goal status: IN PROGRESS  7.  Patient will report ability to sleep with <25% sleep disturbance due to R LE radicular pain. Baseline: Sleep completely disturbed due to pain Goal status: MET - 01/25/23 - 80% improvement    PLAN:  PT FREQUENCY: 2x/week  PT DURATION: 6-8 weeks  PLANNED INTERVENTIONS: Therapeutic exercises, Therapeutic activity, Neuromuscular re-education, Balance training, Gait training, Patient/Family education, Self Care, Joint mobilization, Stair training, Aquatic Therapy, Dry Needling, Electrical stimulation, Spinal manipulation, Spinal mobilization, Cryotherapy, Moist heat, Taping, Traction, Ultrasound, Ionotophoresis 4mg /ml Dexamethasone, Manual therapy, and Re-evaluation  PLAN FOR NEXT SESSION:  Progress lumbopelvic flexibility and strengthening; reassess SI joint alignment PRN; MT +/- DN (patient afraid of needles) to glutes and performance as indicated and benefit noted   Marry Guan, PT 01/31/2023, 9:33  AM   Date of referral: 12/26/22 Referring provider: Ralene Cork, DO Referring diagnosis? M54.10 (ICD-10-CM) - Radiculopathy of leg Treatment diagnosis? (if different than referring diagnosis)  Radiculopathy, lumbosacral region  Other muscle spasm  Muscle weakness (generalized)  Other abnormalities of gait and mobility  What was this (referring dx) caused by? Unspecified  Nature of Condition: Initial Onset (within last 3 months)   Laterality: Rt  Current Functional Measure Score: Back Index 32 / 50 = 64.0 % ; LEFS 45 / 80 = 56.3 %  Objective measurements identify impairments when they are compared to normal values, the uninvolved extremity, and prior level of function.  [x]  Yes  []  No  Objective assessment of functional ability: Severe functional limitations   Briefly describe symptoms:  Onset of R LE radiculopathy pain ~3 weeks ago without known MOI or triggering event.  Radicular pain is nearly constant and originates in R buttock and extends down posterior lateral leg to R ankle, with intermittent numbness and tingling in R foot/toes, however red flag signs negative.  Her lumbar ROM is essentially WFL, however some proximal LE tightness noted.  Only minor proximal LE weakness noted.  Slump and SLR test negative however she does note slight improvement in her pain with extension-based exercises.  Increased muscle tension with significant TTP noted over TPs and R glutes and piriformis.    How did symptoms start: Insidious onset  Average pain intensity:  Last 24 hours: 10-12/10  Past week: 10/10  How often does the pt experience symptoms? Constantly  How  much have the symptoms interfered with usual daily activities? Extremely  How has condition changed since care began at this facility? NA - initial visit  In general, how is the patients overall health? Very Good  Onset date: ~3 weeks ago   Back screening: (When applicable)  Has your back pain spread down your  leg(s) at sometime in the last 2 weeks? [x]  Yes   []  No Have you had pain in the shoulder or neck at sometime in the past 2 weeks? []  Yes   [x]  No Have you only walks short distances because of your back pain? [x]  Yes   []  No In the past 2 weeks, heavy dress more slowly than usual because of your back pain? [x]  Yes   []  No Do you think it is not really safe for person with a condition like yours to be physically active? []  Yes   [x]  No Have worrying thoughts been going through your mind a lot of the time? []  Yes   [x]  No Do you feel that your back pain is terrible and it is never going to get any better? [x]  Yes   []  No In general, have you stopped enjoying all the things you usually enjoy? [x]  Yes   []  No Overall, how bothersome has your back pain been in the last 2 weeks? []  Not at all   []  Slightly     []  Moderate   []  Very much     [x]  Extremely

## 2023-02-01 ENCOUNTER — Other Ambulatory Visit: Payer: Self-pay | Admitting: Sports Medicine

## 2023-02-02 ENCOUNTER — Ambulatory Visit: Payer: Medicare Other | Admitting: Obstetrics and Gynecology

## 2023-02-02 ENCOUNTER — Encounter: Payer: Self-pay | Admitting: Obstetrics and Gynecology

## 2023-02-02 VITALS — BP 134/66 | HR 93 | Ht 62.0 in | Wt 170.0 lb

## 2023-02-02 DIAGNOSIS — Z01419 Encounter for gynecological examination (general) (routine) without abnormal findings: Secondary | ICD-10-CM | POA: Diagnosis not present

## 2023-02-02 NOTE — Progress Notes (Signed)
   ANNUAL EXAM Patient name: Janet Peters MRN [id number]  Date of birth: Aug 08, 1950 Chief Complaint:   No chief complaint on file.  History of Present Illness:   Janet Peters is a 72 y.o. [phone] with No LMP recorded. Patient has had a hysterectomy. being seen today for a routine annual exam.  Current complaints: None  Last pap n/a - hysterectomy for benign indication in patient's 30s Last mammogram: 07/04/22. Results were: normal Last colonoscopy: 04/15/2020. Results were: abnormal next CSY due 2025 .  Last DEXA: 01/03/22, next due 2028     02/14/2022    1:40 PM 09/21/2021    8:15 AM 09/10/2020    8:04 AM 03/14/2017    2:10 PM 02/26/2017    8:26 AM  Depression screen PHQ 2/9  Decreased Interest 0 0 0 0 0  Down, Depressed, Hopeless 0 0 0 0 0  PHQ - 2 Score 0 0 0 0 0         No data to display           Review of Systems:   Pertinent items are noted in HPI Denies any headaches, blurred vision, fatigue, shortness of breath, chest pain, abdominal pain, abnormal vaginal discharge/itching/odor/irritation, bowel movements, urination, or intercourse unless otherwise stated above. Pertinent History Reviewed:  Reviewed past medical,surgical, social and family history.  Reviewed problem list, medications and allergies. Physical Assessment:   Vitals:   02/02/23 0803  BP: 134/66  Pulse: 93  Weight: 170 lb (77.1 kg)  Height: 5\' 2"  (1.575 m)  Body mass index is 31.09 kg/m.        Physical Examination:   General appearance - well appearing, and in no distress  Mental status - alert, oriented to person, place, and time  Chest - respiratory effort normal  Heart - normal peripheral perfusion  Breasts - breasts appear normal, no suspicious masses, no skin or nipple changes or axillary nodes  Abdomen - soft, nontender, nondistended, no masses or organomegaly  Pelvic - VULVA: normal appearing vulva with no masses, tenderness or lesions  VAGINA: normal appearing vagina  with normal color and discharge, no lesions  UTERUS: surgically absent  ADNEXA: No adnexal masses or tenderness noted.  Chaperone present for exam  No results found for this or any previous visit (from the past 24 hour(s)).  Assessment & Plan:  1) Well-Woman Exam Mammogram: in 1 year or sooner if problems Colonoscopy: per GI- due 2025 Pap: n/a DEXA: due 2028  Labs/procedures today:  No orders of the defined types were placed in this encounter.  Meds: No orders of the defined types were placed in this encounter.  Follow-up: No follow-ups on file.  Lennart Pall, MD 02/02/2023 8:15 AM

## 2023-02-02 NOTE — Progress Notes (Signed)
CAP interpreter Mickel Baas.

## 2023-02-02 NOTE — Patient Instructions (Signed)
It was nice meeting you today! Everything was normal. Please let us know if you have any new questions or concerns.

## 2023-02-04 ENCOUNTER — Other Ambulatory Visit: Payer: Self-pay | Admitting: Medical

## 2023-02-07 ENCOUNTER — Ambulatory Visit: Payer: Medicare Other | Admitting: Physical Therapy

## 2023-02-07 ENCOUNTER — Encounter: Payer: Self-pay | Admitting: Sports Medicine

## 2023-02-07 ENCOUNTER — Encounter: Payer: Self-pay | Admitting: Physical Therapy

## 2023-02-07 ENCOUNTER — Ambulatory Visit: Payer: Medicare Other | Admitting: Sports Medicine

## 2023-02-07 VITALS — BP 120/70 | Ht 62.0 in | Wt 170.0 lb

## 2023-02-07 DIAGNOSIS — M25561 Pain in right knee: Secondary | ICD-10-CM | POA: Diagnosis not present

## 2023-02-07 DIAGNOSIS — M62838 Other muscle spasm: Secondary | ICD-10-CM | POA: Diagnosis not present

## 2023-02-07 DIAGNOSIS — M6281 Muscle weakness (generalized): Secondary | ICD-10-CM | POA: Diagnosis not present

## 2023-02-07 DIAGNOSIS — M25562 Pain in left knee: Secondary | ICD-10-CM

## 2023-02-07 DIAGNOSIS — G8929 Other chronic pain: Secondary | ICD-10-CM | POA: Diagnosis not present

## 2023-02-07 DIAGNOSIS — R2689 Other abnormalities of gait and mobility: Secondary | ICD-10-CM

## 2023-02-07 DIAGNOSIS — M5417 Radiculopathy, lumbosacral region: Secondary | ICD-10-CM

## 2023-02-07 NOTE — Progress Notes (Signed)
   Subjective:    Patient ID: Janet Peters, female    DOB: [birth year], 72 y.o.   MRN: [id number]  HPI chief complaint bilateral knee pain  Patient presents today with chronic bilateral knee pain.  She was recently seen for low back pain and lumbar radiculopathy but her knee pain is different than what she experienced with that.  Her low back pain is improving.  She has a follow-up appointment with Dr. Christell Constant on December 6.  In regards to her knee pain, it is primarily in the anterior knee.  No significant swelling.  X-rays of the right knee done last month showed some mild degenerative changes.  No prior knee surgeries.  Pain is worse at night.    Review of Systems As above    Objective:   Physical Exam  Well-developed, well-nourished.  No acute distress  Examination of both knees shows full range of motion.  No effusion.  There are some slight tenderness to palpation along the medial joint line on the left.  No joint line tenderness of the right knee.  Trace patellofemoral crepitus.  Knee is grossly stable to ligamentous exam.  Ambulating with normal gait.  X-ray of the right knee is as above.  Please note that this is a nonweightbearing x-ray.      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  Patient will start with topical Voltaren twice daily and I have given her some home exercises to do.  I encouraged her to continue to stay active.  She will keep her appointment with Dr. Christell Constant on December 6 as scheduled for follow-up on her right leg radiculopathy.  Follow-up with [place] as needed.  This note was dictated using Dragon naturally speaking software and may contain errors in syntax, spelling, or content which have not been identified prior to signing this note.

## 2023-02-07 NOTE — Therapy (Signed)
OUTPATIENT PHYSICAL THERAPY TREATMENT  Progress Note  Reporting Period 01/03/2023 to 02/07/2023   See note below for Objective Data and Assessment of Progress/Goals.     Patient Name: Janet Peters MRN: [id number] DOB:May 25, 1950, 72 y.o., female Today's Date: 02/07/2023  END OF SESSION:  PT End of Session - 02/07/23 0842     Visit Number 7    Date for PT Re-Evaluation 02/28/23    Authorization Type [employer]    Authorization Time Period 01/03/23 - 02/28/23    Authorization - Visit Number 7    Authorization - Number of Visits 16    Progress Note Due on Visit 16   MD PN visit #7 - 02/07/23   PT Start Time 0842    PT Stop Time 0926    PT Time Calculation (min) 44 min    Activity Tolerance Patient tolerated treatment well    Behavior During Therapy [facility] for tasks assessed/performed                 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   Bradycardia    per pt report   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 04/27/2022   Chest discomfort 11/19/2019   Diabetes mellitus due to underlying condition with unspecified complications (HCC) 01/01/2018   Diabetes mellitus without complication (HCC)    on meds   Hyperlipidemia    on meds   Mixed dyslipidemia 01/01/2018   Obesity (BMI 30.0-34.9) 07/04/2021   Osteopenia 10/2016   T score -1.3 FRAX 3.5%/0.1%   Rash and other nonspecific skin eruption 06/28/2021   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   Past Surgical History:  Procedure Laterality Date   CATARACT EXTRACTION Left    CESAREAN SECTION     x2   CHOLECYSTECTOMY, LAPAROSCOPIC  2010   KNEE ARTHROSCOPY W/ MENISCAL REPAIR Left 2011   PARTIAL HYSTERECTOMY  1988   Patient  Active Problem List   Diagnosis Date Noted   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 04/27/2022   Aortic calcification (HCC) 07/04/2021   Obesity (BMI 30.0-34.9) 07/04/2021   Rash and other nonspecific skin eruption 06/28/2021   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   Allergic rhinitis due to pollen 02/23/2021   Allergy    Arthritis    Bradycardia    Diabetes mellitus without complication (HCC)    Hyperlipidemia    Chest discomfort 11/19/2019   Mixed dyslipidemia 01/01/2018   Diabetes mellitus due to underlying condition with unspecified complications (HCC) 01/01/2018   Age-related nuclear cataract of left eye 04/24/2017   Osteopenia 10/2016    PCP: Esperanza Richters, PA-C   REFERRING PROVIDER: Ralene Cork, DO   REFERRING DIAG: M54.10 (ICD-10-CM) - Radiculopathy of leg  THERAPY DIAG:  Radiculopathy, lumbosacral region  Other muscle spasm  Muscle weakness (generalized)  Other abnormalities of gait and mobility  RATIONALE FOR EVALUATION AND TREATMENT: Rehabilitation  ONSET DATE:  Mid Sept 2024  NEXT MD VISIT: 03/02/23 with Dr. Willia Craze (orthopedics)   SUBJECTIVE:  SUBJECTIVE STATEMENT: Via interpreter, no more pain in her R foot but notes it feels more sensitive. She reports very mild but constant pain in her R upper buttock which does not interfere with her sleep or daily activities.  PAIN: Are you having pain? Yes: NPRS scale: 2/10 Pain location: midline low back / R upper buttock Pain description: dull constant Aggravating factors: bending  Relieving factors: "I can live with it"  PERTINENT HISTORY:  Arthritis - B knees, osteopenia, bilateral SI joint dysfunction, DM,  obesity  PRECAUTIONS: None  RED FLAGS: None  WEIGHT BEARING RESTRICTIONS: No  FALLS:  Has patient fallen in last 6 months? Yes. Number of falls 1  LIVING ENVIRONMENT: Lives with: lives with their spouse and lives with their daughter Lives in: House/apartment Stairs: Yes: External: 15 steps; bilateral but cannot reach both Has following equipment at home: None  OCCUPATION: Retired  PLOF: Independent and Leisure: gym 4x/wk - walking, bike, [employer] machines   PATIENT GOALS: "To take this pain off so I can sleep."   OBJECTIVE: (objective measures completed at initial evaluation unless otherwise dated)  DIAGNOSTIC FINDINGS:  No recent imaging available for lumbar spine.  03/12/21 - MR thoracic spine: IMPRESSION: Mild multilevel foraminal stenosis in the lower thoracic spine without significant canal stenosis.  02/23/21 - XR thoracic spine: IMPRESSION:  1.  No acute displaced fracture.  2.  Mild multilevel degenerative disc disease.  3.  Mild degenerative changes of the acromioclavicular joints.  4.  Cholecystectomy clips project over the right upper quadrant.   PATIENT SURVEYS:  Modified Oswestry 32 / 50 = 64.0 %  LEFS 45 / 80 = 56.3 %  SCREENING FOR RED FLAGS: Bowel or bladder incontinence: No Spinal tumors: No Cauda equina syndrome: No Compression fracture: No Abdominal aneurysm: No  COGNITION:  Overall cognitive status: Within functional limits for tasks assessed    SENSATION: Intermittent numbness in R toes  MUSCLE LENGTH: Hamstrings: Mild tight B ITB: Mild tight B Piriformis: Mod/severe tight B Hip flexors: Mild tight B Quads: Mild tight B Heelcord: NT  POSTURE:  rounded shoulders, forward head, and weight shift left  PALPATION: Increased muscle tension and TTP in R glutes and piriformis  LUMBAR ROM:   Active  Eval  Flexion WFL  Extension WFL  Right lateral flexion WFL  Left lateral flexion WFL  Right rotation WFL - decreased pain  Left  rotation WFL  (Blank rows = not tested)  LOWER EXTREMITY ROM:    B LE grossly WFL other than limited hip ER/IR  LOWER EXTREMITY MMT:    MMT Right eval Left eval  Hip flexion 5 5  Hip extension 4+ 4+  Hip abduction 4 4  Hip adduction 4 4  Hip internal rotation 5 5  Hip external rotation 4 4+  Knee flexion 5 5  Knee extension 5 5  Ankle dorsiflexion 5 5  Ankle plantarflexion    Ankle inversion    Ankle eversion     (Blank rows = not tested)  LUMBAR SPECIAL TESTS:  Straight leg raise test: Negative and Slump test: Negative  GAIT: Distance walked: Clinic distances Assistive device utilized: None Level of assistance: Complete Independence Gait pattern: decreased stance time- Right, antalgic, and lateral lean- Left   TODAY'S TREATMENT:   02/07/23 THERAPEUTIC EXERCISE: to improve flexibility, strength and mobility.  Demonstration, verbal and tactile cues throughout for technique.  NuStep - L5 x 6 min Standing hip hinge with post along spine x 10 Standing squat with post  along spine x 10 Functional squat + OH reach x 10  MANUAL THERAPY: To promote normalized muscle tension, improved flexibility, improved joint mobility, increased ROM, reduced pain, and normalize SIJ alignment.  STM/DTM to R upper glutes MET to correct apparent slight R anterior innominate rotation of pelvis on sacrum followed by pelvic shotgun - 1 cycles - audible cavitation noted with resisted hip ADD and alignment normalized upon reassessment  THERAPEUTIC ACTIVITIES: Provided education in proper posture and body mechanics for typical daily positioning and household chores to minimize strain on low back.   01/31/23 THERAPEUTIC EXERCISE: to improve flexibility, strength and mobility.  Demonstration, verbal and tactile cues throughout for technique.  NuStep - L5 x 6 min Standing R gastroc stretch with toes on 2" block x 30"  Standing heelcord + medial/lateral stretch using doorstop under forefoot x 30"  each Bridge + GTB hip ABD/ER clam 10 x 5", 2 sets S/L GTB clam x 10 bil  MANUAL THERAPY: To promote normalized muscle tension, improved flexibility, improved joint mobility, increased ROM, reduced pain, and normalize SIJ alignment .  R tarsal & metatarsal mid and hindfoot mobs Manual gastroc, peroneal and anterior tibialis stretches  MET to correct apparent slight R anterior innominate rotation of pelvis on sacrum followed by pelvic shotgun - 1 cycles - audible cavitation noted with resisted hip ADD and alignment normalized upon reassessment   01/25/23 THERAPEUTIC EXERCISE: to improve flexibility, strength and mobility.  Demonstration, verbal and tactile cues throughout for technique.  NuStep - L5 x 6 min Seated R sciatic nerve glides 2 x 10 Supine TrA and PPT 10x5"; added hip ADD ball squeeze 10x5" Hooklying figure 4 stretch 2x30" bil Bridge with GTB 2x10- 5 second hold Hooklying TrA/PPT + B GTB hip ABD/ER  2x10- 5 second hold Hooklying march with PPT GTB 10x5"  MANUAL THERAPY: To promote normalized muscle tension, improved flexibility, reduced pain, and normalize SIJ alignnment. STM/DTM & manual TPR to R lateral glutes   PATIENT EDUCATION:  Education details: posture and body mechanics for typical daily postioning, mobility and household tasks Person educated: Patient Education method: Programmer, multimedia, Demonstration, Verbal cues, and Handouts Education comprehension: verbalized understanding, returned demonstration, verbal cues required, and needs further education  HOME EXERCISE PROGRAM: Access Code: [email or web address] URL: [email or web address] Date: 02/07/2023 Prepared by: Glenetta Hew  Exercises - Static Prone on Elbows  - 2-3 x daily - 7 x weekly - 2 sets - 3 reps - 30 sec hold - Prone Press Up  - 2-3 x daily - 7 x weekly - 2 sets - 10 reps - 2 sec hold - Standing Lumbar Extension at Wall - Forearms  - 2-3 x daily - 7 x weekly - 2 sets - 10 reps - 3-5 sec hold -  Standing Lumbar Extension with Counter  - 2-3 x daily - 7 x weekly - 2 sets - 10 reps - 3-5 sec hold - Supine Figure 4 Piriformis Stretch  - 2-3 x daily - 7 x weekly - 3 reps - 30 sec hold - Supine Piriformis Stretch with Foot on Ground  - 2-3 x daily - 7 x weekly - 3 reps - 30 sec hold - Standing Gastroc Stretch at Counter  - 2 x daily - 7 x weekly - 3 sets - 3 reps - 30 sec hold - Heel Toe Raises with Counter Support  - 1 x daily - 7 x weekly - 2 sets - 10 reps - Standing Hip Abduction with Counter Support  - 1  x daily - 7 x weekly - 2 sets - 10 reps - Seated Slump Nerve Glide  - 1 x daily - 7 x weekly - 2 sets - 10 reps - 3 sec hold - Supine Sciatic Nerve Glide (Mirrored)  - 1 x daily - 7 x weekly - 2 sets - 10 reps - 3 sec hold - Bridge with Resistance  - 1 x daily - 7 x weekly - 2 sets - 10 reps - 5 sec hold - Supine Hip Adduction Isometric with Ball  - 2 x daily - 7 x weekly - 2 sets - 10 reps - 5 sec hold - Hooklying Clamshell with Resistance  - 1 x daily - 7 x weekly - 2 sets - 10 reps - 3-5 sec hold - Standing Glute Med Mobilization with Small Ball on Wall  - 1 x daily - 7 x weekly - 1-2 reps - 1-2 min hold  Patient Education - TENS Therapy - Posture and Body Mechanics   ASSESSMENT:  CLINICAL IMPRESSION:  Lillyana reports only mild R upper buttock pain today which does not interfere with her sleep or most daily activities, although she does note some difficulty with mopping and sweeping.  Provided education in proper posture and body mechanics for typical daily positioning and household chores to minimize strain on low back, emphasizing household cleaning chores with demonstration and handouts provided.  Therapeutic exercises reinforcing proper body mechanics with bending and squatting patterns.  Very slight malalignment noted in R SIJ which was easily corrected with MET.  Mikhala is demonstrating good progress towards her PT goals and will benefit from continued skilled PT to address  remaining pain and strength deficits to improve mobility and activity tolerance with decreased pain interference.  Dr. Christell Constant plans to reassess after 2 more weeks of PT and if pain/radicular symptoms persist, he we will have her proceed with lumbar MRI to determine potential need for injections vs surgical intervention.  OBJECTIVE IMPAIRMENTS: Abnormal gait, decreased activity tolerance, decreased endurance, decreased knowledge of condition, decreased mobility, difficulty walking, decreased ROM, decreased strength, increased fascial restrictions, impaired perceived functional ability, increased muscle spasms, impaired flexibility, impaired sensation, improper body mechanics, postural dysfunction, and pain.   ACTIVITY LIMITATIONS: carrying, lifting, sitting, standing, squatting, sleeping, stairs, transfers, bed mobility, locomotion level, and caring for others  PARTICIPATION LIMITATIONS: meal prep, cleaning, laundry, driving, community activity, and gym workouts  PERSONAL FACTORS: Past/current experiences, Time since onset of injury/illness/exacerbation, and 3+ comorbidities: Arthritis - B knees, osteopenia, bilateral SI joint dysfunction, DM, obesity  are also affecting patient's functional outcome.   REHAB POTENTIAL: Good  CLINICAL DECISION MAKING: Evolving/moderate complexity  EVALUATION COMPLEXITY: Moderate   GOALS: Goals reviewed with patient? Yes  SHORT TERM GOALS: Target date: 01/31/2023  Patient will be independent with initial HEP to improve outcomes and carryover.  Baseline: Initial HEP provided on eval Goal status: MET - 01/31/23  2.  Patient will report centralization of radicular symptoms.  Baseline: constant R radicular pain from buttock to R ankle, with intermittent numbness and tingling in R foot/toes Goal status: MET - 01/31/23 - patient still has occasional R LE radicular pain but much less frequent and intense  LONG TERM GOALS: Target date: 02/28/2023   Patient will be  independent with ongoing/advanced HEP for self-management at home.  Baseline:  Goal status: IN PROGRESS - 01/31/23 - met for current HEP  2.  Patient will report 75% improvement in R LE radicular pain to improve QOL.  Baseline: 10-12/10  Goal status: MET - 01/31/23 - 80% improvement   3.  Patient to demonstrate ability to achieve and maintain good spinal alignment/posturing and body mechanics needed for daily activities. Baseline:  Goal status: IN PROGRESS - 02/07/23 - reviewed education for proper posture and body mechanics for typical daily positioning and household chores to minimize strain on low back    4.  Patient will demonstrate improved B proximal LE strength to >/= 4+/5 for improved stability and ease of mobility . Baseline: Refer to above LE MMT table Goal status: IN PROGRESS  5.  Patient will report >/= 54/80 on LEFS to demonstrate improved functional ability.  Baseline: 45 / 80 = 56.3 % Goal status: IN PROGRESS   6. Patient will report </= 52% on Modified Oswestry to demonstrate improved functional ability with decreased pain interference. Baseline: 32 / 50 = 64.0 % Goal status: IN PROGRESS  7.  Patient will report ability to sleep with <25% sleep disturbance due to R LE radicular pain. Baseline: Sleep completely disturbed due to pain Goal status: MET - 01/25/23 - 80% improvement    PLAN:  PT FREQUENCY: 2x/week  PT DURATION: 6-8 weeks  PLANNED INTERVENTIONS: Therapeutic exercises, Therapeutic activity, Neuromuscular re-education, Balance training, Gait training, Patient/Family education, Self Care, Joint mobilization, Stair training, Aquatic Therapy, Dry Needling, Electrical stimulation, Spinal manipulation, Spinal mobilization, Cryotherapy, Moist heat, Taping, Traction, Ultrasound, Ionotophoresis 4mg /ml Dexamethasone, Manual therapy, and Re-evaluation  PLAN FOR NEXT SESSION:  Re-assess MMT; Progress lumbopelvic flexibility and strengthening; reassess SI joint alignment  PRN; MT +/- DN (patient afraid of needles) to glutes and performance as indicated and benefit noted   Marry Guan, PT 02/07/2023, 9:35 AM   Date of referral: 12/26/22 Referring provider: Ralene Cork, DO Referring diagnosis? M54.10 (ICD-10-CM) - Radiculopathy of leg Treatment diagnosis? (if different than referring diagnosis)  Radiculopathy, lumbosacral region  Other muscle spasm  Muscle weakness (generalized)  Other abnormalities of gait and mobility  What was this (referring dx) caused by? Unspecified  Nature of Condition: Initial Onset (within last 3 months)   Laterality: Rt  Current Functional Measure Score: Back Index 32 / 50 = 64.0 % ; LEFS 45 / 80 = 56.3 %  Objective measurements identify impairments when they are compared to normal values, the uninvolved extremity, and prior level of function.  [x]  Yes  []  No  Objective assessment of functional ability: Severe functional limitations   Briefly describe symptoms:  Onset of R LE radiculopathy pain ~3 weeks ago without known MOI or triggering event.  Radicular pain is nearly constant and originates in R buttock and extends down posterior lateral leg to R ankle, with intermittent numbness and tingling in R foot/toes, however red flag signs negative.  Her lumbar ROM is essentially WFL, however some proximal LE tightness noted.  Only minor proximal LE weakness noted.  Slump and SLR test negative however she does note slight improvement in her pain with extension-based exercises.  Increased muscle tension with significant TTP noted over TPs and R glutes and piriformis.    How did symptoms start: Insidious onset  Average pain intensity:  Last 24 hours: 10-12/10  Past week: 10/10  How often does the pt experience symptoms? Constantly  How much have the symptoms interfered with usual daily activities? Extremely  How has condition changed since care began at this facility? NA - initial visit  In general, how is the  patients overall health? Very Good  Onset date: ~3 weeks ago   Back screening: (  When applicable)  Has your back pain spread down your leg(s) at sometime in the last 2 weeks? [x]  Yes   []  No Have you had pain in the shoulder or neck at sometime in the past 2 weeks? []  Yes   [x]  No Have you only walks short distances because of your back pain? [x]  Yes   []  No In the past 2 weeks, heavy dress more slowly than usual because of your back pain? [x]  Yes   []  No Do you think it is not really safe for person with a condition like yours to be physically active? []  Yes   [x]  No Have worrying thoughts been going through your mind a lot of the time? []  Yes   [x]  No Do you feel that your back pain is terrible and it is never going to get any better? [x]  Yes   []  No In general, have you stopped enjoying all the things you usually enjoy? [x]  Yes   []  No Overall, how bothersome has your back pain been in the last 2 weeks? []  Not at all   []  Slightly     []  Moderate   []  Very much     [x]  Extremely

## 2023-02-13 ENCOUNTER — Ambulatory Visit: Payer: Medicare Other | Admitting: Physical Therapy

## 2023-02-13 ENCOUNTER — Encounter: Payer: Self-pay | Admitting: Physical Therapy

## 2023-02-13 DIAGNOSIS — R2689 Other abnormalities of gait and mobility: Secondary | ICD-10-CM

## 2023-02-13 DIAGNOSIS — M5417 Radiculopathy, lumbosacral region: Secondary | ICD-10-CM

## 2023-02-13 DIAGNOSIS — M62838 Other muscle spasm: Secondary | ICD-10-CM | POA: Diagnosis not present

## 2023-02-13 DIAGNOSIS — M6281 Muscle weakness (generalized): Secondary | ICD-10-CM | POA: Diagnosis not present

## 2023-02-13 NOTE — Therapy (Signed)
OUTPATIENT PHYSICAL THERAPY TREATMENT     Patient Name: Janet Peters MRN: [id number] DOB:[birth year], 72 y.o., female Today's Date: 02/13/2023  END OF SESSION:  PT End of Session - 02/13/23 0802     Visit Number 8    Date for PT Re-Evaluation 02/28/23    Authorization Type [employer]    Authorization Time Period 01/03/23 - 02/28/23    Authorization - Visit Number 8    Authorization - Number of Visits 16    Progress Note Due on Visit 16   MD PN visit #7 - 02/07/23   PT Start Time 0802    PT Stop Time 0844    PT Time Calculation (min) 42 min    Activity Tolerance Patient tolerated treatment well    Behavior During Therapy [facility] for tasks assessed/performed                 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   Bradycardia    per pt report   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 04/27/2022   Chest discomfort 11/19/2019   Diabetes mellitus due to underlying condition with unspecified complications (HCC) 01/01/2018   Diabetes mellitus without complication (HCC)    on meds   Hyperlipidemia    on meds   Mixed dyslipidemia 01/01/2018   Obesity (BMI 30.0-34.9) 07/04/2021   Osteopenia 10/2016   T score -1.3 FRAX 3.5%/0.1%   Rash and other nonspecific skin eruption 06/28/2021   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   Past Surgical History:  Procedure Laterality Date   CATARACT EXTRACTION Left    CESAREAN SECTION     x2   CHOLECYSTECTOMY, LAPAROSCOPIC  2010   KNEE ARTHROSCOPY W/ MENISCAL REPAIR Left 2011   PARTIAL HYSTERECTOMY  1988   Patient Active Problem List   Diagnosis Date Noted   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 04/27/2022   Aortic  calcification (HCC) 07/04/2021   Obesity (BMI 30.0-34.9) 07/04/2021   Rash and other nonspecific skin eruption 06/28/2021   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   Allergic rhinitis due to pollen 02/23/2021   Allergy    Arthritis    Bradycardia    Diabetes mellitus without complication (HCC)    Hyperlipidemia    Chest discomfort 11/19/2019   Mixed dyslipidemia 01/01/2018   Diabetes mellitus due to underlying condition with unspecified complications (HCC) 01/01/2018   Age-related nuclear cataract of left eye 04/24/2017   Osteopenia 10/2016    PCP: Esperanza Richters, PA-C   REFERRING PROVIDER: Ralene Cork, DO   REFERRING DIAG: M54.10 (ICD-10-CM) - Radiculopathy of leg  THERAPY DIAG:  Radiculopathy, lumbosacral region  Other muscle spasm  Muscle weakness (generalized)  Other abnormalities of gait and mobility  RATIONALE FOR EVALUATION AND TREATMENT: Rehabilitation  ONSET DATE:  Mid Sept 2024  NEXT MD VISIT: 03/02/23 with Dr. Willia Craze (orthopedics)   SUBJECTIVE:  SUBJECTIVE STATEMENT: Via interpreter, last night she had pain in her R ankle but none this morning. Current pain only a little bit in her R buttock. She states overall her pain is much better.  Still has the increased sensitivity in her R foot/toes.  PAIN: Are you having pain? Yes: NPRS scale: 3-4/10 Pain location: midline low back / R upper buttock Pain description: dull constant Aggravating factors: bending  Relieving factors: "I can live with it"  PERTINENT HISTORY:  Arthritis - B knees, osteopenia, bilateral SI joint dysfunction, DM, obesity  PRECAUTIONS: None  RED FLAGS: None  WEIGHT BEARING RESTRICTIONS: No  FALLS:  Has  patient fallen in last 6 months? Yes. Number of falls 1  LIVING ENVIRONMENT: Lives with: lives with their spouse and lives with their daughter Lives in: House/apartment Stairs: Yes: External: 15 steps; bilateral but cannot reach both Has following equipment at home: None  OCCUPATION: Retired  PLOF: Independent and Leisure: gym 4x/wk - walking, bike, [employer] machines   PATIENT GOALS: "To take this pain off so I can sleep."   OBJECTIVE: (objective measures completed at initial evaluation unless otherwise dated)  DIAGNOSTIC FINDINGS:  No recent imaging available for lumbar spine.  03/12/21 - MR thoracic spine: IMPRESSION: Mild multilevel foraminal stenosis in the lower thoracic spine without significant canal stenosis.  02/23/21 - XR thoracic spine: IMPRESSION:  1.  No acute displaced fracture.  2.  Mild multilevel degenerative disc disease.  3.  Mild degenerative changes of the acromioclavicular joints.  4.  Cholecystectomy clips project over the right upper quadrant.   PATIENT SURVEYS:  Modified Oswestry 32 / 50 = 64.0 %  LEFS 45 / 80 = 56.3 %  SCREENING FOR RED FLAGS: Bowel or bladder incontinence: No Spinal tumors: No Cauda equina syndrome: No Compression fracture: No Abdominal aneurysm: No  COGNITION:  Overall cognitive status: Within functional limits for tasks assessed    SENSATION: Intermittent numbness in R toes  MUSCLE LENGTH: Hamstrings: Mild tight B ITB: Mild tight B Piriformis: Mod/severe tight B Hip flexors: Mild tight B Quads: Mild tight B Heelcord: NT  POSTURE:  rounded shoulders, forward head, and weight shift left  PALPATION: Increased muscle tension and TTP in R glutes and piriformis  LUMBAR ROM:   Active  Eval  Flexion WFL  Extension WFL  Right lateral flexion WFL  Left lateral flexion WFL  Right rotation WFL - decreased pain  Left rotation WFL  (Blank rows = not tested)  LOWER EXTREMITY ROM:    B LE grossly WFL other than  limited hip ER/IR  LOWER EXTREMITY MMT:    MMT Right eval Left eval  Hip flexion 5 5  Hip extension 4+ 4+  Hip abduction 4 4  Hip adduction 4 4  Hip internal rotation 5 5  Hip external rotation 4 4+  Knee flexion 5 5  Knee extension 5 5  Ankle dorsiflexion 5 5  Ankle plantarflexion    Ankle inversion    Ankle eversion     (Blank rows = not tested)  LUMBAR SPECIAL TESTS:  Straight leg raise test: Negative and Slump test: Negative  GAIT: Distance walked: Clinic distances Assistive device utilized: None Level of assistance: Complete Independence Gait pattern: decreased stance time- Right, antalgic, and lateral lean- Left   TODAY'S TREATMENT:   02/13/23 THERAPEUTIC EXERCISE: to improve flexibility, strength and mobility.  Demonstration, verbal and tactile cues throughout for technique.  NuStep - L5 x 6 min Quadruped hip extension x 10 bil -  increased Quadruped hip ABD x 10 bil Prone alt hip extension with pillow under hips x 10  MANUAL THERAPY: To promote normalized muscle tension, improved flexibility, improved joint mobility, and reduced pain. STM/DTM to R upper glutes MET to correct apparent slight R anterior innominate rotation of pelvis on sacrum followed by pelvic shotgun - 2 cycles - audible cavitation noted with resisted hip ADD and alignment normalized upon reassessment Lumbar spine and sacral PA mobs   THERAPEUTIC ACTIVITIES: Added 1/8" cork R shoe to achieve better symmetry of B iliac crest height and therefore align spine. Advised her to utilize for trial - should notice some improvement in overall activity tolerance with standing and walking, but if increased or different pain LS region or legs, remove the lift.    02/07/23 THERAPEUTIC EXERCISE: to improve flexibility, strength and mobility.  Demonstration, verbal and tactile cues throughout for technique.  NuStep - L5 x 6 min Standing hip hinge with post along spine x 10 Standing squat with post along  spine x 10 Functional squat + OH reach x 10  MANUAL THERAPY: To promote normalized muscle tension, improved flexibility, improved joint mobility, increased ROM, reduced pain, and normalize SIJ alignment.  STM/DTM to R upper glutes MET to correct apparent slight R anterior innominate rotation of pelvis on sacrum followed by pelvic shotgun - 1 cycles - audible cavitation noted with resisted hip ADD and alignment normalized upon reassessment  THERAPEUTIC ACTIVITIES: Provided education in proper posture and body mechanics for typical daily positioning and household chores to minimize strain on low back.   01/31/23 THERAPEUTIC EXERCISE: to improve flexibility, strength and mobility.  Demonstration, verbal and tactile cues throughout for technique.  NuStep - L5 x 6 min Standing R gastroc stretch with toes on 2" block x 30"  Standing heelcord + medial/lateral stretch using doorstop under forefoot x 30" each Bridge + GTB hip ABD/ER clam 10 x 5", 2 sets S/L GTB clam x 10 bil  MANUAL THERAPY: To promote normalized muscle tension, improved flexibility, improved joint mobility, increased ROM, reduced pain, and normalize SIJ alignment .  R tarsal & metatarsal mid and hindfoot mobs Manual gastroc, peroneal and anterior tibialis stretches  MET to correct apparent slight R anterior innominate rotation of pelvis on sacrum followed by pelvic shotgun - 1 cycles - audible cavitation noted with resisted hip ADD and alignment normalized upon reassessment   PATIENT EDUCATION:  Education details:  shoe lift instructions Person educated: Patient Education method: Explanation and Verbal cues Education comprehension: verbalized understanding  HOME EXERCISE PROGRAM: Access Code: [email or web address] URL: [email or web address] Date: 02/07/2023 Prepared by: Glenetta Hew  Exercises - Static Prone on Elbows  - 2-3 x daily - 7 x weekly - 2 sets - 3 reps - 30 sec hold - Prone Press Up  - 2-3 x daily - 7 x  weekly - 2 sets - 10 reps - 2 sec hold - Standing Lumbar Extension at Wall - Forearms  - 2-3 x daily - 7 x weekly - 2 sets - 10 reps - 3-5 sec hold - Standing Lumbar Extension with Counter  - 2-3 x daily - 7 x weekly - 2 sets - 10 reps - 3-5 sec hold - Supine Figure 4 Piriformis Stretch  - 2-3 x daily - 7 x weekly - 3 reps - 30 sec hold - Supine Piriformis Stretch with Foot on Ground  - 2-3 x daily - 7 x weekly - 3 reps - 30 sec hold - Standing Gastroc Stretch at  Counter  - 2 x daily - 7 x weekly - 3 sets - 3 reps - 30 sec hold - Heel Toe Raises with Counter Support  - 1 x daily - 7 x weekly - 2 sets - 10 reps - Standing Hip Abduction with Counter Support  - 1 x daily - 7 x weekly - 2 sets - 10 reps - Seated Slump Nerve Glide  - 1 x daily - 7 x weekly - 2 sets - 10 reps - 3 sec hold - Supine Sciatic Nerve Glide (Mirrored)  - 1 x daily - 7 x weekly - 2 sets - 10 reps - 3 sec hold - Bridge with Resistance  - 1 x daily - 7 x weekly - 2 sets - 10 reps - 5 sec hold - Supine Hip Adduction Isometric with Ball  - 2 x daily - 7 x weekly - 2 sets - 10 reps - 5 sec hold - Hooklying Clamshell with Resistance  - 1 x daily - 7 x weekly - 2 sets - 10 reps - 3-5 sec hold - Standing Glute Med Mobilization with Small Ball on Wall  - 1 x daily - 7 x weekly - 1-2 reps - 1-2 min hold  Patient Education - TENS Therapy - Posture and Body Mechanics   ASSESSMENT:  CLINICAL IMPRESSION:  Anona reports overall her pain is much better. Remaining pain typically only in her R buttocks although she does note some continued sensitivity in her R foot/toes and did have some pain at her ankle last night.  SIJ reassessment revealing return of apparent R anterior innominate rotation of pelvis on sacrum which was correctable with MET, however pelvic asymmetry still evident in standing, therefore added 1/8" cork insert to R shoe to promote more neutral spine alignment. Attempted to progress gravity resisted glute strengthening in  quadruped, but limited tolerance for pressure on anterior knees - better tolerated in prone over pillow.  Jalexis will benefit from continued skilled PT to address remaining pain and strength deficits to improve mobility and activity tolerance with decreased pain interference.  Dr. Christell Constant plans to reassess on 03/02/23 after 2 more weeks of PT and if pain/radicular symptoms persist, he we will have her proceed with lumbar MRI to determine potential need for injections vs surgical intervention.  OBJECTIVE IMPAIRMENTS: Abnormal gait, decreased activity tolerance, decreased endurance, decreased knowledge of condition, decreased mobility, difficulty walking, decreased ROM, decreased strength, increased fascial restrictions, impaired perceived functional ability, increased muscle spasms, impaired flexibility, impaired sensation, improper body mechanics, postural dysfunction, and pain.   ACTIVITY LIMITATIONS: carrying, lifting, sitting, standing, squatting, sleeping, stairs, transfers, bed mobility, locomotion level, and caring for others  PARTICIPATION LIMITATIONS: meal prep, cleaning, laundry, driving, community activity, and gym workouts  PERSONAL FACTORS: Past/current experiences, Time since onset of injury/illness/exacerbation, and 3+ comorbidities: Arthritis - B knees, osteopenia, bilateral SI joint dysfunction, DM, obesity  are also affecting patient's functional outcome.   REHAB POTENTIAL: Good  CLINICAL DECISION MAKING: Evolving/moderate complexity  EVALUATION COMPLEXITY: Moderate   GOALS: Goals reviewed with patient? Yes  SHORT TERM GOALS: Target date: 01/31/2023  Patient will be independent with initial HEP to improve outcomes and carryover.  Baseline: Initial HEP provided on eval Goal status: MET - 01/31/23  2.  Patient will report centralization of radicular symptoms.  Baseline: constant R radicular pain from buttock to R ankle, with intermittent numbness and tingling in R foot/toes Goal  status: MET - 01/31/23 - patient still has occasional R LE radicular  pain but much less frequent and intense  LONG TERM GOALS: Target date: 02/28/2023   Patient will be independent with ongoing/advanced HEP for self-management at home.  Baseline:  Goal status: IN PROGRESS - 01/31/23 - met for current HEP  2.  Patient will report 75% improvement in R LE radicular pain to improve QOL.  Baseline: 10-12/10 Goal status: MET - 01/31/23 - 80% improvement   3.  Patient to demonstrate ability to achieve and maintain good spinal alignment/posturing and body mechanics needed for daily activities. Baseline:  Goal status: IN PROGRESS - 02/07/23 - reviewed education for proper posture and body mechanics for typical daily positioning and household chores to minimize strain on low back    4.  Patient will demonstrate improved B proximal LE strength to >/= 4+/5 for improved stability and ease of mobility . Baseline: Refer to above LE MMT table Goal status: IN PROGRESS  5.  Patient will report >/= 54/80 on LEFS to demonstrate improved functional ability.  Baseline: 45 / 80 = 56.3 % Goal status: IN PROGRESS   6. Patient will report </= 52% on Modified Oswestry to demonstrate improved functional ability with decreased pain interference. Baseline: 32 / 50 = 64.0 % Goal status: IN PROGRESS  7.  Patient will report ability to sleep with <25% sleep disturbance due to R LE radicular pain. Baseline: Sleep completely disturbed due to pain Goal status: MET - 01/25/23 - 80% improvement    PLAN:  PT FREQUENCY: 2x/week  PT DURATION: 6-8 weeks  PLANNED INTERVENTIONS: Therapeutic exercises, Therapeutic activity, Neuromuscular re-education, Balance training, Gait training, Patient/Family education, Self Care, Joint mobilization, Stair training, Aquatic Therapy, Dry Needling, Electrical stimulation, Spinal manipulation, Spinal mobilization, Cryotherapy, Moist heat, Taping, Traction, Ultrasound, Ionotophoresis 4mg /ml  Dexamethasone, Manual therapy, and Re-evaluation  PLAN FOR NEXT SESSION:  Re-assess MMT; Progress lumbopelvic flexibility and strengthening; reassess SI joint alignment PRN; MT +/- DN (patient afraid of needles) to glutes and performance as indicated and benefit noted   Marry Guan, PT 02/13/2023, 12:43 PM   Date of referral: 12/26/22 Referring provider: Ralene Cork, DO Referring diagnosis? M54.10 (ICD-10-CM) - Radiculopathy of leg Treatment diagnosis? (if different than referring diagnosis)  Radiculopathy, lumbosacral region  Other muscle spasm  Muscle weakness (generalized)  Other abnormalities of gait and mobility  What was this (referring dx) caused by? Unspecified  Nature of Condition: Initial Onset (within last 3 months)   Laterality: Rt  Current Functional Measure Score: Back Index 32 / 50 = 64.0 % ; LEFS 45 / 80 = 56.3 %  Objective measurements identify impairments when they are compared to normal values, the uninvolved extremity, and prior level of function.  [x]  Yes  []  No  Objective assessment of functional ability: Severe functional limitations   Briefly describe symptoms:  Onset of R LE radiculopathy pain ~3 weeks ago without known MOI or triggering event.  Radicular pain is nearly constant and originates in R buttock and extends down posterior lateral leg to R ankle, with intermittent numbness and tingling in R foot/toes, however red flag signs negative.  Her lumbar ROM is essentially WFL, however some proximal LE tightness noted.  Only minor proximal LE weakness noted.  Slump and SLR test negative however she does note slight improvement in her pain with extension-based exercises.  Increased muscle tension with significant TTP noted over TPs and R glutes and piriformis.    How did symptoms start: Insidious onset  Average pain intensity:  Last 24 hours: 10-12/10  Past  week: 10/10  How often does the pt experience symptoms? Constantly  How much have  the symptoms interfered with usual daily activities? Extremely  How has condition changed since care began at this facility? NA - initial visit  In general, how is the patients overall health? Very Good  Onset date: ~3 weeks ago   Back screening: (When applicable)  Has your back pain spread down your leg(s) at sometime in the last 2 weeks? [x]  Yes   []  No Have you had pain in the shoulder or neck at sometime in the past 2 weeks? []  Yes   [x]  No Have you only walks short distances because of your back pain? [x]  Yes   []  No In the past 2 weeks, heavy dress more slowly than usual because of your back pain? [x]  Yes   []  No Do you think it is not really safe for person with a condition like yours to be physically active? []  Yes   [x]  No Have worrying thoughts been going through your mind a lot of the time? []  Yes   [x]  No Do you feel that your back pain is terrible and it is never going to get any better? [x]  Yes   []  No In general, have you stopped enjoying all the things you usually enjoy? [x]  Yes   []  No Overall, how bothersome has your back pain been in the last 2 weeks? []  Not at all   []  Slightly     []  Moderate   []  Very much     [x]  Extremely

## 2023-02-16 ENCOUNTER — Ambulatory Visit: Payer: Medicare Other | Admitting: Physical Therapy

## 2023-02-19 ENCOUNTER — Encounter: Payer: Self-pay | Admitting: Physical Therapy

## 2023-02-19 ENCOUNTER — Ambulatory Visit: Payer: Medicare Other | Admitting: Physical Therapy

## 2023-02-19 DIAGNOSIS — R2689 Other abnormalities of gait and mobility: Secondary | ICD-10-CM | POA: Diagnosis not present

## 2023-02-19 DIAGNOSIS — M5417 Radiculopathy, lumbosacral region: Secondary | ICD-10-CM

## 2023-02-19 DIAGNOSIS — M62838 Other muscle spasm: Secondary | ICD-10-CM

## 2023-02-19 DIAGNOSIS — M6281 Muscle weakness (generalized): Secondary | ICD-10-CM

## 2023-02-19 NOTE — Therapy (Signed)
OUTPATIENT PHYSICAL THERAPY TREATMENT     Patient Name: Janet Peters MRN: [id number] DOB:1951/03/26, 72 y.o., female Today's Date: 02/19/2023  END OF SESSION:  PT End of Session - 02/19/23 0802     Visit Number 9    Date for PT Re-Evaluation 02/28/23    Authorization Type [employer]    Authorization Time Period 01/03/23 - 02/28/23    Authorization - Visit Number 9    Authorization - Number of Visits 16    Progress Note Due on Visit 16   MD PN visit #7 - 02/07/23   PT Start Time 0802    PT Stop Time 0844    PT Time Calculation (min) 42 min    Activity Tolerance Patient tolerated treatment well    Behavior During Therapy [facility] for tasks assessed/performed                  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   Bradycardia    per pt report   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 04/27/2022   Chest discomfort 11/19/2019   Diabetes mellitus due to underlying condition with unspecified complications (HCC) 01/01/2018   Diabetes mellitus without complication (HCC)    on meds   Hyperlipidemia    on meds   Mixed dyslipidemia 01/01/2018   Obesity (BMI 30.0-34.9) 07/04/2021   Osteopenia 10/2016   T score -1.3 FRAX 3.5%/0.1%   Rash and other nonspecific skin eruption 06/28/2021   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   Past Surgical History:  Procedure Laterality Date   CATARACT EXTRACTION Left    CESAREAN SECTION     x2   CHOLECYSTECTOMY, LAPAROSCOPIC  2010   KNEE ARTHROSCOPY W/ MENISCAL REPAIR Left 2011   PARTIAL HYSTERECTOMY  1988   Patient Active Problem List   Diagnosis Date Noted   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 04/27/2022   Aortic  calcification (HCC) 07/04/2021   Obesity (BMI 30.0-34.9) 07/04/2021   Rash and other nonspecific skin eruption 06/28/2021   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   Allergic rhinitis due to pollen 02/23/2021   Allergy    Arthritis    Bradycardia    Diabetes mellitus without complication (HCC)    Hyperlipidemia    Chest discomfort 11/19/2019   Mixed dyslipidemia 01/01/2018   Diabetes mellitus due to underlying condition with unspecified complications (HCC) 01/01/2018   Age-related nuclear cataract of left eye 04/24/2017   Osteopenia 10/2016    PCP: Esperanza Richters, PA-C   REFERRING PROVIDER: Ralene Cork, DO   REFERRING DIAG: M54.10 (ICD-10-CM) - Radiculopathy of leg  THERAPY DIAG:  Radiculopathy, lumbosacral region  Other muscle spasm  Muscle weakness (generalized)  Other abnormalities of gait and mobility  RATIONALE FOR EVALUATION AND TREATMENT: Rehabilitation  ONSET DATE:  Mid Sept 2024  NEXT MD VISIT: 03/02/23 with Dr. Willia Craze (orthopedics)   SUBJECTIVE:  SUBJECTIVE STATEMENT: Pt very pleased - No pain this morning.  PAIN: Are you having pain? No and Yes: NPRS scale: 0/10 Pain location: midline low back / R upper buttock Pain description: dull constant Aggravating factors: bending  Relieving factors: "I can live with it"  PERTINENT HISTORY:  Arthritis - B knees, osteopenia, bilateral SI joint dysfunction, DM, obesity  PRECAUTIONS: None  RED FLAGS: None  WEIGHT BEARING RESTRICTIONS: No  FALLS:  Has patient fallen in last 6 months? Yes. Number of falls 1  LIVING ENVIRONMENT: Lives with: lives with their spouse and lives with their daughter Lives in: House/apartment Stairs: Yes:  External: 15 steps; bilateral but cannot reach both Has following equipment at home: None  OCCUPATION: Retired  PLOF: Independent and Leisure: gym 4x/wk - walking, bike, [employer] machines   PATIENT GOALS: "To take this pain off so I can sleep."   OBJECTIVE: (objective measures completed at initial evaluation unless otherwise dated)  DIAGNOSTIC FINDINGS:  No recent imaging available for lumbar spine.  03/12/21 - MR thoracic spine: IMPRESSION: Mild multilevel foraminal stenosis in the lower thoracic spine without significant canal stenosis.  02/23/21 - XR thoracic spine: IMPRESSION:  1.  No acute displaced fracture.  2.  Mild multilevel degenerative disc disease.  3.  Mild degenerative changes of the acromioclavicular joints.  4.  Cholecystectomy clips project over the right upper quadrant.   PATIENT SURVEYS:  Modified Oswestry 32 / 50 = 64.0 %  LEFS 45 / 80 = 56.3 %  SCREENING FOR RED FLAGS: Bowel or bladder incontinence: No Spinal tumors: No Cauda equina syndrome: No Compression fracture: No Abdominal aneurysm: No  COGNITION:  Overall cognitive status: Within functional limits for tasks assessed    SENSATION: Intermittent numbness in R toes  MUSCLE LENGTH: Hamstrings: Mild tight B ITB: Mild tight B Piriformis: Mod/severe tight B Hip flexors: Mild tight B Quads: Mild tight B Heelcord: NT  POSTURE:  rounded shoulders, forward head, and weight shift left  PALPATION: Increased muscle tension and TTP in R glutes and piriformis  LUMBAR ROM:   Active  Eval  Flexion WFL  Extension WFL  Right lateral flexion WFL  Left lateral flexion WFL  Right rotation WFL - decreased pain  Left rotation WFL  (Blank rows = not tested)  LOWER EXTREMITY ROM:    B LE grossly WFL other than limited hip ER/IR  LOWER EXTREMITY MMT:    MMT Right eval Left eval  Hip flexion 5 5  Hip extension 4+ 4+  Hip abduction 4 4  Hip adduction 4 4  Hip internal rotation 5 5  Hip  external rotation 4 4+  Knee flexion 5 5  Knee extension 5 5  Ankle dorsiflexion 5 5  Ankle plantarflexion    Ankle inversion    Ankle eversion     (Blank rows = not tested)  LUMBAR SPECIAL TESTS:  Straight leg raise test: Negative and Slump test: Negative  GAIT: Distance walked: Clinic distances Assistive device utilized: None Level of assistance: Complete Independence Gait pattern: decreased stance time- Right, antalgic, and lateral lean- Left   TODAY'S TREATMENT:   02/19/23 THERAPEUTIC EXERCISE: to improve flexibility, strength and mobility.  Demonstration, verbal and tactile cues throughout for technique.  Rec Bike - L2 x 6 min Squat lift with 5# kettle bell 2 x 5  Squat lift + overhead with 5# kettle bell 2 x 5  Standing hip ABD with looped RTB at ankles x 10 bil, UE support on back of chair  for balance Standing hip extension with looped RTB at ankles x 10 bil, UE support on back of chair for balance Squat + GTB hip ABD isometric 2 x 10 Standing clam with looped RTB at knees x 10 bil S/L RTB clam x 10 bil Hooklying GTB march x 10 Bridge + GTB march x 10 Standing GTB trunk rotation x 10 bil Standing GTB rows x 10 - cues for TrA activation   02/13/23 THERAPEUTIC EXERCISE: to improve flexibility, strength and mobility.  Demonstration, verbal and tactile cues throughout for technique.  NuStep - L5 x 6 min Quadruped hip extension x 10 bil - increased Quadruped hip ABD x 10 bil Prone alt hip extension with pillow under hips x 10  MANUAL THERAPY: To promote normalized muscle tension, improved flexibility, improved joint mobility, and reduced pain. STM/DTM to R upper glutes MET to correct apparent slight R anterior innominate rotation of pelvis on sacrum followed by pelvic shotgun - 2 cycles - audible cavitation noted with resisted hip ADD and alignment normalized upon reassessment Lumbar spine and sacral PA mobs   THERAPEUTIC ACTIVITIES: Added 1/8" cork R shoe to  achieve better symmetry of B iliac crest height and therefore align spine. Advised her to utilize for trial - should notice some improvement in overall activity tolerance with standing and walking, but if increased or different pain LS region or legs, remove the lift.    02/07/23 THERAPEUTIC EXERCISE: to improve flexibility, strength and mobility.  Demonstration, verbal and tactile cues throughout for technique.  NuStep - L5 x 6 min Standing hip hinge with post along spine x 10 Standing squat with post along spine x 10 Functional squat + OH reach x 10  MANUAL THERAPY: To promote normalized muscle tension, improved flexibility, improved joint mobility, increased ROM, reduced pain, and normalize SIJ alignment.  STM/DTM to R upper glutes MET to correct apparent slight R anterior innominate rotation of pelvis on sacrum followed by pelvic shotgun - 1 cycles - audible cavitation noted with resisted hip ADD and alignment normalized upon reassessment  THERAPEUTIC ACTIVITIES: Provided education in proper posture and body mechanics for typical daily positioning and household chores to minimize strain on low back.   01/31/23 THERAPEUTIC EXERCISE: to improve flexibility, strength and mobility.  Demonstration, verbal and tactile cues throughout for technique.  NuStep - L5 x 6 min Standing R gastroc stretch with toes on 2" block x 30"  Standing heelcord + medial/lateral stretch using doorstop under forefoot x 30" each Bridge + GTB hip ABD/ER clam 10 x 5", 2 sets S/L GTB clam x 10 bil  MANUAL THERAPY: To promote normalized muscle tension, improved flexibility, improved joint mobility, increased ROM, reduced pain, and normalize SIJ alignment .  R tarsal & metatarsal mid and hindfoot mobs Manual gastroc, peroneal and anterior tibialis stretches  MET to correct apparent slight R anterior innominate rotation of pelvis on sacrum followed by pelvic shotgun - 1 cycles - audible cavitation noted with resisted  hip ADD and alignment normalized upon reassessment   PATIENT EDUCATION:  Education details:  shoe lift instructions Person educated: Patient Education method: Explanation and Verbal cues Education comprehension: verbalized understanding  HOME EXERCISE PROGRAM: Access Code: [email or web address] URL: [email or web address] Date: 02/19/2023 Prepared by: Glenetta Hew  Exercises - Static Prone on Elbows  - 2-3 x daily - 7 x weekly - 2 sets - 3 reps - 30 sec hold - Prone Press Up  - 2-3 x daily - 7 x weekly - 2 sets - 10  reps - 2 sec hold - Standing Lumbar Extension at Wall - Forearms  - 2-3 x daily - 7 x weekly - 2 sets - 10 reps - 3-5 sec hold - Standing Lumbar Extension with Counter  - 2-3 x daily - 7 x weekly - 2 sets - 10 reps - 3-5 sec hold - Supine Figure 4 Piriformis Stretch  - 2-3 x daily - 7 x weekly - 3 reps - 30 sec hold - Supine Piriformis Stretch with Foot on Ground  - 2-3 x daily - 7 x weekly - 3 reps - 30 sec hold - Standing Gastroc Stretch at [employer]  - 2 x daily - 7 x weekly - 3 sets - 3 reps - 30 sec hold - Heel Toe Raises with Counter Support  - 1 x daily - 7 x weekly - 2 sets - 10 reps - Seated Slump Nerve Glide  - 1 x daily - 7 x weekly - 2 sets - 10 reps - 3 sec hold - Supine Sciatic Nerve Glide (Mirrored)  - 1 x daily - 7 x weekly - 2 sets - 10 reps - 3 sec hold - Supine Hip Adduction Isometric with Ball  - 1 x daily - 7 x weekly - 2 sets - 10 reps - 5 sec hold - Standing Glute Med Mobilization with Small Ball on Wall  - 1 x daily - 7 x weekly - 1-2 reps - 1-2 min hold - Clam with Resistance  - 1 x daily - 7 x weekly - 2 sets - 10 reps - 3-5 sec hold - Bridge with Hip Abduction and Resistance  - 1 x daily - 7 x weekly - 2 sets - 10 reps - 5 sec hold - Standing Hip Abduction with Resistance at Ankles and Counter Support  - 1 x daily - 7 x weekly - 2 sets - 10 reps - 3 sec hold - Standing Hip Extension with Resistance at Ankles and Counter Support  - 1 x daily - 7 x  weekly - 2 sets - 10 reps - 3 sec hold  Patient Education - TENS Therapy - Posture and Body Mechanics   ASSESSMENT:  CLINICAL IMPRESSION:  Leana reports no pain this morning.  She feel like the shoe insert helped.  She still notes the most discomfort with reaching down into her cabinets or reaching overhead, therefore focused on strengthening to target these motions.  Good tolerance for all exercises with no increased pain/discomfort reported - HEP updated to reflect some of exercise progression.  She will be due for a 10th visit PN next visit at which time we will determine if continued PT necessary or if she is ready to transition to her HEP. Dr. Christell Constant plans to reassess on 03/02/23 after 2 more weeks of PT and if pain/radicular symptoms persist, he we will have her proceed with lumbar MRI to determine potential need for injections vs surgical intervention.  OBJECTIVE IMPAIRMENTS: Abnormal gait, decreased activity tolerance, decreased endurance, decreased knowledge of condition, decreased mobility, difficulty walking, decreased ROM, decreased strength, increased fascial restrictions, impaired perceived functional ability, increased muscle spasms, impaired flexibility, impaired sensation, improper body mechanics, postural dysfunction, and pain.   ACTIVITY LIMITATIONS: carrying, lifting, sitting, standing, squatting, sleeping, stairs, transfers, bed mobility, locomotion level, and caring for others  PARTICIPATION LIMITATIONS: meal prep, cleaning, laundry, driving, community activity, and gym workouts  PERSONAL FACTORS: Past/current experiences, Time since onset of injury/illness/exacerbation, and 3+ comorbidities: Arthritis - B knees, osteopenia, bilateral  SI joint dysfunction, DM, obesity  are also affecting patient's functional outcome.   REHAB POTENTIAL: Good  CLINICAL DECISION MAKING: Evolving/moderate complexity  EVALUATION COMPLEXITY: Moderate   GOALS: Goals reviewed with patient?  Yes  SHORT TERM GOALS: Target date: 01/31/2023  Patient will be independent with initial HEP to improve outcomes and carryover.  Baseline: Initial HEP provided on eval Goal status: MET - 01/31/23  2.  Patient will report centralization of radicular symptoms.  Baseline: constant R radicular pain from buttock to R ankle, with intermittent numbness and tingling in R foot/toes Goal status: MET - 01/31/23 - patient still has occasional R LE radicular pain but much less frequent and intense  LONG TERM GOALS: Target date: 02/28/2023   Patient will be independent with ongoing/advanced HEP for self-management at home.  Baseline:  Goal status: IN PROGRESS - 01/31/23 - met for current HEP  2.  Patient will report 75% improvement in R LE radicular pain to improve QOL.  Baseline: 10-12/10 Goal status: MET - 01/31/23 - 80% improvement   3.  Patient to demonstrate ability to achieve and maintain good spinal alignment/posturing and body mechanics needed for daily activities. Baseline:  Goal status: IN PROGRESS - 02/07/23 - reviewed education for proper posture and body mechanics for typical daily positioning and household chores to minimize strain on low back    4.  Patient will demonstrate improved B proximal LE strength to >/= 4+/5 for improved stability and ease of mobility . Baseline: Refer to above LE MMT table Goal status: IN PROGRESS  5.  Patient will report >/= 54/80 on LEFS to demonstrate improved functional ability.  Baseline: 45 / 80 = 56.3 % Goal status: IN PROGRESS   6. Patient will report </= 52% on Modified Oswestry to demonstrate improved functional ability with decreased pain interference. Baseline: 32 / 50 = 64.0 % Goal status: IN PROGRESS  7.  Patient will report ability to sleep with <25% sleep disturbance due to R LE radicular pain. Baseline: Sleep completely disturbed due to pain Goal status: MET - 01/25/23 - 80% improvement    PLAN:  PT FREQUENCY: 2x/week  PT DURATION:  6-8 weeks  PLANNED INTERVENTIONS: Therapeutic exercises, Therapeutic activity, Neuromuscular re-education, Balance training, Gait training, Patient/Family education, Self Care, Joint mobilization, Stair training, Aquatic Therapy, Dry Needling, Electrical stimulation, Spinal manipulation, Spinal mobilization, Cryotherapy, Moist heat, Taping, Traction, Ultrasound, Ionotophoresis 4mg /ml Dexamethasone, Manual therapy, and Re-evaluation  PLAN FOR NEXT SESSION:  10th visit PN - re-assess MMT & remaining LTGs; Progress lumbopelvic flexibility and strengthening; reassess SI joint alignment PRN; MT +/- DN (patient afraid of needles) to glutes and performance as indicated and benefit noted   Marry Guan, PT 02/19/2023, 8:55 AM   Date of referral: 12/26/22 Referring provider: Ralene Cork, DO Referring diagnosis? M54.10 (ICD-10-CM) - Radiculopathy of leg Treatment diagnosis? (if different than referring diagnosis)  Radiculopathy, lumbosacral region  Other muscle spasm  Muscle weakness (generalized)  Other abnormalities of gait and mobility  What was this (referring dx) caused by? Unspecified  Nature of Condition: Initial Onset (within last 3 months)   Laterality: Rt  Current Functional Measure Score: Back Index 32 / 50 = 64.0 % ; LEFS 45 / 80 = 56.3 %  Objective measurements identify impairments when they are compared to normal values, the uninvolved extremity, and prior level of function.  [x]  Yes  []  No  Objective assessment of functional ability: Severe functional limitations   Briefly describe symptoms:  Onset of R LE  radiculopathy pain ~3 weeks ago without known MOI or triggering event.  Radicular pain is nearly constant and originates in R buttock and extends down posterior lateral leg to R ankle, with intermittent numbness and tingling in R foot/toes, however red flag signs negative.  Her lumbar ROM is essentially WFL, however some proximal LE tightness noted.  Only minor  proximal LE weakness noted.  Slump and SLR test negative however she does note slight improvement in her pain with extension-based exercises.  Increased muscle tension with significant TTP noted over TPs and R glutes and piriformis.    How did symptoms start: Insidious onset  Average pain intensity:  Last 24 hours: 10-12/10  Past week: 10/10  How often does the pt experience symptoms? Constantly  How much have the symptoms interfered with usual daily activities? Extremely  How has condition changed since care began at this facility? NA - initial visit  In general, how is the patients overall health? Very Good  Onset date: ~3 weeks ago   Back screening: (When applicable)  Has your back pain spread down your leg(s) at sometime in the last 2 weeks? [x]  Yes   []  No Have you had pain in the shoulder or neck at sometime in the past 2 weeks? []  Yes   [x]  No Have you only walks short distances because of your back pain? [x]  Yes   []  No In the past 2 weeks, heavy dress more slowly than usual because of your back pain? [x]  Yes   []  No Do you think it is not really safe for person with a condition like yours to be physically active? []  Yes   [x]  No Have worrying thoughts been going through your mind a lot of the time? []  Yes   [x]  No Do you feel that your back pain is terrible and it is never going to get any better? [x]  Yes   []  No In general, have you stopped enjoying all the things you usually enjoy? [x]  Yes   []  No Overall, how bothersome has your back pain been in the last 2 weeks? []  Not at all   []  Slightly     []  Moderate   []  Very much     [x]  Extremely

## 2023-02-21 ENCOUNTER — Encounter: Payer: Self-pay | Admitting: Physical Therapy

## 2023-02-21 ENCOUNTER — Ambulatory Visit: Payer: Medicare Other | Admitting: Physical Therapy

## 2023-02-21 DIAGNOSIS — M6281 Muscle weakness (generalized): Secondary | ICD-10-CM

## 2023-02-21 DIAGNOSIS — R2689 Other abnormalities of gait and mobility: Secondary | ICD-10-CM

## 2023-02-21 DIAGNOSIS — M5417 Radiculopathy, lumbosacral region: Secondary | ICD-10-CM

## 2023-02-21 DIAGNOSIS — M62838 Other muscle spasm: Secondary | ICD-10-CM

## 2023-02-21 NOTE — Therapy (Signed)
OUTPATIENT PHYSICAL THERAPY TREATMENT     Patient Name: Janet Peters MRN: [id number] DOB:05-17-1950, 72 y.o., female Today's Date: 02/21/2023  END OF SESSION:  PT End of Session - 02/21/23 0803     Visit Number 10    Date for PT Re-Evaluation 02/28/23    Authorization Type [employer]    Authorization Time Period 01/03/23 - 02/28/23    Authorization - Visit Number 10    Authorization - Number of Visits 16    Progress Note Due on Visit 16   MD PN visit #7 - 02/07/23   PT Start Time 0803    PT Stop Time 0843    PT Time Calculation (min) 40 min    Activity Tolerance Patient tolerated treatment well    Behavior During Therapy [facility] for tasks assessed/performed                   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   Bradycardia    per pt report   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 04/27/2022   Chest discomfort 11/19/2019   Diabetes mellitus due to underlying condition with unspecified complications (HCC) 01/01/2018   Diabetes mellitus without complication (HCC)    on meds   Hyperlipidemia    on meds   Mixed dyslipidemia 01/01/2018   Obesity (BMI 30.0-34.9) 07/04/2021   Osteopenia 10/2016   T score -1.3 FRAX 3.5%/0.1%   Rash and other nonspecific skin eruption 06/28/2021   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   Past Surgical History:  Procedure Laterality Date   CATARACT EXTRACTION Left    CESAREAN SECTION     x2   CHOLECYSTECTOMY, LAPAROSCOPIC  2010   KNEE ARTHROSCOPY W/ MENISCAL REPAIR Left 2011   PARTIAL HYSTERECTOMY  1988   Patient Active Problem List   Diagnosis Date Noted   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 04/27/2022   Aortic  calcification (HCC) 07/04/2021   Obesity (BMI 30.0-34.9) 07/04/2021   Rash and other nonspecific skin eruption 06/28/2021   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   Allergic rhinitis due to pollen 02/23/2021   Allergy    Arthritis    Bradycardia    Diabetes mellitus without complication (HCC)    Hyperlipidemia    Chest discomfort 11/19/2019   Mixed dyslipidemia 01/01/2018   Diabetes mellitus due to underlying condition with unspecified complications (HCC) 01/01/2018   Age-related nuclear cataract of left eye 04/24/2017   Osteopenia 10/2016    PCP: Esperanza Richters, PA-C   REFERRING PROVIDER: Ralene Cork, DO   REFERRING DIAG: M54.10 (ICD-10-CM) - Radiculopathy of leg  THERAPY DIAG:  Radiculopathy, lumbosacral region  Other muscle spasm  Muscle weakness (generalized)  Other abnormalities of gait and mobility  RATIONALE FOR EVALUATION AND TREATMENT: Rehabilitation  ONSET DATE:  Mid Sept 2024  NEXT MD VISIT: 03/02/23 with Dr. Willia Craze (orthopedics)   SUBJECTIVE:  SUBJECTIVE STATEMENT: Pt reporting some increased muscle soreness in her thighs from the exercises on Monday.  PAIN: Are you having pain? No and Yes: NPRS scale:  7/10 Pain location: B thighs Pain description: muscle soreness Aggravating factors: band exercises Relieving factors: n/a  PERTINENT HISTORY:  Arthritis - B knees, osteopenia, bilateral SI joint dysfunction, DM, obesity  PRECAUTIONS: None  RED FLAGS: None  WEIGHT BEARING RESTRICTIONS: No  FALLS:  Has patient fallen in last 6 months? Yes. Number of falls 1  LIVING ENVIRONMENT: Lives with: lives with their spouse and lives with their daughter Lives in:  House/apartment Stairs: Yes: External: 15 steps; bilateral but cannot reach both Has following equipment at home: None  OCCUPATION: Retired  PLOF: Independent and Leisure: gym 4x/wk - walking, bike, [employer] machines   PATIENT GOALS: "To take this pain off so I can sleep."   OBJECTIVE: (objective measures completed at initial evaluation unless otherwise dated)  DIAGNOSTIC FINDINGS:  No recent imaging available for lumbar spine.  03/12/21 - MR thoracic spine: IMPRESSION: Mild multilevel foraminal stenosis in the lower thoracic spine without significant canal stenosis.  02/23/21 - XR thoracic spine: IMPRESSION:  1.  No acute displaced fracture.  2.  Mild multilevel degenerative disc disease.  3.  Mild degenerative changes of the acromioclavicular joints.  4.  Cholecystectomy clips project over the right upper quadrant.   PATIENT SURVEYS:  Modified Oswestry 32 / 50 = 64.0 %  LEFS 45 / 80 = 56.3 %  SCREENING FOR RED FLAGS: Bowel or bladder incontinence: No Spinal tumors: No Cauda equina syndrome: No Compression fracture: No Abdominal aneurysm: No  COGNITION:  Overall cognitive status: Within functional limits for tasks assessed    SENSATION: Intermittent numbness in R toes  MUSCLE LENGTH: Hamstrings: Mild tight B ITB: Mild tight B Piriformis: Mod/severe tight B Hip flexors: Mild tight B Quads: Mild tight B Heelcord: NT  POSTURE:  rounded shoulders, forward head, and weight shift left  PALPATION: Increased muscle tension and TTP in R glutes and piriformis  LUMBAR ROM:   Active  Eval  Flexion WFL  Extension WFL  Right lateral flexion WFL  Left lateral flexion WFL  Right rotation WFL - decreased pain  Left rotation WFL  (Blank rows = not tested)  LOWER EXTREMITY ROM:    B LE grossly WFL other than limited hip ER/IR  LOWER EXTREMITY MMT:    MMT Right eval Left eval R 02/21/23 L 02/21/23  Hip flexion 5 5 5 5   Hip extension 4+ 4+ 5 5  Hip abduction 4 4  4+ 4+  Hip adduction 4 4 4+ 4+  Hip internal rotation 5 5 5 5   Hip external rotation 4 4+ 4+ 4+  Knee flexion 5 5 5 5   Knee extension 5 5 5 5   Ankle dorsiflexion 5 5 5 5   Ankle plantarflexion      Ankle inversion      Ankle eversion       (Blank rows = not tested)  LUMBAR SPECIAL TESTS:  Straight leg raise test: Negative and Slump test: Negative  GAIT: Distance walked: Clinic distances Assistive device utilized: None Level of assistance: Complete Independence Gait pattern: decreased stance time- Right, antalgic, and lateral lean- Left   TODAY'S TREATMENT:   02/21/23 THERAPEUTIC EXERCISE: to improve flexibility, strength and mobility.  Demonstration, verbal and tactile cues throughout for technique.  NuStep - L5 x 6 min  MANUAL THERAPY: To promote normalized muscle tension, improved flexibility, and reduced pain.  STM/DTM and manual TPR to R medial upper glutes & R quads IASTM with foam roller to B quads  THERAPEUTIC ACTIVITIES: LEFS: 69 / 80 = 86.3 % Modified Oswestry:  9 / 50 = 18.0 %   02/19/23 THERAPEUTIC EXERCISE: to improve flexibility, strength and mobility.  Demonstration, verbal and tactile cues throughout for technique.  Rec Bike - L2 x 6 min Squat lift with 5# kettle bell 2 x 5  Squat lift + overhead with 5# kettle bell 2 x 5  Standing hip ABD with looped RTB at ankles x 10 bil, UE support on back of chair for balance Standing hip extension with looped RTB at ankles x 10 bil, UE support on back of chair for balance Squat + GTB hip ABD isometric 2 x 10 Standing clam with looped RTB at knees x 10 bil S/L RTB clam x 10 bil Hooklying GTB march x 10 Bridge + GTB march x 10 Standing GTB trunk rotation x 10 bil Standing GTB rows x 10 - cues for TrA activation   02/13/23 THERAPEUTIC EXERCISE: to improve flexibility, strength and mobility.  Demonstration, verbal and tactile cues throughout for technique.  NuStep - L5 x 6 min Quadruped hip extension x 10 bil -  increased Quadruped hip ABD x 10 bil Prone alt hip extension with pillow under hips x 10  MANUAL THERAPY: To promote normalized muscle tension, improved flexibility, improved joint mobility, and reduced pain. STM/DTM to R upper glutes MET to correct apparent slight R anterior innominate rotation of pelvis on sacrum followed by pelvic shotgun - 2 cycles - audible cavitation noted with resisted hip ADD and alignment normalized upon reassessment Lumbar spine and sacral PA mobs   THERAPEUTIC ACTIVITIES: Added 1/8" cork R shoe to achieve better symmetry of B iliac crest height and therefore align spine. Advised her to utilize for trial - should notice some improvement in overall activity tolerance with standing and walking, but if increased or different pain LS region or legs, remove the lift.    PATIENT EDUCATION:  Education details: progress with PT and ongoing PT POC Person educated: Patient Education method: Explanation Education comprehension: verbalized understanding  HOME EXERCISE PROGRAM: Access Code: [email or web address] URL: [email or web address] Date: 02/19/2023 Prepared by: Glenetta Hew  Exercises - Static Prone on Elbows  - 2-3 x daily - 7 x weekly - 2 sets - 3 reps - 30 sec hold - Prone Press Up  - 2-3 x daily - 7 x weekly - 2 sets - 10 reps - 2 sec hold - Standing Lumbar Extension at Wall - Forearms  - 2-3 x daily - 7 x weekly - 2 sets - 10 reps - 3-5 sec hold - Standing Lumbar Extension with Counter  - 2-3 x daily - 7 x weekly - 2 sets - 10 reps - 3-5 sec hold - Supine Figure 4 Piriformis Stretch  - 2-3 x daily - 7 x weekly - 3 reps - 30 sec hold - Supine Piriformis Stretch with Foot on Ground  - 2-3 x daily - 7 x weekly - 3 reps - 30 sec hold - Standing Gastroc Stretch at Counter  - 2 x daily - 7 x weekly - 3 sets - 3 reps - 30 sec hold - Heel Toe Raises with Counter Support  - 1 x daily - 7 x weekly - 2 sets - 10 reps - Seated Slump Nerve Glide  - 1 x daily - 7 x  weekly - 2 sets - 10 reps -  3 sec hold - Supine Sciatic Nerve Glide (Mirrored)  - 1 x daily - 7 x weekly - 2 sets - 10 reps - 3 sec hold - Supine Hip Adduction Isometric with Ball  - 1 x daily - 7 x weekly - 2 sets - 10 reps - 5 sec hold - Standing Glute Med Mobilization with Small Ball on Wall  - 1 x daily - 7 x weekly - 1-2 reps - 1-2 min hold - Clam with Resistance  - 1 x daily - 7 x weekly - 2 sets - 10 reps - 3-5 sec hold - Bridge with Hip Abduction and Resistance  - 1 x daily - 7 x weekly - 2 sets - 10 reps - 5 sec hold - Standing Hip Abduction with Resistance at Ankles and Counter Support  - 1 x daily - 7 x weekly - 2 sets - 10 reps - 3 sec hold - Standing Hip Extension with Resistance at Ankles and Counter Support  - 1 x daily - 7 x weekly - 2 sets - 10 reps - 3 sec hold  Patient Education - TENS Therapy - Posture and Body Mechanics   ASSESSMENT:  CLINICAL IMPRESSION:  Margot reports some increased muscle soreness following the exercises last session but continues to deny any radicular pain such as what brought her initially to therapy.  Able to alleviate some of the muscle soreness with STM/DTM and IASTM.  She denies any further sleep disturbance due to pain and reports no functional limitations, with both modified Oswestry and QuickDASH significantly exceeding goal values.  Her overall BLE strength is now 4+ to 5/5.  Given above gains, we will plan for final review of HEP next visit and anticipate transition to HEP +/- 30-day hold. Dr. Christell Constant plans to reassess on 03/02/23 after 2 more weeks of PT and if pain/radicular symptoms persist, he we will have her proceed with lumbar MRI to determine potential need for injections vs surgical intervention.  OBJECTIVE IMPAIRMENTS: Abnormal gait, decreased activity tolerance, decreased endurance, decreased knowledge of condition, decreased mobility, difficulty walking, decreased ROM, decreased strength, increased fascial restrictions, impaired  perceived functional ability, increased muscle spasms, impaired flexibility, impaired sensation, improper body mechanics, postural dysfunction, and pain.   ACTIVITY LIMITATIONS: carrying, lifting, sitting, standing, squatting, sleeping, stairs, transfers, bed mobility, locomotion level, and caring for others  PARTICIPATION LIMITATIONS: meal prep, cleaning, laundry, driving, community activity, and gym workouts  PERSONAL FACTORS: Past/current experiences, Time since onset of injury/illness/exacerbation, and 3+ comorbidities: Arthritis - B knees, osteopenia, bilateral SI joint dysfunction, DM, obesity  are also affecting patient's functional outcome.   REHAB POTENTIAL: Good  CLINICAL DECISION MAKING: Evolving/moderate complexity  EVALUATION COMPLEXITY: Moderate   GOALS: Goals reviewed with patient? Yes  SHORT TERM GOALS: Target date: 01/31/2023  Patient will be independent with initial HEP to improve outcomes and carryover.  Baseline: Initial HEP provided on eval Goal status: MET - 01/31/23  2.  Patient will report centralization of radicular symptoms.  Baseline: constant R radicular pain from buttock to R ankle, with intermittent numbness and tingling in R foot/toes Goal status: MET - 01/31/23 - patient still has occasional R LE radicular pain but much less frequent and intense  LONG TERM GOALS: Target date: 02/28/2023   Patient will be independent with ongoing/advanced HEP for self-management at home.  Baseline:  Goal status: IN PROGRESS - 01/31/23 - met for current HEP  2.  Patient will report 75% improvement in R LE radicular pain to improve  QOL.  Baseline: 10-12/10 Goal status: MET - 01/31/23 - 80% improvement   3.  Patient to demonstrate ability to achieve and maintain good spinal alignment/posturing and body mechanics needed for daily activities. Baseline:  Goal status: MET - 02/21/23  4.  Patient will demonstrate improved B proximal LE strength to >/= 4+/5 for improved  stability and ease of mobility . Baseline: Refer to above LE MMT table Goal status: MET - 02/21/23   5.  Patient will report >/= 54/80 on LEFS to demonstrate improved functional ability.  Baseline: 45 / 80 = 56.3 % Goal status: MET - 02/21/23 - 69 / 80 = 86.3 %  6. Patient will report </= 52% on Modified Oswestry to demonstrate improved functional ability with decreased pain interference. Baseline: 32 / 50 = 64.0 % Goal status: MET - 02/21/23 -  9 / 50 = 18.0 %  7.  Patient will report ability to sleep with <25% sleep disturbance due to R LE radicular pain. Baseline: Sleep completely disturbed due to pain Goal status: MET - 01/25/23 - 80% improvement; 02/21/23 - 100% improvement   PLAN:  PT FREQUENCY: 2x/week  PT DURATION: 6-8 weeks  PLANNED INTERVENTIONS: Therapeutic exercises, Therapeutic activity, Neuromuscular re-education, Balance training, Gait training, Patient/Family education, Self Care, Joint mobilization, Stair training, Aquatic Therapy, Dry Needling, Electrical stimulation, Spinal manipulation, Spinal mobilization, Cryotherapy, Moist heat, Taping, Traction, Ultrasound, Ionotophoresis 4mg /ml Dexamethasone, Manual therapy, and Re-evaluation  PLAN FOR NEXT SESSION: Final HEP review in preparation for transition to HEP; if pain remains well-controlled and patient has no further concerns will consider transition to HEP +/- 30-day hold   Marry Guan, PT 02/21/2023, 8:44 AM   Date of referral: 12/26/22 Referring provider: Ralene Cork, DO Referring diagnosis? M54.10 (ICD-10-CM) - Radiculopathy of leg Treatment diagnosis? (if different than referring diagnosis)  Radiculopathy, lumbosacral region  Other muscle spasm  Muscle weakness (generalized)  Other abnormalities of gait and mobility  What was this (referring dx) caused by? Unspecified  Nature of Condition: Initial Onset (within last 3 months)   Laterality: Rt  Current Functional Measure Score: Back  Index 32 / 50 = 64.0 % ; LEFS 45 / 80 = 56.3 %  Objective measurements identify impairments when they are compared to normal values, the uninvolved extremity, and prior level of function.  [x]  Yes  []  No  Objective assessment of functional ability: Severe functional limitations   Briefly describe symptoms:  Onset of R LE radiculopathy pain ~3 weeks ago without known MOI or triggering event.  Radicular pain is nearly constant and originates in R buttock and extends down posterior lateral leg to R ankle, with intermittent numbness and tingling in R foot/toes, however red flag signs negative.  Her lumbar ROM is essentially WFL, however some proximal LE tightness noted.  Only minor proximal LE weakness noted.  Slump and SLR test negative however she does note slight improvement in her pain with extension-based exercises.  Increased muscle tension with significant TTP noted over TPs and R glutes and piriformis.    How did symptoms start: Insidious onset  Average pain intensity:  Last 24 hours: 10-12/10  Past week: 10/10  How often does the pt experience symptoms? Constantly  How much have the symptoms interfered with usual daily activities? Extremely  How has condition changed since care began at this facility? NA - initial visit  In general, how is the patients overall health? Very Good  Onset date: ~3 weeks ago   Back screening: (When applicable)  Has your back pain spread down your leg(s) at sometime in the last 2 weeks? [x]  Yes   []  No Have you had pain in the shoulder or neck at sometime in the past 2 weeks? []  Yes   [x]  No Have you only walks short distances because of your back pain? [x]  Yes   []  No In the past 2 weeks, heavy dress more slowly than usual because of your back pain? [x]  Yes   []  No Do you think it is not really safe for person with a condition like yours to be physically active? []  Yes   [x]  No Have worrying thoughts been going through your mind a lot of the  time? []  Yes   [x]  No Do you feel that your back pain is terrible and it is never going to get any better? [x]  Yes   []  No In general, have you stopped enjoying all the things you usually enjoy? [x]  Yes   []  No Overall, how bothersome has your back pain been in the last 2 weeks? []  Not at all   []  Slightly     []  Moderate   []  Very much     [x]  Extremely

## 2023-02-26 ENCOUNTER — Other Ambulatory Visit (HOSPITAL_BASED_OUTPATIENT_CLINIC_OR_DEPARTMENT_OTHER): Payer: Self-pay | Admitting: Medical

## 2023-02-26 ENCOUNTER — Ambulatory Visit: Payer: Medicare Other | Attending: Family Medicine | Admitting: Physical Therapy

## 2023-02-26 ENCOUNTER — Encounter: Payer: Self-pay | Admitting: Physical Therapy

## 2023-02-26 DIAGNOSIS — M62838 Other muscle spasm: Secondary | ICD-10-CM | POA: Insufficient documentation

## 2023-02-26 DIAGNOSIS — Z139 Encounter for screening, unspecified: Secondary | ICD-10-CM

## 2023-02-26 DIAGNOSIS — R2689 Other abnormalities of gait and mobility: Secondary | ICD-10-CM | POA: Insufficient documentation

## 2023-02-26 DIAGNOSIS — M6281 Muscle weakness (generalized): Secondary | ICD-10-CM | POA: Diagnosis not present

## 2023-02-26 DIAGNOSIS — M5417 Radiculopathy, lumbosacral region: Secondary | ICD-10-CM | POA: Insufficient documentation

## 2023-02-26 NOTE — Therapy (Signed)
OUTPATIENT PHYSICAL THERAPY TREATMENT   Progress Note  Reporting Period 02/07/2023 to 02/26/2023   See note below for Objective Data and Assessment of Progress/Goals.     Patient Name: Janet Peters MRN: [id number] DOB:1950-06-22, 72 y.o., female Today's Date: 02/26/2023  END OF SESSION:  PT End of Session - 02/26/23 0847     Visit Number 11    Date for PT Re-Evaluation 02/28/23    Authorization Type [employer]    Authorization Time Period 01/03/23 - 02/28/23    Authorization - Visit Number 11    Authorization - Number of Visits 16    Progress Note Due on Visit 16   MD PN visit #7 - 02/07/23   PT Start Time 0847    PT Stop Time 0925    PT Time Calculation (min) 38 min    Activity Tolerance Patient tolerated treatment well    Behavior During Therapy [facility] for tasks assessed/performed                   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   Bradycardia    per pt report   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 04/27/2022   Chest discomfort 11/19/2019   Diabetes mellitus due to underlying condition with unspecified complications (HCC) 01/01/2018   Diabetes mellitus without complication (HCC)    on meds   Hyperlipidemia    on meds   Mixed dyslipidemia 01/01/2018   Obesity (BMI 30.0-34.9) 07/04/2021   Osteopenia 10/2016   T score -1.3 FRAX 3.5%/0.1%   Rash and other nonspecific skin eruption 06/28/2021   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   Past Surgical History:  Procedure Laterality Date   CATARACT EXTRACTION Left    CESAREAN SECTION     x2   CHOLECYSTECTOMY, LAPAROSCOPIC  2010   KNEE ARTHROSCOPY W/ MENISCAL REPAIR Left 2011   PARTIAL HYSTERECTOMY  1988    Patient Active Problem List   Diagnosis Date Noted   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 04/27/2022   Aortic calcification (HCC) 07/04/2021   Obesity (BMI 30.0-34.9) 07/04/2021   Rash and other nonspecific skin eruption 06/28/2021   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   Allergic rhinitis due to pollen 02/23/2021   Allergy    Arthritis    Bradycardia    Diabetes mellitus without complication (HCC)    Hyperlipidemia    Chest discomfort 11/19/2019   Mixed dyslipidemia 01/01/2018   Diabetes mellitus due to underlying condition with unspecified complications (HCC) 01/01/2018   Age-related nuclear cataract of left eye 04/24/2017   Osteopenia 10/2016    PCP: Esperanza Richters, PA-C   REFERRING PROVIDER: Ralene Cork, DO   REFERRING DIAG: M54.10 (ICD-10-CM) - Radiculopathy of leg  THERAPY DIAG:  Radiculopathy, lumbosacral region  Other muscle spasm  Muscle weakness (generalized)  Other abnormalities of gait and mobility  RATIONALE FOR EVALUATION AND TREATMENT: Rehabilitation  ONSET DATE:  Mid Sept 2024  NEXT MD VISIT: 03/02/23 with Dr. Willia Craze (orthopedics)   SUBJECTIVE:  SUBJECTIVE STATEMENT: Pt reports she is doing well today - no pain.  PAIN: Are you having pain? No  PERTINENT HISTORY:  Arthritis - B knees, osteopenia, bilateral SI joint dysfunction, DM, obesity  PRECAUTIONS: None  RED FLAGS: None  WEIGHT BEARING RESTRICTIONS: No  FALLS:  Has patient fallen in last 6 months? Yes. Number of falls 1  LIVING ENVIRONMENT: Lives with: lives with their spouse and lives with their daughter Lives in: House/apartment Stairs: Yes: External: 15 steps; bilateral but cannot  reach both Has following equipment at home: None  OCCUPATION: Retired  PLOF: Independent and Leisure: gym 4x/wk - walking, bike, [employer] machines   PATIENT GOALS: "To take this pain off so I can sleep."   OBJECTIVE: (objective measures completed at initial evaluation unless otherwise dated)  DIAGNOSTIC FINDINGS:  No recent imaging available for lumbar spine.  03/12/21 - MR thoracic spine: IMPRESSION: Mild multilevel foraminal stenosis in the lower thoracic spine without significant canal stenosis.  02/23/21 - XR thoracic spine: IMPRESSION:  1.  No acute displaced fracture.  2.  Mild multilevel degenerative disc disease.  3.  Mild degenerative changes of the acromioclavicular joints.  4.  Cholecystectomy clips project over the right upper quadrant.   PATIENT SURVEYS:  Modified Oswestry 32 / 50 = 64.0 %  LEFS 45 / 80 = 56.3 %  SCREENING FOR RED FLAGS: Bowel or bladder incontinence: No Spinal tumors: No Cauda equina syndrome: No Compression fracture: No Abdominal aneurysm: No  COGNITION:  Overall cognitive status: Within functional limits for tasks assessed    SENSATION: Intermittent numbness in R toes  MUSCLE LENGTH: Hamstrings: Mild tight B ITB: Mild tight B Piriformis: Mod/severe tight B Hip flexors: Mild tight B Quads: Mild tight B Heelcord: NT  POSTURE:  rounded shoulders, forward head, and weight shift left  PALPATION: Increased muscle tension and TTP in R glutes and piriformis  LUMBAR ROM:   Active  Eval  Flexion WFL  Extension WFL  Right lateral flexion WFL  Left lateral flexion WFL  Right rotation WFL - decreased pain  Left rotation WFL  (Blank rows = not tested)  LOWER EXTREMITY ROM:    B LE grossly WFL other than limited hip ER/IR  LOWER EXTREMITY MMT:    MMT Right eval Left eval R 02/21/23 L 02/21/23  Hip flexion 5 5 5 5   Hip extension 4+ 4+ 5 5  Hip abduction 4 4 4+ 4+  Hip adduction 4 4 4+ 4+  Hip internal rotation 5 5 5 5   Hip  external rotation 4 4+ 4+ 4+  Knee flexion 5 5 5 5   Knee extension 5 5 5 5   Ankle dorsiflexion 5 5 5 5   Ankle plantarflexion      Ankle inversion      Ankle eversion       (Blank rows = not tested)  LUMBAR SPECIAL TESTS:  Straight leg raise test: Negative and Slump test: Negative  GAIT: Distance walked: Clinic distances Assistive device utilized: None Level of assistance: Complete Independence Gait pattern: decreased stance time- Right, antalgic, and lateral lean- Left   TODAY'S TREATMENT:   02/26/23 THERAPEUTIC EXERCISE: to improve flexibility, strength and mobility.  Demonstration, verbal and tactile cues throughout for technique.  NuStep - L5 x 6 min HEP review Bridge + GTB hip ABD/ER clam 2 x 10 BATCA seated row 20# 2 x 10 BATCA leg press 20# 2 x 10 BATCA ankle press 20# 2 x 10 BATCA lat pull down 10# 2 x  10 BATCA chest press 10# 2 x 10   02/21/23 THERAPEUTIC EXERCISE: to improve flexibility, strength and mobility.  Demonstration, verbal and tactile cues throughout for technique.  NuStep - L5 x 6 min  MANUAL THERAPY: To promote normalized muscle tension, improved flexibility, and reduced pain.  STM/DTM and manual TPR to R medial upper glutes & R quads IASTM with foam roller to B quads  THERAPEUTIC ACTIVITIES: LEFS: 69 / 80 = 86.3 % Modified Oswestry:  9 / 50 = 18.0 %   02/19/23 THERAPEUTIC EXERCISE: to improve flexibility, strength and mobility.  Demonstration, verbal and tactile cues throughout for technique.  Rec Bike - L2 x 6 min Squat lift with 5# kettle bell 2 x 5  Squat lift + overhead with 5# kettle bell 2 x 5  Standing hip ABD with looped RTB at ankles x 10 bil, UE support on back of chair for balance Standing hip extension with looped RTB at ankles x 10 bil, UE support on back of chair for balance Squat + GTB hip ABD isometric 2 x 10 Standing clam with looped RTB at knees x 10 bil S/L RTB clam x 10 bil Hooklying GTB march x 10 Bridge + GTB march x  10 Standing GTB trunk rotation x 10 bil Standing GTB rows x 10 - cues for TrA activation   PATIENT EDUCATION:  Education details: HEP review and recommended frequency for ongoing HEP at discharge to prevent loss of gains achieved with PT Person educated: Patient Education method: Explanation Education comprehension: verbalized understanding  HOME EXERCISE PROGRAM: Access Code: [email or web address] URL: [email or web address] Date: 02/19/2023 Prepared by: Glenetta Hew  Exercises - Static Prone on Elbows  - 2-3 x daily - 7 x weekly - 2 sets - 3 reps - 30 sec hold - Prone Press Up  - 2-3 x daily - 7 x weekly - 2 sets - 10 reps - 2 sec hold - Standing Lumbar Extension at Wall - Forearms  - 2-3 x daily - 7 x weekly - 2 sets - 10 reps - 3-5 sec hold - Standing Lumbar Extension with Counter  - 2-3 x daily - 7 x weekly - 2 sets - 10 reps - 3-5 sec hold - Supine Figure 4 Piriformis Stretch  - 2-3 x daily - 7 x weekly - 3 reps - 30 sec hold - Supine Piriformis Stretch with Foot on Ground  - 2-3 x daily - 7 x weekly - 3 reps - 30 sec hold - Standing Gastroc Stretch at Counter  - 2 x daily - 7 x weekly - 3 sets - 3 reps - 30 sec hold - Heel Toe Raises with Counter Support  - 1 x daily - 7 x weekly - 2 sets - 10 reps - Seated Slump Nerve Glide  - 1 x daily - 7 x weekly - 2 sets - 10 reps - 3 sec hold - Supine Sciatic Nerve Glide (Mirrored)  - 1 x daily - 7 x weekly - 2 sets - 10 reps - 3 sec hold - Supine Hip Adduction Isometric with Ball  - 1 x daily - 7 x weekly - 2 sets - 10 reps - 5 sec hold - Standing Glute Med Mobilization with Small Ball on Wall  - 1 x daily - 7 x weekly - 1-2 reps - 1-2 min hold - Clam with Resistance  - 1 x daily - 7 x weekly - 2 sets - 10 reps - 3-5 sec hold - Bridge with  Hip Abduction and Resistance  - 1 x daily - 7 x weekly - 2 sets - 10 reps - 5 sec hold - Standing Hip Abduction with Resistance at Ankles and Counter Support  - 1 x daily - 7 x weekly - 2 sets - 10  reps - 3 sec hold - Standing Hip Extension with Resistance at Ankles and Counter Support  - 1 x daily - 7 x weekly - 2 sets - 10 reps - 3 sec hold  Patient Education - TENS Therapy - Posture and Body Mechanics   ASSESSMENT:  CLINICAL IMPRESSION:  Sabena is very pleased with her progress with no pain reported today and only some muscle soreness following exercises recently. We reviewed her HEP today as well as her current gym workout and addressed all questions concerns.  All PT goals now met and Thana feels ready to transition to her HEP but would like to remain on hold for 30-days in the event that issues arise that would necessitate a return to PT.  She will f/u with Dr. Christell Constant on 03/02/23 and if further PT needs identified, will reassess at that time.  OBJECTIVE IMPAIRMENTS: Abnormal gait, decreased activity tolerance, decreased endurance, decreased knowledge of condition, decreased mobility, difficulty walking, decreased ROM, decreased strength, increased fascial restrictions, impaired perceived functional ability, increased muscle spasms, impaired flexibility, impaired sensation, improper body mechanics, postural dysfunction, and pain.   ACTIVITY LIMITATIONS: carrying, lifting, sitting, standing, squatting, sleeping, stairs, transfers, bed mobility, locomotion level, and caring for others  PARTICIPATION LIMITATIONS: meal prep, cleaning, laundry, driving, community activity, and gym workouts  PERSONAL FACTORS: Past/current experiences, Time since onset of injury/illness/exacerbation, and 3+ comorbidities: Arthritis - B knees, osteopenia, bilateral SI joint dysfunction, DM, obesity  are also affecting patient's functional outcome.   REHAB POTENTIAL: Good  CLINICAL DECISION MAKING: Evolving/moderate complexity  EVALUATION COMPLEXITY: Moderate   GOALS: Goals reviewed with patient? Yes  SHORT TERM GOALS: Target date: 01/31/2023  Patient will be independent with initial HEP to improve  outcomes and carryover.  Baseline: Initial HEP provided on eval Goal status: MET - 01/31/23  2.  Patient will report centralization of radicular symptoms.  Baseline: constant R radicular pain from buttock to R ankle, with intermittent numbness and tingling in R foot/toes Goal status: MET - 01/31/23 - patient still has occasional R LE radicular pain but much less frequent and intense  LONG TERM GOALS: Target date: 02/28/2023   Patient will be independent with ongoing/advanced HEP for self-management at home.  Baseline:  Goal status: MET - 02/26/23 - met for current HEP  2.  Patient will report 75% improvement in R LE radicular pain to improve QOL.  Baseline: 10-12/10 Goal status: MET - 01/31/23 - 80% improvement   3.  Patient to demonstrate ability to achieve and maintain good spinal alignment/posturing and body mechanics needed for daily activities. Baseline:  Goal status: MET - 02/21/23  4.  Patient will demonstrate improved B proximal LE strength to >/= 4+/5 for improved stability and ease of mobility . Baseline: Refer to above LE MMT table Goal status: MET - 02/21/23   5.  Patient will report >/= 54/80 on LEFS to demonstrate improved functional ability.  Baseline: 45 / 80 = 56.3 % Goal status: MET - 02/21/23 - 69 / 80 = 86.3 %  6. Patient will report </= 52% on Modified Oswestry to demonstrate improved functional ability with decreased pain interference. Baseline: 32 / 50 = 64.0 % Goal status: MET - 02/21/23 -  9 / 50 = 18.0 %  7.  Patient will report ability to sleep with <25% sleep disturbance due to R LE radicular pain. Baseline: Sleep completely disturbed due to pain Goal status: MET - 01/25/23 - 80% improvement; 02/21/23 - 100% improvement   PLAN:  PT FREQUENCY: 2x/week  PT DURATION: 6-8 weeks  PLANNED INTERVENTIONS: Therapeutic exercises, Therapeutic activity, Neuromuscular re-education, Balance training, Gait training, Patient/Family education, Self Care, Joint  mobilization, Stair training, Aquatic Therapy, Dry Needling, Electrical stimulation, Spinal manipulation, Spinal mobilization, Cryotherapy, Moist heat, Taping, Traction, Ultrasound, Ionotophoresis 4mg /ml Dexamethasone, Manual therapy, and Re-evaluation  PLAN FOR NEXT SESSION:  transition to HEP + 30-day hold   Marry Guan, PT 02/26/2023, 9:30 AM   Date of referral: 12/26/22 Referring provider: Ralene Cork, DO Referring diagnosis? M54.10 (ICD-10-CM) - Radiculopathy of leg Treatment diagnosis? (if different than referring diagnosis)  Radiculopathy, lumbosacral region  Other muscle spasm  Muscle weakness (generalized)  Other abnormalities of gait and mobility  What was this (referring dx) caused by? Unspecified  Nature of Condition: Initial Onset (within last 3 months)   Laterality: Rt  Current Functional Measure Score: Back Index 32 / 50 = 64.0 % ; LEFS 45 / 80 = 56.3 %  Objective measurements identify impairments when they are compared to normal values, the uninvolved extremity, and prior level of function.  [x]  Yes  []  No  Objective assessment of functional ability: Severe functional limitations   Briefly describe symptoms:  Onset of R LE radiculopathy pain ~3 weeks ago without known MOI or triggering event.  Radicular pain is nearly constant and originates in R buttock and extends down posterior lateral leg to R ankle, with intermittent numbness and tingling in R foot/toes, however red flag signs negative.  Her lumbar ROM is essentially WFL, however some proximal LE tightness noted.  Only minor proximal LE weakness noted.  Slump and SLR test negative however she does note slight improvement in her pain with extension-based exercises.  Increased muscle tension with significant TTP noted over TPs and R glutes and piriformis.    How did symptoms start: Insidious onset  Average pain intensity:  Last 24 hours: 10-12/10  Past week: 10/10  How often does the pt experience  symptoms? Constantly  How much have the symptoms interfered with usual daily activities? Extremely  How has condition changed since care began at this facility? NA - initial visit  In general, how is the patients overall health? Very Good  Onset date: ~3 weeks ago   Back screening: (When applicable)  Has your back pain spread down your leg(s) at sometime in the last 2 weeks? [x]  Yes   []  No Have you had pain in the shoulder or neck at sometime in the past 2 weeks? []  Yes   [x]  No Have you only walks short distances because of your back pain? [x]  Yes   []  No In the past 2 weeks, heavy dress more slowly than usual because of your back pain? [x]  Yes   []  No Do you think it is not really safe for person with a condition like yours to be physically active? []  Yes   [x]  No Have worrying thoughts been going through your mind a lot of the time? []  Yes   [x]  No Do you feel that your back pain is terrible and it is never going to get any better? [x]  Yes   []  No In general, have you stopped enjoying all the things you usually enjoy? [x]   Yes   []  No Overall, how bothersome has your back pain been in the last 2 weeks? []  Not at all   []  Slightly     []  Moderate   []  Very much     [x]  Extremely

## 2023-02-28 ENCOUNTER — Ambulatory Visit: Payer: Medicare Other | Admitting: Physical Therapy

## 2023-03-02 ENCOUNTER — Ambulatory Visit: Payer: Medicare Other | Admitting: Orthopedic Surgery

## 2023-03-02 DIAGNOSIS — M5416 Radiculopathy, lumbar region: Secondary | ICD-10-CM | POA: Diagnosis not present

## 2023-03-02 NOTE — Progress Notes (Signed)
Orthopedic Spine Surgery Office Note   Assessment: Patient is a 72 y.o. female with low back pain that radiates into the right lower extremity along the lateral aspect of thigh and leg. Has improved with conservative treatment     Plan: -Patient has tried toradol, mobic, PT, gabapentin, pregabalin, hydrocodone -Since she is doing better, I told her she can wean off of the lyrica -Recommend tylenol 1000mg  up to TID for her lower back pain. Told her she can also use lidocaine patches over the painful area -Will hold off on getting an MRI since she is doing better -Patient should return to office on an as needed basis     Patient expressed understanding of the plan and all questions were answered to the patient's satisfaction.    ___________________________________________________________________________     History:   Patient is a 72 y.o. female who presents today for follow up on her lumbar spine.  Patient reports significant improvement in the right lower extremity pain.  She said she has a minor amount of pain radiating into that leg.  She still has some back pain on the right side of her lower lumbar spine.  She says it is periodic in nature.  She is still taking Lyrica.  She has no pain radiating into the left lower extremity.  She has not developed any new symptoms since she was last seen.   Treatments tried: toradol, mobic, PT, gabapentin, hydrocodone, lyrica     Physical Exam:   General: no acute distress, appears stated age Neurologic: alert, answering questions appropriately, following commands Respiratory: unlabored breathing on room air, symmetric chest rise Psychiatric: appropriate affect, normal cadence to speech     MSK (spine):   -Strength exam                                                   Left                  Right EHL                              5/5                  5/5 TA                                 5/5                  5/5 GSC                              5/5                  5/5 Knee extension            5/5                  5/5 Hip flexion                    5/5                  5/5   -Sensory exam  Sensation intact to light touch in L3-S1 nerve distributions of bilateral lower extremities   -Straight leg raise: negative bilaterally -Femoral nerve stretch test: negative bilaterally -Clonus: no beats bilaterally   Imaging: XRs of the lumbar spine from 01/03/2023 and 01/26/2023 were previously independently reviewed and interpreted, showing disc height loss at L5/S1. No fracture or dislocation seen. No evidence of instability on flexion/extension views.      Patient name: Janet Peters Patient MRN: [id number] Date of visit: 03/02/23

## 2023-04-04 ENCOUNTER — Ambulatory Visit: Payer: Medicare Other | Admitting: Obstetrics and Gynecology

## 2023-04-21 ENCOUNTER — Other Ambulatory Visit: Payer: Self-pay | Admitting: Medical

## 2023-05-18 ENCOUNTER — Ambulatory Visit: Payer: Medicare Other | Admitting: Medical

## 2023-05-18 VITALS — BP 134/60 | HR 96 | Temp 98.0°F | Resp 18 | Ht 62.0 in | Wt 173.0 lb

## 2023-05-18 DIAGNOSIS — Z794 Long term (current) use of insulin: Secondary | ICD-10-CM | POA: Diagnosis not present

## 2023-05-18 DIAGNOSIS — M65339 Trigger finger, unspecified middle finger: Secondary | ICD-10-CM

## 2023-05-18 DIAGNOSIS — R1011 Right upper quadrant pain: Secondary | ICD-10-CM | POA: Diagnosis not present

## 2023-05-18 DIAGNOSIS — E7849 Other hyperlipidemia: Secondary | ICD-10-CM

## 2023-05-18 DIAGNOSIS — E118 Type 2 diabetes mellitus with unspecified complications: Secondary | ICD-10-CM

## 2023-05-18 DIAGNOSIS — R7989 Other specified abnormal findings of blood chemistry: Secondary | ICD-10-CM

## 2023-05-18 DIAGNOSIS — I10 Essential (primary) hypertension: Secondary | ICD-10-CM | POA: Diagnosis not present

## 2023-05-18 DIAGNOSIS — K760 Fatty (change of) liver, not elsewhere classified: Secondary | ICD-10-CM

## 2023-05-18 LAB — COMPREHENSIVE METABOLIC PANEL
ALT: 18 U/L (ref 0–35)
AST: 16 U/L (ref 0–37)
Albumin: 4.4 g/dL (ref 3.5–5.2)
Alkaline Phosphatase: 87 U/L (ref 39–117)
BUN: 16 mg/dL (ref 6–23)
CO2: 27 meq/L (ref 19–32)
Calcium: 9.3 mg/dL (ref 8.4–10.5)
Chloride: 106 meq/L (ref 96–112)
Creatinine, Ser: 0.63 mg/dL (ref 0.40–1.20)
GFR: 88.41 mL/min (ref >60.00)
Glucose, Bld: 106 mg/dL — ABNORMAL HIGH (ref 70–99)
Potassium: 4 meq/L (ref 3.5–5.1)
Sodium: 142 meq/L (ref 135–145)
Total Bilirubin: 0.4 mg/dL (ref 0.2–1.2)
Total Protein: 7.5 g/dL (ref 6.0–8.3)

## 2023-05-18 LAB — MICROALBUMIN / CREATININE URINE RATIO
Creatinine,U: 183.2 mg/dL
Microalb Creat Ratio: 4.6 mg/g (ref 0.0–30.0)
Microalb, Ur: 0.8 mg/dL (ref 0.0–1.9)

## 2023-05-18 LAB — LIPID PANEL
Cholesterol: 183 mg/dL (ref 0–200)
HDL: 50.8 mg/dL (ref >39.00)
LDL Cholesterol: 97 mg/dL (ref 0–99)
NonHDL: 131.94
Total CHOL/HDL Ratio: 4
Triglycerides: 174 mg/dL — ABNORMAL HIGH (ref 0.0–149.0)
VLDL: 34.8 mg/dL (ref 0.0–40.0)

## 2023-05-18 LAB — VITAMIN D 25 HYDROXY (VIT D DEFICIENCY, FRACTURES): VITD: 31.3 ng/mL (ref 30.00–100.00)

## 2023-05-18 LAB — HEMOGLOBIN A1C: Hgb A1c MFr Bld: 6.6 % — ABNORMAL HIGH (ref 4.6–6.5)

## 2023-05-18 LAB — LIPASE: Lipase: 15 U/L (ref 11.0–59.0)

## 2023-05-18 NOTE — Progress Notes (Signed)
 Subjective:    Patient ID: Janet Peters, female    DOB: [birth year], 73 y.o.   MRN: [id number]  HPI Discussed the use of AI scribe software for clinical note transcription with the patient, who gave verbal consent to proceed.  History of Present Illness   Janet Peters is a 73 year old female with hypertension and diabetes who presents for a routine follow-up and laboratory evaluation.  Blood pressure readings at home are generally about ten points lower than those recorded in the clinic. She is currently taking Losartan 50 mg, which maintains her blood pressure around 112-115  systolic mmHg, rarely reaching 120 mmHg.  She is due for an A1c test, with her last result being 6.7%. She continues to take Metformin and Januvia for diabetes management.  She has concerns about rt middle her finger, which she cannot move well and has been painful intermittently. The pain starts in the finger and radiates to the hand, suggesting a possible trigger finger.  She requests a check of her liver enzymes, pancreatic enzymes, and other routine labs as this is her first appointment of the year. Previous normal lipase level of 14 in October 2024 and has had multiple checks over the past few years, all normal. She reports minimal  rt upper abdominal pain but no nausea or vomiting.  She inquires about her vitamin D levels to determine if she should continue supplementation. Low vit d in the past.  She has received both doses of the shingles vaccine at [facility] but has not received a report. She also discusses her COVID-19 vaccination experience, noting significant side effects from the last dose.            Review of Systems  Constitutional:  Negative for chills and fatigue.  Respiratory:  Negative for cough, chest tightness, shortness of breath and wheezing.   Cardiovascular:  Negative for chest pain and palpitations.  Gastrointestinal:  Negative for abdominal pain, blood in stool and  diarrhea.  Genitourinary:  Negative for dyspareunia, flank pain and genital sores.  Musculoskeletal:  Negative for back pain and joint swelling.  Skin:  Negative for rash.  Neurological:  Negative for dizziness and facial asymmetry.  Hematological:  Negative for adenopathy. Does not bruise/bleed easily.  Psychiatric/Behavioral:  Negative for behavioral problems and dysphoric mood.     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   Bradycardia    per pt report   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 04/27/2022   Chest discomfort 11/19/2019   Diabetes mellitus due to underlying condition with unspecified complications (HCC) 01/01/2018   Diabetes mellitus without complication (HCC)    on meds   Hyperlipidemia    on meds   Mixed dyslipidemia 01/01/2018   Obesity (BMI 30.0-34.9) 07/04/2021   Osteopenia 10/2016   T score -1.3 FRAX 3.5%/0.1%   Rash and other nonspecific skin eruption 06/28/2021   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     Social History   Socioeconomic History   Marital status: Married    Spouse name: Not on file   Number of children: Not on file   Years of education: Not on file   Highest education level: Bachelor's degree (e.g., BA, AB,  BS)  Occupational History   Not on file  Tobacco Use   Smoking status: Never   Smokeless tobacco: Never  Vaping Use   Vaping status: Never Used  Substance and Sexual Activity   Alcohol use: No   Drug use: No   Sexual activity: Yes    Partners: Male    Birth control/protection: Surgical    Comment: 1st intercourse- 23, partners - 2, married- 20 yrs, hysterectomy  Other Topics Concern   Not on file  Social History Narrative   Not on file    Social Drivers of Health   Financial Resource Strain: Medium Risk (09/05/2022)   Overall Financial Resource Strain (CARDIA)    Difficulty of Paying Living Expenses: Somewhat hard  Food Insecurity: Food Insecurity Present (05/17/2023)   Hunger Vital Sign    Worried About Running Out of Food in the Last Year: Sometimes true    Ran Out of Food in the Last Year: Sometimes true  Transportation Needs: No Transportation Needs (05/17/2023)   PRAPARE - Administrator, Civil Service (Medical): No    Lack of Transportation (Non-Medical): No  Physical Activity: Sufficiently Active (05/17/2023)   Exercise Vital Sign    Days of Exercise per Week: 4 days    Minutes of Exercise per Session: 70 min  Stress: Patient Declined (01/09/2023)   [employer] of Occupational Health - Occupational Stress Questionnaire    Feeling of Stress : Patient declined  Social Connections: Moderately Isolated (05/17/2023)   Social Connection and Isolation Panel [NHANES]    Frequency of Communication with Friends and Family: More than three times a week    Frequency of Social Gatherings with Friends and Family: Never    Attends Religious Services: Never    Diplomatic Services operational officer: No    Attends Engineer, structural: Not on file    Marital Status: Married  Catering manager Violence: Not on file    Past Surgical History:  Procedure Laterality Date   CATARACT EXTRACTION Left    CESAREAN SECTION     x2   CHOLECYSTECTOMY, LAPAROSCOPIC  2010   KNEE ARTHROSCOPY W/ MENISCAL REPAIR Left 2011   PARTIAL HYSTERECTOMY  1988    Family History  Problem Relation Age of Onset   Heart attack Maternal Grandmother    Colon polyps Neg Hx    Colon cancer Neg Hx    Esophageal cancer Neg Hx    Stomach cancer Neg Hx    Rectal cancer Neg Hx     No Known Allergies  Current Outpatient Medications on File Prior to Visit  Medication Sig Dispense Refill   ACCU-CHEK GUIDE test strip USE TO  TEST BLOOD GLUCOSE FOUR TIMES DAILY AS DIRECTED 200 strip 12   atorvastatin (LIPITOR) 10 MG tablet TAKE 1 TABLET(10 MG) BY MOUTH DAILY 90 tablet 2   Cholecalciferol (VITAMIN D3) 125 MCG (5000 UT) CAPS Take 5,000 Units by mouth daily.     clobetasol cream (TEMOVATE) 0.05 % Apply 1 Application topically 2 (two) times daily. Apply twice daily as needed. 60 g 0   diclofenac (VOLTAREN) 75 MG EC tablet Take 1 tablet twice daily with food as needed for pain. (Patient not taking: Reported on 01/10/2023) 60 tablet 0   fenofibrate (TRICOR) 48 MG tablet Take 1 tablet (48 mg total) by mouth daily. 90 tablet 3   fluticasone (FLONASE) 50 MCG/ACT nasal spray SHAKE LIQUID AND USE 2 SPRAYS IN EACH NOSTRIL DAILY 48 g 0  fluticasone (FLONASE) 50 MCG/ACT nasal spray SHAKE LIQUID AND USE 2 SPRAYS IN EACH NOSTRIL DAILY 48 g 0   gabapentin (NEURONTIN) 100 MG capsule Tome 1 o 2 capsulas por la noche. Si funciona bien, puede tomar 1 o 2 capsulas tres veces al dia. No lo tome con oxicodona. 30 capsule 0   JANUVIA 25 MG tablet TAKE 1 TABLET(25 MG) BY MOUTH DAILY 30 tablet 3   levocetirizine (XYZAL) 5 MG tablet Take 1 tablet (5 mg total) by mouth every evening. (Patient not taking: Reported on 02/02/2023) 30 tablet 3   losartan (COZAAR) 50 MG tablet Take 1 tablet (50 mg total) by mouth daily. TAKE 1 TABLET(50 MG) BY MOUTH DAILY 90 tablet 3   MAGNESIUM PO Take 1 tablet by mouth daily.     metFORMIN (GLUCOPHAGE) 500 MG tablet 2 tab in am and 1 tab pm (Patient taking differently: Take 500 mg by mouth See admin instructions. 2 tab in am and 1 tab pm) 270 tablet 3   pregabalin (LYRICA) 75 MG capsule Take 1 capsule (75 mg total) by mouth 2 (two) times daily. 60 capsule 3   No current facility-administered medications on file prior to visit.    BP 134/60   Pulse 96   Temp 98 F (36.7 C)   Resp 18   Ht 5\' 2"  (1.575 m)   Wt 173 lb (78.5 kg)   SpO2 98%   BMI 31.64 kg/m        Objective:   Physical Exam  General Mental  Status- Alert. General Appearance- Not in acute distress.   Skin General: Color- Normal Color. Moisture- Normal Moisture.  Neck Carotid Arteries- Normal color. Moisture- Normal Moisture. No carotid bruits. No JVD.  Chest and Lung Exam Auscultation: Breath Sounds:-Normal.  Cardiovascular Auscultation:Rythm- Regular. Murmurs & Other Heart Sounds:Auscultation of the heart reveals- No Murmurs.  Abdomen Inspection:-Inspeection Normal. Palpation/Percussion:Note:No mass. Palpation and Percussion of the abdomen reveal- Non Tender, Non Distended + BS, no rebound or guarding.   Neurologic Cranial Nerve exam:- CN III-XII intact(No nystagmus), symmetric smile. Strength:- 5/5 equal and symmetric strength both upper and lower extremities.   Rt hand- on exam osteoarthritis type changes. Rt 3rd did does not trigger presently.    Assessment & Plan:   Patient Instructions  Hypertension Well controlled on Losartan 50mg  daily. Home readings consistently around 110-115/70-75. Office reading slightly elevated at 134/60. -Continue Losartan 50mg  daily. -Continue home blood pressure monitoring.  Type 2 Diabetes Mellitus Last A1C was 6.7. Patient is on Metformin and Januvia. -Order A1C. -Continue Metformin and Januvia.  Vitamin D Deficiency Patient is on supplementation. -Check Vitamin D level.  Liver Enzymes Patient requested liver enzyme check. Last check in October was normal. -Order liver function tests.  Pancreatic Enzymes Patient requested pancreatic enzyme check. Last check in October was normal. -Order lipase.  Hyperlipidemia Patient requested cholesterol check. -Order lipid panel.  Urinary Microalbumin To assess for diabetic nephropathy. -Order urinary microalbumin.  Trigger Finger Patient reports difficulty moving finger and has to manually open it. -Refer to hand specialist for evaluation and treatment.  Vaccinations Patient has received Shingrix vaccine. Tdap is  due. -Recommend Tdap vaccine at pharmacy. -get copy of shingrix vaccine date.  Colonoscopy Follow-up Patient had a polyp found on previous colonoscopy. -Advise patient to call gastroenterologist for follow-up appointment.  Eye Exam Patient reports no issues with vision and no diagnosis of glaucoma or diabetic retinopathy. -Advise patient to bring copy of eye exam report to next visit.  COVID-19  Booster -discussed benefit vs risk.     Follow up to be determined after lab review.   Esperanza Richters, PA-C

## 2023-05-18 NOTE — Patient Instructions (Addendum)
 Hypertension Well controlled on Losartan 50mg  daily. Home readings consistently around 110-115/70-75. Office reading slightly elevated at 134/60. -Continue Losartan 50mg  daily. -Continue home blood pressure monitoring.  Type 2 Diabetes Mellitus Last A1C was 6.7. Patient is on Metformin and Januvia. -Order A1C. -Continue Metformin and Januvia.  Vitamin D Deficiency Patient is on supplementation. -Check Vitamin D level.  Liver Enzymes Patient requested liver enzyme check. Last check in October was normal. -Order liver function tests.  Pancreatic Enzymes(mild ruq pain). Probable fatty liver type pain. Patient requested pancreatic enzyme check. Last check in October was normal. -Order lipase.  Hyperlipidemia -Order lipid panel.  Urinary Microalbumin To assess for diabetic nephropathy. -Order urinary microalbumin.  Trigger Finger Patient reports difficulty moving finger and has to manually open it. -Refer to hand specialist for evaluation and treatment.  Vaccinations Patient has received Shingrix vaccine. Tdap is due. -Recommend Tdap vaccine at pharmacy. -get copy of shingrix vaccine date.  Colonoscopy Follow-up Patient had a polyp found on previous colonoscopy. -Advise patient to call gastroenterologist for follow-up appointment.  Eye Exam Patient reports no issues with vision and no diagnosis of glaucoma or diabetic retinopathy. -Advise patient to bring copy of eye exam report to next visit.  COVID-19 Booster -discussed benefit vs risk.     Follow up to be determined after lab review.

## 2023-05-19 ENCOUNTER — Encounter: Payer: Self-pay | Admitting: Medical

## 2023-05-22 ENCOUNTER — Ambulatory Visit: Payer: Medicare Other | Admitting: Medical

## 2023-05-23 ENCOUNTER — Ambulatory Visit: Payer: Medicare Other | Admitting: Orthopaedic Surgery

## 2023-05-23 DIAGNOSIS — M65331 Trigger finger, right middle finger: Secondary | ICD-10-CM

## 2023-05-23 MED ORDER — LIDOCAINE HCL 1 % IJ SOLN
0.3000 mL | INTRAMUSCULAR | Status: AC | PRN
Start: 2023-05-23 — End: 2023-05-23
  Administered 2023-05-23: .3 mL

## 2023-05-23 MED ORDER — METHYLPREDNISOLONE ACETATE 40 MG/ML IJ SUSP
13.3300 mg | INTRAMUSCULAR | Status: AC | PRN
Start: 2023-05-23 — End: 2023-05-23
  Administered 2023-05-23: 13.33 mg

## 2023-05-23 MED ORDER — BUPIVACAINE HCL 0.5 % IJ SOLN
0.3300 mL | INTRAMUSCULAR | Status: AC | PRN
Start: 2023-05-23 — End: 2023-05-23
  Administered 2023-05-23: .33 mL

## 2023-05-23 NOTE — Progress Notes (Signed)
 Office Visit Note   Patient: Janet Peters           Date of Birth: [birth year]           MRN: [id number] Visit Date: 05/23/2023              Requested by: Esperanza Richters, PA-C [id number] [place],  [place] [address] PCP: Esperanza Richters, PA-C   Assessment & Plan: Visit Diagnoses:  1. Trigger finger, right middle finger     Plan: Patient is a 73 year old female with right middle trigger finger.  Treatment options were explained and disease process reviewed.  She agreed to undergo a cortisone injection which she tolerated well.  I have also recommended a trigger finger splint from [employer].  She will follow-up if symptoms return.  She tolerated the injection well.  Follow-Up Instructions: No follow-ups on file.   Orders:  No orders of the defined types were placed in this encounter.  No orders of the defined types were placed in this encounter.     Procedures: Hand/UE Inj: R long A1 for trigger finger on 05/23/2023 9:02 AM Indications: pain Details: 25 G needle Medications: 0.3 mL lidocaine 1 %; 0.33 mL bupivacaine 0.5 %; 13.33 mg methylPREDNISolone acetate 40 MG/ML Outcome: tolerated well, no immediate complications Consent was given by the patient. Patient was prepped and draped in the usual sterile fashion.       Clinical Data: No additional findings.   Subjective: Chief Complaint  Patient presents with   Right Middle Finger - Pain    HPI Patient is a 73 year old Hispanic female patient.  Denies any numbness tingling or burning.  Right-hand-dominant.  Reports pain and locking with increased hand use.  Denies any injuries. Review of Systems  Constitutional: Negative.   HENT: Negative.    Eyes: Negative.   Respiratory: Negative.    Cardiovascular: Negative.   Endocrine: Negative.   Musculoskeletal: Negative.   Neurological: Negative.   Hematological: Negative.   Psychiatric/Behavioral: Negative.    All other systems reviewed and are  negative.    Objective: Vital Signs: There were no vitals taken for this visit.  Physical Exam Vitals and nursing note reviewed.  Constitutional:      Appearance: She is well-developed.  HENT:     Head: Atraumatic.     Nose: Nose normal.  Eyes:     Extraocular Movements: Extraocular movements intact.  Cardiovascular:     Pulses: Normal pulses.  Pulmonary:     Effort: Pulmonary effort is normal.  Abdominal:     Palpations: Abdomen is soft.  Musculoskeletal:     Cervical back: Neck supple.  Skin:    General: Skin is warm.     Capillary Refill: Capillary refill takes less than 2 seconds.  Neurological:     Mental Status: She is alert. Mental status is at baseline.  Psychiatric:        Behavior: Behavior normal.        Thought Content: Thought content normal.        Judgment: Judgment normal.     Ortho Exam Examination of the right hand shows tenderness along the A1 pulley.  There is slight locking.  Otherwise exam is normal. Specialty Comments:  No specialty comments available.  Imaging: No results found.   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   Allergic rhinitis due to pollen 02/23/2021   Allergy    Arthritis    Bradycardia    Diabetes mellitus without complication (HCC)    Hyperlipidemia    Chest discomfort 11/19/2019   Mixed dyslipidemia 01/01/2018   Diabetes mellitus due to underlying condition with unspecified complications (HCC) 01/01/2018   Age-related nuclear cataract of left eye 04/24/2017   Osteopenia 10/2016   Past Medical History:  Diagnosis Date   Age-related nuclear cataract of left eye 04/24/2017   Allergic  rhinitis due to pollen 02/23/2021   Allergy    seasonal allergies   Aortic calcification (HCC) 07/04/2021   Arthritis    bilateral knees   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   Bradycardia    per pt report   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 04/27/2022   Chest discomfort 11/19/2019   Diabetes mellitus due to underlying condition with unspecified complications (HCC) 01/01/2018   Diabetes mellitus without complication (HCC)    on meds   Hyperlipidemia    on meds   Mixed dyslipidemia 01/01/2018   Obesity (BMI 30.0-34.9) 07/04/2021   Osteopenia 10/2016   T score -1.3 FRAX 3.5%/0.1%   Rash and other nonspecific skin eruption 06/28/2021   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    Family History  Problem Relation Age of Onset   Heart attack Maternal Grandmother    Colon polyps Neg Hx    Colon cancer Neg Hx    Esophageal cancer Neg Hx    Stomach cancer Neg Hx    Rectal cancer Neg Hx     Past Surgical History:  Procedure Laterality Date   CATARACT EXTRACTION Left    CESAREAN SECTION     x2   CHOLECYSTECTOMY, LAPAROSCOPIC  2010   KNEE ARTHROSCOPY W/ MENISCAL REPAIR Left 2011   PARTIAL HYSTERECTOMY  1988   Social History   Occupational History   Not on file  Tobacco Use   Smoking status: Never   Smokeless tobacco: Never  Vaping Use   Vaping status: Never Used  Substance and Sexual Activity   Alcohol use: No   Drug use: No   Sexual activity: Yes    Partners: Male    Birth control/protection: Surgical    Comment: 1st intercourse- 23, partners - 2, married- 20 yrs, hysterectomy

## 2023-05-31 ENCOUNTER — Other Ambulatory Visit: Payer: Self-pay | Admitting: Medical

## 2023-06-05 ENCOUNTER — Other Ambulatory Visit: Payer: Self-pay | Admitting: Medical

## 2023-07-09 ENCOUNTER — Ambulatory Visit (INDEPENDENT_AMBULATORY_CARE_PROVIDER_SITE_OTHER): Admitting: Medical

## 2023-07-09 ENCOUNTER — Encounter: Payer: Self-pay | Admitting: Medical

## 2023-07-09 VITALS — BP 135/78 | HR 98 | Temp 98.3°F | Resp 18 | Ht 62.0 in | Wt 172.0 lb

## 2023-07-09 DIAGNOSIS — F419 Anxiety disorder, unspecified: Secondary | ICD-10-CM | POA: Diagnosis not present

## 2023-07-09 DIAGNOSIS — G44209 Tension-type headache, unspecified, not intractable: Secondary | ICD-10-CM | POA: Diagnosis not present

## 2023-07-09 DIAGNOSIS — R519 Headache, unspecified: Secondary | ICD-10-CM

## 2023-07-09 LAB — SEDIMENTATION RATE: Sed Rate: 31 mm/h — ABNORMAL HIGH (ref 0–30)

## 2023-07-09 MED ORDER — CYCLOBENZAPRINE HCL 5 MG PO TABS
ORAL_TABLET | ORAL | 0 refills | Status: DC
Start: 2023-07-09 — End: 2023-08-23

## 2023-07-09 MED ORDER — BUSPIRONE HCL 7.5 MG PO TABS: 7.5000 mg | ORAL_TABLET | Freq: Two times a day (BID) | ORAL | 0 refills | Status: DC

## 2023-07-09 NOTE — Addendum Note (Signed)
 Addended by: Serafina Damme on: 07/09/2023 09:28 AM   Modules accepted: Orders

## 2023-07-09 NOTE — Progress Notes (Signed)
 Subjective:    Patient ID: Janet Peters, female    DOB: [birth year], 73 y.o.   MRN: [id number]  HPI  Janet Peters is a 73 year old female who presents with a one-week history of intermittent headaches.  She has been experiencing intermittent headaches for the past week, characterized by mild pain rated 3 to 4 out of 10, localized to the temporal area. The headaches last about three minutes each time and occur throughout the day without disturbing her sleep. She has been using Advil for relief, but it has not been effective for the current headaches. No associated symptoms such as nausea, vomiting, blurred vision, or dizziness.  She has a history of chronic neck pain, which has recently worsened, particularly in the trapezius muscles. She attributes some of her symptoms to stress. Her stress levels have increased due to her daughter's recent cancer diagnosis and treatment, which has been a significant source of anxiety.  Her sleep pattern involves going to bed at 10 PM, waking up at 2 AM, and then falling back asleep until 5 AM when she wakes up to assist her daughter and prepare for the gym. She regularly monitors her blood sugar, which remains stable, with a recent reading of 96 after exercising.  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   Bradycardia    per pt report   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 04/27/2022   Chest discomfort 11/19/2019   Diabetes mellitus due to underlying condition with unspecified complications (HCC) 01/01/2018   Diabetes mellitus without complication (HCC)    on meds   Hyperlipidemia    on meds   Mixed dyslipidemia 01/01/2018   Obesity (BMI 30.0-34.9) 07/04/2021    Osteopenia 10/2016   T score -1.3 FRAX 3.5%/0.1%   Rash and other nonspecific skin eruption 06/28/2021   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     Social History   Socioeconomic History   Marital status: Married    Spouse name: Not on file   Number of children: Not on file   Years of education: Not on file   Highest education level: Bachelor's degree (e.g., BA, AB, BS)  Occupational History   Not on file  Tobacco Use   Smoking status: Never   Smokeless tobacco: Never  Vaping Use   Vaping status: Never Used  Substance and Sexual Activity   Alcohol use: No   Drug use: No   Sexual activity: Yes    Partners: Male    Birth control/protection: Surgical    Comment: 1st intercourse- 23, partners - 2, married- 20 yrs, hysterectomy  Other Topics Concern   Not on file  Social History Narrative   Not on file   Social Drivers of Health   Financial Resource Strain: Medium Risk (09/05/2022)   Overall Financial Resource Strain (CARDIA)    Difficulty of Paying Living Expenses: Somewhat hard  Food Insecurity: Food Insecurity Present (05/17/2023)   Hunger Vital Sign    Worried About Running Out of Food in the Last Year: Sometimes true    Ran Out of Food in the Last Year: Sometimes true  Transportation Needs: No Transportation Needs (05/17/2023)   PRAPARE - Transportation    Lack of  Transportation (Medical): No    Lack of Transportation (Non-Medical): No  Physical Activity: Sufficiently Active (05/17/2023)   Exercise Vital Sign    Days of Exercise per Week: 4 days    Minutes of Exercise per Session: 70 min  Stress: Patient Declined (01/09/2023)   [employer] of Occupational Health - Occupational Stress Questionnaire    Feeling of Stress : Patient declined  Social Connections: Moderately Isolated (05/17/2023)   Social Connection and Isolation Panel [NHANES]    Frequency of Communication with Friends and Family: More than three times a week    Frequency of Social  Gatherings with Friends and Family: Never    Attends Religious Services: Never    Diplomatic Services operational officer: No    Attends Engineer, structural: Not on file    Marital Status: Married  Catering manager Violence: Not on file    Past Surgical History:  Procedure Laterality Date   CATARACT EXTRACTION Left    CESAREAN SECTION     x2   CHOLECYSTECTOMY, LAPAROSCOPIC  2010   KNEE ARTHROSCOPY W/ MENISCAL REPAIR Left 2011   PARTIAL HYSTERECTOMY  1988    Family History  Problem Relation Age of Onset   Heart attack Maternal Grandmother    Colon polyps Neg Hx    Colon cancer Neg Hx    Esophageal cancer Neg Hx    Stomach cancer Neg Hx    Rectal cancer Neg Hx     No Known Allergies  Current Outpatient Medications on File Prior to Visit  Medication Sig Dispense Refill   ACCU-CHEK GUIDE test strip USE TO TEST BLOOD GLUCOSE FOUR TIMES DAILY AS DIRECTED 200 strip 12   atorvastatin (LIPITOR) 10 MG tablet TAKE 1 TABLET(10 MG) BY MOUTH DAILY 90 tablet 2   Cholecalciferol (VITAMIN D3) 125 MCG (5000 UT) CAPS Take 5,000 Units by mouth daily.     clobetasol cream (TEMOVATE) 0.05 % Apply 1 Application topically 2 (two) times daily. Apply twice daily as needed. 60 g 0   diclofenac (VOLTAREN) 75 MG EC tablet Take 1 tablet twice daily with food as needed for pain. (Patient not taking: Reported on 01/10/2023) 60 tablet 0   fenofibrate (TRICOR) 48 MG tablet Take 1 tablet (48 mg total) by mouth daily. 90 tablet 3   fluticasone (FLONASE) 50 MCG/ACT nasal spray SHAKE LIQUID AND USE 2 SPRAYS IN EACH NOSTRIL DAILY 48 g 0   fluticasone (FLONASE) 50 MCG/ACT nasal spray SHAKE LIQUID AND USE 2 SPRAYS IN EACH NOSTRIL DAILY 48 g 0   gabapentin (NEURONTIN) 100 MG capsule Tome 1 o 2 capsulas por la noche. Si funciona bien, puede tomar 1 o 2 capsulas tres veces al dia. No lo tome con oxicodona. 30 capsule 0   JANUVIA 25 MG tablet TAKE 1 TABLET(25 MG) BY MOUTH DAILY 30 tablet 3   levocetirizine  (XYZAL) 5 MG tablet Take 1 tablet (5 mg total) by mouth every evening. (Patient not taking: Reported on 02/02/2023) 30 tablet 3   losartan (COZAAR) 50 MG tablet Take 1 tablet (50 mg total) by mouth daily. TAKE 1 TABLET(50 MG) BY MOUTH DAILY 90 tablet 3   MAGNESIUM PO Take 1 tablet by mouth daily.     metFORMIN (GLUCOPHAGE) 500 MG tablet TAKE 2 TABLETS BY MOUTH EVERY MORNING AND 1 IN PM 270 tablet 1   pregabalin (LYRICA) 75 MG capsule Take 1 capsule (75 mg total) by mouth 2 (two) times daily. 60 capsule 3   No current  facility-administered medications on file prior to visit.    BP 135/78   Pulse 98   Temp 98.3 F (36.8 C)   Resp 18   Ht 5\' 2"  (1.575 m)   Wt 172 lb (78 kg)   SpO2 98%   BMI 31.46 kg/m        Review of Systems  Constitutional:  Negative for chills and fever.  HENT:  Negative for congestion and ear pain.   Respiratory:  Negative for cough, chest tightness, wheezing and stridor.   Cardiovascular:  Negative for chest pain and palpitations.  Gastrointestinal:  Negative for abdominal pain.  Genitourinary:  Negative for dysuria.  Musculoskeletal:  Positive for neck pain. Negative for back pain and myalgias.  Skin:  Negative for rash.  Neurological:  Negative for dizziness, seizures, syncope and light-headedness.  Hematological:  Negative for adenopathy. Does not bruise/bleed easily.  Psychiatric/Behavioral:  Negative for behavioral problems, decreased concentration, hallucinations and suicidal ideas. The patient is not nervous/anxious.             Objective:   Physical Exam  General Mental Status- Alert. General Appearance- Not in acute distress.   Skin General: Color- Normal Color. Moisture- Normal Moisture. Temporal areas are normal.  Neck  Bilateral trapezius tenderness. No neck stiffness  Chest and Lung Exam Auscultation: Breath Sounds:-Normal.  Cardiovascular Auscultation:Rythm- Regular. Murmurs & Other Heart Sounds:Auscultation of the heart  reveals- No Murmurs.  Abdomen Inspection:-Inspeection Normal. Palpation/Percussion:Note:No mass. Palpation and Percussion of the abdomen reveal- Non Tender, Non Distended + BS, no rebound or guarding.    Neurologic Cranial Nerve exam:- CN III-XII intact(No nystagmus), symmetric smile. Strength:- 5/5 equal and symmetric strength both upper and lower extremities.       Assessment & Plan:   Patient Instructions  Tension Headache Intermittent temporal headache, likely tension-type due to stress and neck muscle pain found on trapezius palpation bilaterally. Normal neurological exam. - Order ESR to rule out other headache types as some bilateral temporal pai. - Prescribe Flexeril 5 mg at night for three nights, cautioning about potential morning drowsiness. Can use tylenol for mild HA. - Advise emergency care for severe headache or  if ever vision changes, numbness, dizziness, or vomiting.  Anxiety Moderate anxiety related to daughter's cancer diagnosis. GAD-7 score 11. Buspar effective previously, but initiation deferred to assess Flexeril's effect. - Prescribe Buspar, initiate after Flexeril use for 3 days - Send Buspar prescription to pharmacy for future use.  Follow up 3-4 weeks or sooner if needed

## 2023-07-09 NOTE — Patient Instructions (Signed)
 Tension Headache Intermittent temporal headache, likely tension-type due to stress and neck muscle pain found on trapezius palpation bilaterally. Normal neurological exam. - Order ESR to rule out other headache types as some bilateral temporal pai. - Prescribe Flexeril 5 mg at night for three nights, cautioning about potential morning drowsiness. Can use tylenol for mild HA. - Advise emergency care for severe headache or  if ever vision changes, numbness, dizziness, or vomiting.  Anxiety Moderate anxiety related to daughter's cancer diagnosis. GAD-7 score 11. Buspar effective previously, but initiation deferred to assess Flexeril's effect. - Prescribe Buspar, initiate after Flexeril use for 3 days - Send Buspar prescription to pharmacy for future use.  Follow up 3-4 weeks or sooner if needed

## 2023-07-10 ENCOUNTER — Encounter: Payer: Self-pay | Admitting: Medical

## 2023-07-19 ENCOUNTER — Encounter (HOSPITAL_BASED_OUTPATIENT_CLINIC_OR_DEPARTMENT_OTHER): Payer: Self-pay

## 2023-07-19 ENCOUNTER — Other Ambulatory Visit: Payer: Self-pay | Admitting: Medical

## 2023-07-19 ENCOUNTER — Ambulatory Visit (HOSPITAL_BASED_OUTPATIENT_CLINIC_OR_DEPARTMENT_OTHER)
Admission: RE | Admit: 2023-07-19 | Discharge: 2023-07-19 | Disposition: A | Discharge status: Home / Self Care | Payer: Medicare Other | Source: Ambulatory Visit | Attending: Medical | Admitting: Medical

## 2023-07-19 DIAGNOSIS — Z1231 Encounter for screening mammogram for malignant neoplasm of breast: Secondary | ICD-10-CM | POA: Diagnosis not present

## 2023-07-19 DIAGNOSIS — Z139 Encounter for screening, unspecified: Secondary | ICD-10-CM

## 2023-07-29 MED ORDER — ATORVASTATIN CALCIUM 10 MG PO TABS: 10.0000 mg | ORAL_TABLET | Freq: Every day | ORAL | 3 refills | Status: AC

## 2023-07-29 NOTE — Addendum Note (Signed)
 Addended by: Serafina Damme on: 07/29/2023 08:45 PM   Modules accepted: Orders

## 2023-08-01 ENCOUNTER — Ambulatory Visit (AMBULATORY_SURGERY_CENTER): Admitting: *Deleted

## 2023-08-01 VITALS — Ht 62.0 in | Wt 170.0 lb

## 2023-08-01 DIAGNOSIS — Z860100 Personal history of colon polyps, unspecified: Secondary | ICD-10-CM

## 2023-08-01 MED ORDER — NA SULFATE-K SULFATE-MG SULF 17.5-3.13-1.6 GM/177ML PO SOLN
1.0000 | Freq: Once | ORAL | 0 refills | Status: AC
Start: 2023-08-01 — End: 2023-08-01

## 2023-08-01 NOTE — Progress Notes (Signed)
 Pt's name and DOB verified at the beginning of the pre-visit wit 2 identifiers  Permission given to speak with husband present Pt speak fair English with occasional assistance from husband  Pt denies any difficulty with ambulating,sitting, laying down or rolling side to side  Pt has no issues moving head neck or swallowing  No egg or soy allergy known to patient   No issues known to pt with past sedation with any surgeries or procedures  No FH of Malignant Hyperthermia  Pt is not on home 02   Pt is not on blood thinners   Pt denies issues with constipation   Pt has frequent issues with constipation RN instructed pt to use Miralax per bottles instructions a week before prep days. Pt states they will   Pt is not on dialysis  Pt has hx of Bradycardia  Pt denies any upcoming cardiac testing  Patient's chart reviewed by Rogena Class CNRA prior to pre-visit and patient appropriate for the [facility].  Pre-visit completed and red dot placed by patient's name on their procedure day (on provider's schedule).    Visit in person  Pt states weight is 170 lb   IInstructions reviewed. Pt given  both [facility] main # and MD on call # prior to instructions.  Pt states understanding of instructions. Instructed pt to review instructions again prior to procedure and call main # given if has questions.. Pt states they will.   Instructed pt on where to find instructions on My Chart.  Instructions given to pt with coupon

## 2023-08-07 ENCOUNTER — Encounter: Payer: Self-pay | Admitting: Internal Medicine

## 2023-08-21 ENCOUNTER — Encounter: Payer: Self-pay | Admitting: Internal Medicine

## 2023-08-23 ENCOUNTER — Ambulatory Visit: Admitting: Internal Medicine

## 2023-08-23 ENCOUNTER — Encounter: Payer: Self-pay | Admitting: Internal Medicine

## 2023-08-23 VITALS — BP 113/77 | HR 79 | Temp 98.1°F | Resp 9 | Ht 62.0 in | Wt 170.0 lb

## 2023-08-23 DIAGNOSIS — K5730 Diverticulosis of large intestine without perforation or abscess without bleeding: Secondary | ICD-10-CM | POA: Diagnosis not present

## 2023-08-23 DIAGNOSIS — Z860100 Personal history of colon polyps, unspecified: Secondary | ICD-10-CM

## 2023-08-23 DIAGNOSIS — Z1211 Encounter for screening for malignant neoplasm of colon: Secondary | ICD-10-CM

## 2023-08-23 DIAGNOSIS — K5520 Angiodysplasia of colon without hemorrhage: Secondary | ICD-10-CM | POA: Diagnosis not present

## 2023-08-23 DIAGNOSIS — D123 Benign neoplasm of transverse colon: Secondary | ICD-10-CM

## 2023-08-23 DIAGNOSIS — Z860101 Personal history of adenomatous and serrated colon polyps: Secondary | ICD-10-CM | POA: Diagnosis not present

## 2023-08-23 DIAGNOSIS — K648 Other hemorrhoids: Secondary | ICD-10-CM

## 2023-08-23 MED ORDER — SODIUM CHLORIDE 0.9 % IV SOLN: 500.0000 mL | Freq: Once | INTRAVENOUS | Status: DC

## 2023-08-23 NOTE — Progress Notes (Signed)
 Report given to PACU, vss

## 2023-08-23 NOTE — Progress Notes (Signed)
 Called to room to assist during endoscopic procedure.  Patient ID and intended procedure confirmed with present staff. Received instructions for my participation in the procedure from the performing physician.

## 2023-08-23 NOTE — Progress Notes (Signed)
 Pt's states no medical or surgical changes since previsit or office visit.

## 2023-08-23 NOTE — Op Note (Signed)
 [facility] Patient Name: Janet Peters Procedure Date: 08/23/2023 9:25 AM MRN: [id number] Endoscopist: Nannette Babe , MD, [id number] Age: 73 Referring MD:  Date of Birth: 1950-04-05 Gender: Female Account #: [id number] Procedure:                Colonoscopy Indications:              High risk colon cancer surveillance: Personal                            history of multiple (3 or more) adenomas, Last                            colonoscopy: January 2022 Medicines:                Monitored Anesthesia Care Procedure:                Pre-Anesthesia Assessment:                           - Prior to the procedure, a History and Physical                            was performed, and patient medications and                            allergies were reviewed. The patient's tolerance of                            previous anesthesia was also reviewed. The risks                            and benefits of the procedure and the sedation                            options and risks were discussed with the patient.                            All questions were answered, and informed consent                            was obtained. Prior Anticoagulants: The patient has                            taken no anticoagulant or antiplatelet agents. ASA                            Grade Assessment: II - A patient with mild systemic                            disease. After reviewing the risks and benefits,                            the patient was deemed in satisfactory condition to  undergo the procedure.                           After obtaining informed consent, the colonoscope                            was passed under direct vision. Throughout the                            procedure, the patient's blood pressure, pulse, and                            oxygen saturations were monitored continuously. The                            CF [id number] #[id number] was  introduced through the anus                            and advanced to the cecum, identified by                            appendiceal orifice and ileocecal valve. The                            colonoscopy was performed without difficulty. The                            patient tolerated the procedure well. The quality                            of the bowel preparation was good. The ileocecal                            valve, appendiceal orifice, and rectum were                            photographed. Scope In: 9:31:33 AM Scope Out: 9:44:29 AM Scope Withdrawal Time: 0 hours 10 minutes 2 seconds  Total Procedure Duration: 0 hours 12 minutes 56 seconds  Findings:                 The digital rectal exam was normal.                           A single small angioectasia with typical                            arborization was found in the cecum.                           A 3 mm polyp was found in the transverse colon. The                            polyp was sessile. The polyp was removed with a  cold snare. Resection and retrieval were complete.                           Multiple medium-mouthed and small-mouthed                            diverticula were found in the sigmoid colon and                            descending colon.                           Internal hemorrhoids were found during                            retroflexion. The hemorrhoids were medium-sized. Complications:            No immediate complications. Estimated Blood Loss:     Estimated blood loss: none. Impression:               - A single colonic angioectasia.                           - One 3 mm polyp in the transverse colon, removed                            with a cold snare. Resected and retrieved.                           - Moderate diverticulosis in the sigmoid colon and                            in the descending colon.                           - Internal  hemorrhoids. Recommendation:           - Patient has a contact number available for                            emergencies. The signs and symptoms of potential                            delayed complications were discussed with the                            patient. Return to normal activities tomorrow.                            Written discharge instructions were provided to the                            patient.                           - Resume previous diet.                           -  Continue present medications.                           - Await pathology results.                           - Repeat colonoscopy is recommended for                            surveillance. The colonoscopy date will be                            determined after pathology results from today's                            exam become available for review. Nannette Babe, MD 08/23/2023 9:47:12 AM This report has been signed electronically.

## 2023-08-23 NOTE — Patient Instructions (Signed)
 USTED TUVO UN PROCEDIMIENTO ENDOSCPICO HOY EN EL [facility]:   Lea el informe del procedimiento que se le entreg para cualquier pregunta especfica sobre lo que se Dentist.  Si el informe del examen no responde a sus preguntas, por favor llame a su gastroenterlogo para aclararlo.  Si usted solicit que no se le den [employer] de lo que se Clinical cytogeneticist en su procedimiento al [employer] va a cuidar, entonces el informe del procedimiento se ha incluido en un sobre sellado para que usted lo revise despus cuando le sea ms conveniente.   LO QUE PUEDE ESPERAR: Algunas sensaciones de hinchazn en el abdomen.  Puede tener ms gases de lo normal.  El caminar puede ayudarle a eliminar el aire que se le puso en el tracto gastrointestinal durante el procedimiento y reducir la hinchazn.  Si le hicieron una endoscopia inferior (como una colonoscopia o una sigmoidoscopia flexible), podra notar manchas de sangre en las heces fecales o en el papel higinico.  Si se someti a una preparacin intestinal para su procedimiento, es posible que no tenga una evacuacin intestinal normal durante [employer].   Tenga en cuenta:  Es posible que note un poco de irritacin y congestin en la nariz o algn drenaje.  Esto es debido al oxgeno [employer] durante su procedimiento.  No hay que preocuparse y esto debe desaparecer ms o Regulatory affairs officer.   SNTOMAS PARA REPORTAR INMEDIATAMENTE:  Despus de una endoscopia inferior (colonoscopia o sigmoidoscopia flexible):  Cantidades excesivas de sangre en las heces fecales  Sensibilidad significativa o empeoramiento de los dolores abdominales   Hinchazn aguda del abdomen que antes no tena   Fiebre de 100F o ms   Para asuntos urgentes o de Associate Professor, puede comunicarse con un gastroenterlogo a cualquier hora llamando al [phone].  DIETA:  Recomendamos una comida pequea al principio, pero luego puede continuar con su dieta normal.  Tome  muchos lquidos, Tax adviser las bebidas alcohlicas durante 24 horas.    ACTIVIDAD:  Debe planear tomarse las cosas con calma por el resto del da y no debe CONDUCIR ni usar maquinaria pesada Patent examiner (debido a los medicamentos de sedacin utilizados durante el examen).     SEGUIMIENTO: Nuestro personal llamar al nmero que aparece en su historial al siguiente da hbil de su procedimiento para ver cmo se siente y para responder cualquier pregunta o inquietud que pueda tener con respecto a la informacin que se le dio despus del procedimiento. Si no podemos contactarle, le dejaremos un mensaje.  Sin embargo, si se siente bien y no tiene English as a second language teacher, no es necesario que nos devuelva la llamada.  Asumiremos que ha regresado a sus actividades diarias normales sin incidentes. Si se le tomaron algunas biopsias, le contactaremos por telfono o por carta en las prximas 3 semanas.  Si no ha sabido [employer] biopsias en el transcurso de 3 semanas, por favor llmenos al [phone].   FIRMAS/CONFIDENCIALIDAD: Usted y/o el acompaante que le cuide han firmado documentos que se ingresarn en su historial mdico electrnico.  Estas firmas atestiguan el hecho de que la informacin anterior

## 2023-08-23 NOTE — Progress Notes (Signed)
 GASTROENTEROLOGY PROCEDURE H&P NOTE   Primary Care Physician: Sylvia Everts, PA-C    Reason for Procedure:  History of adenomatous polyps  Plan:    Colonoscopy  Patient is appropriate for endoscopic procedure(s) in the ambulatory ([facility]) setting.  The nature of the procedure, as well as the risks, benefits, and alternatives were carefully and thoroughly reviewed with the patient. Ample time for discussion and questions allowed. The patient understood, was satisfied, and agreed to proceed.     HPI: Cystal Cree Napoli is a 73 y.o. female who presents for colonoscopy.  Medical history as below.  Tolerated the prep.  No recent chest pain or shortness of breath.  No abdominal pain today.  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   Bradycardia    per pt report   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 04/27/2022   Chest discomfort 11/19/2019   Diabetes mellitus due to underlying condition with unspecified complications (HCC) 01/01/2018   Hyperlipidemia    on meds   Hypertension    Obesity (BMI 30.0-34.9) 07/04/2021   Osteopenia 10/2016   T score -1.3 FRAX 3.5%/0.1%   Rash and other nonspecific skin eruption 06/28/2021   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    Past Surgical History:  Procedure Laterality Date   CATARACT EXTRACTION Left    CESAREAN SECTION     x2   CHOLECYSTECTOMY, LAPAROSCOPIC  2010   COLONOSCOPY  03/2020   KNEE ARTHROSCOPY W/ MENISCAL REPAIR Left 2011   PARTIAL HYSTERECTOMY  1988    Prior to Admission medications   Medication Sig Start Date End Date Taking? Authorizing Provider  ACCU-CHEK GUIDE test strip USE TO TEST BLOOD GLUCOSE FOUR TIMES DAILY AS DIRECTED 09/18/22   Yes Saguier, Gaylin Ke, PA-C  atorvastatin  (LIPITOR) 10 MG tablet Take 1 tablet (10 mg total) by mouth daily. 07/29/23  Yes Saguier, Gaylin Ke, PA-C  Barberry-Oreg Grape-Goldenseal (BERBERINE COMPLEX PO) Take by mouth.   Yes [provider]  fluticasone  (FLONASE ) 50 MCG/ACT nasal spray Place 2 sprays into both nostrils daily. 07/19/23  Yes Saguier, Gaylin Ke, PA-C  JANUVIA  25 MG tablet TAKE 1 TABLET(25 MG) BY MOUTH DAILY 06/05/23  Yes Saguier, Gaylin Ke, PA-C  losartan  (COZAAR ) 50 MG tablet Take 1 tablet (50 mg total) by mouth daily. TAKE 1 TABLET(50 MG) BY MOUTH DAILY 09/08/22  Yes Saguier, Gaylin Ke, PA-C  metFORMIN  (GLUCOPHAGE ) 500 MG tablet TAKE 2 TABLETS BY MOUTH EVERY MORNING AND 1 IN PM 05/31/23  Yes Saguier, Gaylin Ke, PA-C  Cholecalciferol (VITAMIN D3) 125 MCG (5000 UT) CAPS Take 5,000 Units by mouth daily.    [provider]  fenofibrate  (TRICOR ) 48 MG tablet Take 1 tablet (48 mg total) by mouth daily. 09/08/22   Saguier, Gaylin Ke, PA-C  MAGNESIUM PO Take 1 tablet by mouth daily. Patient not taking: Reported on 08/01/2023    [provider]    Current Outpatient Medications  Medication Sig Dispense Refill   ACCU-CHEK GUIDE test strip USE TO TEST BLOOD GLUCOSE FOUR TIMES DAILY AS DIRECTED 200 strip 12   atorvastatin  (LIPITOR) 10 MG tablet Take 1 tablet (10 mg total) by mouth daily. 90 tablet 3   Barberry-Oreg Grape-Goldenseal (BERBERINE COMPLEX PO) Take by mouth.  fluticasone  (FLONASE ) 50 MCG/ACT nasal spray Place 2 sprays into both nostrils daily. 48 g 1   JANUVIA  25 MG tablet TAKE 1 TABLET(25 MG) BY MOUTH DAILY 30 tablet 3   losartan  (COZAAR ) 50 MG tablet Take 1 tablet (50 mg total) by mouth daily. TAKE 1 TABLET(50 MG) BY MOUTH DAILY 90 tablet 3   metFORMIN  (GLUCOPHAGE ) 500 MG tablet TAKE 2 TABLETS BY MOUTH EVERY MORNING AND 1 IN PM 270 tablet 1   Cholecalciferol (VITAMIN D3) 125 MCG (5000 UT) CAPS Take 5,000 Units by mouth daily.     fenofibrate  (TRICOR ) 48 MG tablet Take 1 tablet  (48 mg total) by mouth daily. 90 tablet 3   MAGNESIUM PO Take 1 tablet by mouth daily. (Patient not taking: Reported on 08/01/2023)     Current Facility-Administered Medications  Medication Dose Route Frequency Provider Last Rate Last Admin   0.9 %  sodium chloride  infusion  500 mL Intravenous Once Caedyn Tassinari, Amber Bail, MD        Allergies as of 08/23/2023   (No Known Allergies)    Family History  Problem Relation Age of Onset   Heart attack Maternal Grandmother    Colon polyps Neg Hx    Colon cancer Neg Hx    Esophageal cancer Neg Hx    Stomach cancer Neg Hx    Rectal cancer Neg Hx     Social History   Socioeconomic History   Marital status: Married    Spouse name: Not on file   Number of children: Not on file   Years of education: Not on file   Highest education level: Bachelor's degree (e.g., BA, AB, BS)  Occupational History   Not on file  Tobacco Use   Smoking status: Never   Smokeless tobacco: Never  Vaping Use   Vaping status: Never Used  Substance and Sexual Activity   Alcohol use: No   Drug use: No   Sexual activity: Yes    Partners: Male    Birth control/protection: Surgical    Comment: 1st intercourse- 23, partners - 2, married- 20 yrs, hysterectomy  Other Topics Concern   Not on file  Social History Narrative   Not on file   Social Drivers of Health   Financial Resource Strain: Medium Risk (09/05/2022)   Overall Financial Resource Strain (CARDIA)    Difficulty of Paying Living Expenses: Somewhat hard  Food Insecurity: Food Insecurity Present (05/17/2023)   Hunger Vital Sign    Worried About Running Out of Food in the Last Year: Sometimes true    Ran Out of Food in the Last Year: Sometimes true  Transportation Needs: No Transportation Needs (05/17/2023)   PRAPARE - Administrator, Civil Service (Medical): No    Lack of Transportation (Non-Medical): No  Physical Activity: Sufficiently Active (05/17/2023)   Exercise Vital Sign    Days of Exercise  per Week: 4 days    Minutes of Exercise per Session: 70 min  Stress: Patient Declined (01/09/2023)   [employer] of Occupational Health - Occupational Stress Questionnaire    Feeling of Stress : Patient declined  Social Connections: Moderately Isolated (05/17/2023)   Social Connection and Isolation Panel [NHANES]    Frequency of Communication with Friends and Family: More than three times a week    Frequency of Social Gatherings with Friends and Family: Never    Attends Religious Services: Never    Database administrator or Organizations: No    Attends Banker  Meetings: Not on file    Marital Status: Married  Intimate Partner Violence: Not on file    Physical Exam: Vital signs in last 24 hours: @BP  136/78   Pulse 96   Temp 98.1 F (36.7 C) (Temporal)   Ht 5\' 2"  (1.575 m)   Wt 170 lb (77.1 kg)   SpO2 97%   BMI 31.09 kg/m  GEN: NAD EYE: Sclerae anicteric ENT: MMM CV: Non-tachycardic Pulm: CTA b/l GI: Soft, NT/ND NEURO:  Alert & Oriented x 3   Laurell Pond, MD [facility]  08/23/2023 9:19 AM

## 2023-08-24 ENCOUNTER — Telehealth: Payer: Self-pay | Admitting: *Deleted

## 2023-08-24 NOTE — Telephone Encounter (Signed)
  Follow up Call-     08/23/2023    8:40 AM  Call back number  Post procedure Call Back phone  # [phone]-speaks some english  Permission to leave phone message Yes   Called pt using [employer] interpreter telephone services. ID #[id number]  Patient questions:  Do you have a fever, pain , or abdominal swelling? No. Pain Score  0 *  Have you tolerated food without any problems? Yes.    Have you been able to return to your normal activities? Yes.    Do you have any questions about your discharge instructions: Diet   No. Medications  No. Follow up visit  No.  Do you have questions or concerns about your Care? No.  Actions: * If pain score is 4 or above: No action needed, pain <4.

## 2023-08-27 LAB — SURGICAL PATHOLOGY

## 2023-08-27 NOTE — Progress Notes (Signed)
 Cardiology Office Note   Date:  08/28/2023  ID:  Janet Peters, Janet Peters [birth year], MRN [id number] PCP: Francine Iron  [facility] Providers Cardiologist:  Zoe Hinds, MD     History of Present Illness Janet Peters is a 73 y.o. female with a past medical history of dyslipidemia, DM 2, aortic calcification noted on CT imaging, hypertension, right bundle branch block.  10/04/2022 Vascuscreen bilateral carotid arteries with little or no evidence of plaque, ABIs within normal limits, no evidence of abdominal aortic aneurysm 09/21/2020 echo EF 60-65%, mild concentric LVH, grade 1 DD 08/26/2020 calcium  score of 0, dilated main pulmonary artery suggestive of pulmonary hypertension and scattered calcification of the aortic root 10/12/2017 Lexiscan  EF 70%, normal, low risk 08/28/2016 echo an EF of 60 to 65%, mild LVH, grade 1 DD  [facility] with [facility] in 2018 for evaluation of her right bundle branch block, she was asymptomatic regarding this and had been noted on a routine EKG with her PCP.  An echocardiogram at that time revealing an EF of 60 to 65%, mild LVH, grade 1 DD.  She underwent a Lexiscan  in 2019 which was normal, low risk study.  In 2022 she had a calcium  score of 0 but did indicate possible mild dilatation of her pulmonary artery so repeat echocardiogram was arranged which was overall unchanged.  Most recently she was evaluated by Dr. Sandee Crook on 08/25/2022, she was doing stable, no changes to plan of care and advised to follow up in 1 year.   She presents today accompanied by medical interpreter for follow-up.  She has been doing well since she was last evaluated in our office.  She works out at [employer] at least 4 days a week for typically 70 minutes at a time.  She follows closely with her PCP, lab work completed in February revealed her LDL was 32 which is acceptable as she has no known coronary artery disease. She denies chest pain, palpitations, dyspnea, pnd,  orthopnea, n, v, dizziness, syncope, edema, weight gain, or early satiety.   ROS: Review of Systems  Musculoskeletal:  Positive for joint pain.  All other systems reviewed and are negative.  EKG Interpretation Date/Time:  Tuesday August 28 2023 07:55:55 EDT Ventricular Rate:  84 PR Interval:  154 QRS Duration:  108 QT Interval:  402 QTC Calculation: 475 R Axis:   48  Text Interpretation: Normal sinus rhythm Incomplete right bundle branch block Nonspecific T wave abnormality Prolonged QT No previous ECGs available Confirmed by Pattricia Bores [phone]) on 08/28/2023 8:14:35 AM   Studies Reviewed  Cardiac Studies & Procedures   ______________________________________________________________________________________________   STRESS TESTS  MYOCARDIAL PERFUSION IMAGING 10/12/2017  Narrative  Nuclear stress EF: 70%.  The study is normal.  This is a low risk study.  There was no ST segment deviation noted during stress.  No T wave inversion was noted during stress.  Low risk stress nuclear study with normal perfusion and normal left ventricular regional and global systolic function.   ECHOCARDIOGRAM  ECHOCARDIOGRAM COMPLETE 09/21/2020  Narrative ECHOCARDIOGRAM REPORT    Patient Name:   Janet Peters [facility] Date of Exam: 09/21/2020 Medical Rec #:  [id number]             Height:       62.0 in Accession #:    [id number]            Weight:       172.0 lb Date of Birth:  1950/06/25  BSA:          1.793 m Patient Age:    70 years              BP:           128/65 mmHg Patient Gender: F                     HR:           63 bpm. Exam Location:  [facility]  Procedure: 2D Echo  Indications:    Pulmonary hypertension I27.2  History:        Patient has prior history of Echocardiogram examinations, most recent 08/28/2016. Arrythmias:Bradycardia; Risk Factors:Diabetes.  Sonographer:    Kristen Petri Referring Phys: Pollie Bring [facility]  IMPRESSIONS   1. Left ventricular  ejection fraction, by estimation, is 60 to 65%. The left ventricle has normal function. The left ventricle has no regional wall motion abnormalities. There is mild concentric left ventricular hypertrophy. Left ventricular diastolic parameters are consistent with Grade I diastolic dysfunction (impaired relaxation). The average left ventricular global longitudinal strain is -6.2 %. The global longitudinal strain is abnormal. 2. Right ventricular systolic function is normal. The right ventricular size is normal. 3. The mitral valve is normal in structure. No evidence of mitral valve regurgitation. No evidence of mitral stenosis. 4. The aortic valve is normal in structure. Aortic valve regurgitation is not visualized. No aortic stenosis is present. 5. The inferior vena cava is normal in size with greater than 50% respiratory variability, suggesting right atrial pressure of 3 mmHg.  FINDINGS Left Ventricle: Left ventricular ejection fraction, by estimation, is 60 to 65%. The left ventricle has normal function. The left ventricle has no regional wall motion abnormalities. The average left ventricular global longitudinal strain is -6.2 %. The global longitudinal strain is abnormal. The left ventricular internal cavity size was normal in size. There is mild concentric left ventricular hypertrophy. Left ventricular diastolic parameters are consistent with Grade I diastolic dysfunction (impaired relaxation).  Right Ventricle: The right ventricular size is normal. No increase in right ventricular wall thickness. Right ventricular systolic function is normal.  Left Atrium: Left atrial size was normal in size.  Right Atrium: Right atrial size was normal in size.  Pericardium: There is no evidence of pericardial effusion. Presence of pericardial fat pad.  Mitral Valve: The mitral valve is normal in structure. No evidence of mitral valve regurgitation. No evidence of mitral valve stenosis.  Tricuspid Valve:  The tricuspid valve is normal in structure. Tricuspid valve regurgitation is not demonstrated. No evidence of tricuspid stenosis.  Aortic Valve: The aortic valve is normal in structure. Aortic valve regurgitation is not visualized. No aortic stenosis is present.  Pulmonic Valve: The pulmonic valve was normal in structure. Pulmonic valve regurgitation is not visualized. No evidence of pulmonic stenosis.  Aorta: The aortic root is normal in size and structure.  Venous: The inferior vena cava is normal in size with greater than 50% respiratory variability, suggesting right atrial pressure of 3 mmHg.  IAS/Shunts: No atrial level shunt detected by color flow Doppler.   LEFT VENTRICLE PLAX 2D LVIDd:         4.00 cm  Diastology LVIDs:         2.60 cm  LV e' medial:    6.74 cm/s LV PW:         1.10 cm  LV E/e' medial:  8.3 LV IVS:  1.10 cm  LV e' lateral:   9.36 cm/s LVOT diam:     2.00 cm  LV E/e' lateral: 6.0 LV SV:         55 LV SV Index:   31       2D Longitudinal Strain LVOT Area:     3.14 cm 2D Strain GLS Avg:     -6.2 %   RIGHT VENTRICLE            IVC RV S prime:     8.81 cm/s  IVC diam: 1.20 cm TAPSE (M-mode): 1.8 cm  LEFT ATRIUM             Index       RIGHT ATRIUM          Index LA diam:        3.40 cm 1.90 cm/m  RA Area:     6.37 cm LA Vol (A2C):   23.4 ml 13.05 ml/m RA Volume:   8.41 ml  4.69 ml/m LA Vol (A4C):   32.9 ml 18.35 ml/m LA Biplane Vol: 29.6 ml 16.51 ml/m AORTIC VALVE LVOT Vmax:   98.30 cm/s LVOT Vmean:  70.500 cm/s LVOT VTI:    0.176 m  AORTA Ao Root diam: 2.40 cm Ao Asc diam:  2.80 cm Ao Desc diam: 2.00 cm  MITRAL VALVE               TRICUSPID VALVE MV Area (PHT): 6.90 cm    TR Peak grad:   14.9 mmHg MV Decel Time: 110 msec    TR Vmax:        193.00 cm/s MV E velocity: 55.80 cm/s MV A velocity: 97.40 cm/s  SHUNTS MV E/A ratio:  0.57        Systemic VTI:  0.18 m Systemic Diam: 2.00 cm  Kardie Tobb DO Electronically signed by Jerryl Morin DO Signature Date/Time: 09/21/2020/12:37:15 PM    Final      CT SCANS  CT CARDIAC SCORING (SELF PAY ONLY) 08/26/2020  Addendum 08/26/2020  4:06 PM ADDENDUM REPORT: 08/26/2020 16:04  CLINICAL DATA:  Cardiovascular Disease Risk stratification  EXAM: Coronary Calcium  Score  TECHNIQUE: A gated, non-contrast computed tomography scan of the heart was performed using 3mm slice thickness. Axial images were analyzed on a dedicated workstation. Calcium  scoring of the coronary arteries was performed using the Agatston method.  FINDINGS: Coronary arteries: Normal origins.  Coronary Calcium  Score:  Left main: 0  Left anterior descending artery: 0  Left circumflex artery: 0  Right coronary artery: 0  Total: 0  Percentile: 0  Pericardium: Normal.  Ascending Aorta: Normal caliber.  Non-cardiac: See separate report from [facility]. Dilated main pulmonary artery (non-contrast) to 30 mm, suggestive of pulmonary hypertension.  IMPRESSION: 1. No detectable coronary calcium .  This is a low risk study. 2. Dilated main pulmonary artery (non-contrast) to 30 mm, suggestive of pulmonary hypertension. 3. Scattered calcifications of the aortic root.  RECOMMENDATIONS: Coronary artery calcium  (CAC) score is a strong predictor of incident coronary heart disease (CHD) and provides predictive information beyond traditional risk factors. CAC scoring is reasonable to use in the decision to withhold, postpone, or initiate statin therapy in intermediate-risk or selected borderline-risk asymptomatic adults (age 26-75 years and LDL-C >=70 to <190 mg/dL) who do not have diabetes or established atherosclerotic cardiovascular disease (ASCVD).* In intermediate-risk (10-year ASCVD risk >=7.5% to <20%) adults or selected borderline-risk (10-year ASCVD risk >=5% to <7.5%) adults in whom a CAC score is  measured for the purpose of making a treatment decision the  following recommendations have been made:  If CAC=0, it is reasonable to withhold statin therapy and reassess in 5 to 10 years, as long as higher risk conditions are absent (diabetes mellitus, family history of premature CHD in first degree relatives (males <55 years; females <65 years), cigarette smoking, or LDL >=190 mg/dL).  If CAC is 1 to 99, it is reasonable to initiate statin therapy for patients >=73 years of age.  If CAC is >=100 or >=75th percentile, it is reasonable to initiate statin therapy at any age.  Cardiology referral should be considered for patients with CAC scores >=400 or >=75th percentile.  *2018 AHA/ACC/AACVPR/AAPA/ABC/ACPM/ADA/AGS/APhA/ASPC/NLA/PCNA Guideline on the Management of Blood Cholesterol: A Report of the American College of Cardiology/American Heart Association Task Force on Clinical Practice Guidelines. J Am Coll Cardiol. 2019;73(24):3168-3209.  Dinah Franco, MD   Electronically Signed By: Hazle Lites M.D. On: 08/26/2020 16:04  Narrative EXAM: OVER-READ INTERPRETATION  CT CHEST  The following report is an over-read performed by radiologist Dr. Janeece Mechanic of [facility] on 08/26/2020. This over-read does not include interpretation of cardiac or coronary anatomy or pathology. The coronary calcium  score interpretation by the cardiologist is attached.  COMPARISON:  None.  FINDINGS: Vascular: Heart is normal size. Aorta normal caliber. Scattered calcifications in the aortic root.  Mediastinum/Nodes: No adenopathy  Lungs/Pleura: No confluent opacities or effusions.  Upper Abdomen: Imaging into the upper abdomen demonstrates no acute findings.  Musculoskeletal: Chest wall soft tissues are unremarkable. No acute bony abnormality.  IMPRESSION: No acute extra cardiac abnormality.  Scattered punctate calcifications in the aortic root.  Electronically Signed: By: Janeece Mechanic M.D. On: 08/26/2020 15:08      ______________________________________________________________________________________________      EKG Interpretation Date/Time:  Tuesday August 28 2023 07:55:55 EDT Ventricular Rate:  84 PR Interval:  154 QRS Duration:  108 QT Interval:  402 QTC Calculation: 475 R Axis:   48  Text Interpretation: Normal sinus rhythm Incomplete right bundle branch block Nonspecific T wave abnormality Prolonged QT No previous ECGs available Confirmed by Pattricia Bores [phone]) on 08/28/2023 8:14:35 AM    Cardiac Studies & Procedures   ______________________________________________________________________________________________   STRESS TESTS  MYOCARDIAL PERFUSION IMAGING 10/12/2017  Narrative  Nuclear stress EF: 70%.  The study is normal.  This is a low risk study.  There was no ST segment deviation noted during stress.  No T wave inversion was noted during stress.  Low risk stress nuclear study with normal perfusion and normal left ventricular regional and global systolic function.   ECHOCARDIOGRAM  ECHOCARDIOGRAM COMPLETE 09/21/2020  Narrative ECHOCARDIOGRAM REPORT    Patient Name:   NEKEYA BRISKI [facility] Date of Exam: 09/21/2020 Medical Rec #:  [id number]             Height:       62.0 in Accession #:    [id number]            Weight:       172.0 lb Date of Birth:  10/23/1950             BSA:          1.793 m Patient Age:    70 years              BP:           128/65 mmHg Patient Gender: F  HR:           63 bpm. Exam Location:  [place]  Procedure: 2D Echo  Indications:    Pulmonary hypertension I27.2  History:        Patient has prior history of Echocardiogram examinations, most recent 08/28/2016. Arrythmias:Bradycardia; Risk Factors:Diabetes.  Sonographer:    Kristen Petri Referring Phys: Pollie Bring [facility]  IMPRESSIONS   1. Left ventricular ejection fraction, by estimation, is 60 to 65%. The left ventricle has normal function. The left ventricle  has no regional wall motion abnormalities. There is mild concentric left ventricular hypertrophy. Left ventricular diastolic parameters are consistent with Grade I diastolic dysfunction (impaired relaxation). The average left ventricular global longitudinal strain is -6.2 %. The global longitudinal strain is abnormal. 2. Right ventricular systolic function is normal. The right ventricular size is normal. 3. The mitral valve is normal in structure. No evidence of mitral valve regurgitation. No evidence of mitral stenosis. 4. The aortic valve is normal in structure. Aortic valve regurgitation is not visualized. No aortic stenosis is present. 5. The inferior vena cava is normal in size with greater than 50% respiratory variability, suggesting right atrial pressure of 3 mmHg.  FINDINGS Left Ventricle: Left ventricular ejection fraction, by estimation, is 60 to 65%. The left ventricle has normal function. The left ventricle has no regional wall motion abnormalities. The average left ventricular global longitudinal strain is -6.2 %. The global longitudinal strain is abnormal. The left ventricular internal cavity size was normal in size. There is mild concentric left ventricular hypertrophy. Left ventricular diastolic parameters are consistent with Grade I diastolic dysfunction (impaired relaxation).  Right Ventricle: The right ventricular size is normal. No increase in right ventricular wall thickness. Right ventricular systolic function is normal.  Left Atrium: Left atrial size was normal in size.  Right Atrium: Right atrial size was normal in size.  Pericardium: There is no evidence of pericardial effusion. Presence of pericardial fat pad.  Mitral Valve: The mitral valve is normal in structure. No evidence of mitral valve regurgitation. No evidence of mitral valve stenosis.  Tricuspid Valve: The tricuspid valve is normal in structure. Tricuspid valve regurgitation is not demonstrated. No evidence  of tricuspid stenosis.  Aortic Valve: The aortic valve is normal in structure. Aortic valve regurgitation is not visualized. No aortic stenosis is present.  Pulmonic Valve: The pulmonic valve was normal in structure. Pulmonic valve regurgitation is not visualized. No evidence of pulmonic stenosis.  Aorta: The aortic root is normal in size and structure.  Venous: The inferior vena cava is normal in size with greater than 50% respiratory variability, suggesting right atrial pressure of 3 mmHg.  IAS/Shunts: No atrial level shunt detected by color flow Doppler.   LEFT VENTRICLE PLAX 2D LVIDd:         4.00 cm  Diastology LVIDs:         2.60 cm  LV e' medial:    6.74 cm/s LV PW:         1.10 cm  LV E/e' medial:  8.3 LV IVS:        1.10 cm  LV e' lateral:   9.36 cm/s LVOT diam:     2.00 cm  LV E/e' lateral: 6.0 LV SV:         55 LV SV Index:   31       2D Longitudinal Strain LVOT Area:     3.14 cm 2D Strain GLS Avg:     -6.2 %  RIGHT VENTRICLE            IVC RV S prime:     8.81 cm/s  IVC diam: 1.20 cm TAPSE (M-mode): 1.8 cm  LEFT ATRIUM             Index       RIGHT ATRIUM          Index LA diam:        3.40 cm 1.90 cm/m  RA Area:     6.37 cm LA Vol (A2C):   23.4 ml 13.05 ml/m RA Volume:   8.41 ml  4.69 ml/m LA Vol (A4C):   32.9 ml 18.35 ml/m LA Biplane Vol: 29.6 ml 16.51 ml/m AORTIC VALVE LVOT Vmax:   98.30 cm/s LVOT Vmean:  70.500 cm/s LVOT VTI:    0.176 m  AORTA Ao Root diam: 2.40 cm Ao Asc diam:  2.80 cm Ao Desc diam: 2.00 cm  MITRAL VALVE               TRICUSPID VALVE MV Area (PHT): 6.90 cm    TR Peak grad:   14.9 mmHg MV Decel Time: 110 msec    TR Vmax:        193.00 cm/s MV E velocity: 55.80 cm/s MV A velocity: 97.40 cm/s  SHUNTS MV E/A ratio:  0.57        Systemic VTI:  0.18 m Systemic Diam: 2.00 cm  Kardie Tobb DO Electronically signed by Jerryl Morin DO Signature Date/Time: 09/21/2020/12:37:15 PM    Final      CT SCANS  CT CARDIAC SCORING  (SELF PAY ONLY) 08/26/2020  Addendum 08/26/2020  4:06 PM ADDENDUM REPORT: 08/26/2020 16:04  CLINICAL DATA:  Cardiovascular Disease Risk stratification  EXAM: Coronary Calcium  Score  TECHNIQUE: A gated, non-contrast computed tomography scan of the heart was performed using 3mm slice thickness. Axial images were analyzed on a dedicated workstation. Calcium  scoring of the coronary arteries was performed using the Agatston method.  FINDINGS: Coronary arteries: Normal origins.  Coronary Calcium  Score:  Left main: 0  Left anterior descending artery: 0  Left circumflex artery: 0  Right coronary artery: 0  Total: 0  Percentile: 0  Pericardium: Normal.  Ascending Aorta: Normal caliber.  Non-cardiac: See separate report from [facility]. Dilated main pulmonary artery (non-contrast) to 30 mm, suggestive of pulmonary hypertension.  IMPRESSION: 1. No detectable coronary calcium .  This is a low risk study. 2. Dilated main pulmonary artery (non-contrast) to 30 mm, suggestive of pulmonary hypertension. 3. Scattered calcifications of the aortic root.  RECOMMENDATIONS: Coronary artery calcium  (CAC) score is a strong predictor of incident coronary heart disease (CHD) and provides predictive information beyond traditional risk factors. CAC scoring is reasonable to use in the decision to withhold, postpone, or initiate statin therapy in intermediate-risk or selected borderline-risk asymptomatic adults (age 33-75 years and LDL-C >=70 to <190 mg/dL) who do not have diabetes or established atherosclerotic cardiovascular disease (ASCVD).* In intermediate-risk (10-year ASCVD risk >=7.5% to <20%) adults or selected borderline-risk (10-year ASCVD risk >=5% to <7.5%) adults in whom a CAC score is measured for the purpose of making a treatment decision the following recommendations have been made:  If CAC=0, it is reasonable to withhold statin therapy and reassess in 5 to 10  years, as long as higher risk conditions are absent (diabetes mellitus, family history of premature CHD in first degree relatives (males <55 years; females <65 years), cigarette smoking, or LDL >=190 mg/dL).  If CAC is 1  to 34, it is reasonable to initiate statin therapy for patients >=25 years of age.  If CAC is >=100 or >=75th percentile, it is reasonable to initiate statin therapy at any age.  Cardiology referral should be considered for patients with CAC scores >=400 or >=75th percentile.  *2018 AHA/ACC/AACVPR/AAPA/ABC/ACPM/ADA/AGS/APhA/ASPC/NLA/PCNA Guideline on the Management of Blood Cholesterol: A Report of the American College of Cardiology/American Heart Association Task Force on Clinical Practice Guidelines. J Am Coll Cardiol. 2019;73(24):3168-3209.  Dinah Franco, MD   Electronically Signed By: Hazle Lites M.D. On: 08/26/2020 16:04  Narrative EXAM: OVER-READ INTERPRETATION  CT CHEST  The following report is an over-read performed by radiologist Dr. Janeece Mechanic of [facility] on 08/26/2020. This over-read does not include interpretation of cardiac or coronary anatomy or pathology. The coronary calcium  score interpretation by the cardiologist is attached.  COMPARISON:  None.  FINDINGS: Vascular: Heart is normal size. Aorta normal caliber. Scattered calcifications in the aortic root.  Mediastinum/Nodes: No adenopathy  Lungs/Pleura: No confluent opacities or effusions.  Upper Abdomen: Imaging into the upper abdomen demonstrates no acute findings.  Musculoskeletal: Chest wall soft tissues are unremarkable. No acute bony abnormality.  IMPRESSION: No acute extra cardiac abnormality.  Scattered punctate calcifications in the aortic root.  Electronically Signed: By: Janeece Mechanic M.D. On: 08/26/2020 15:08     ______________________________________________________________________________________________      Risk  Assessment/Calculations           Physical Exam VS:  BP 130/70   Pulse 84   Ht 5\' 2"  (1.575 m)   Wt 177 lb (80.3 kg)   SpO2 98%   BMI 32.37 kg/m    Wt Readings from Last 3 Encounters:  08/28/23 177 lb (80.3 kg)  08/23/23 170 lb (77.1 kg)  08/01/23 170 lb (77.1 kg)    GEN: Well nourished, well developed in no acute distress NECK: No JVD; No carotid bruits CARDIAC: RRR, no murmurs, rubs, gallops RESPIRATORY:  Clear to auscultation without rales, wheezing or rhonchi  ABDOMEN: Soft, non-tender, non-distended EXTREMITIES:  No edema; No deformity   ASSESSMENT AND PLAN Aortic calcification noted on CT imaging - Stable with no anginal symptoms. No indication for ischemic evaluation.  Continue Lipitor 10 mg each evening, continue fenofibrate  40 mg each evening.  Right bundle branch block-dating back to 2018, unchanged.  Dyslipidemia-followed by her PCP, most recent LDL was 97, continue Lipitor 10 mg each evening, continue Tricor  48 mg each evening.  Hypertension-blood pressure is 130/70, continue losartan  50 mg daily.  DM2-currently on Januvia , most recent A1c was well-controlled at 6.6%.       Dispo: 1 year with Dr. Sandee Crook.  Signed, Terrance Ferretti, NP

## 2023-08-28 ENCOUNTER — Other Ambulatory Visit: Payer: Self-pay | Admitting: Medical

## 2023-08-28 ENCOUNTER — Encounter: Payer: Self-pay | Admitting: Cardiology

## 2023-08-28 ENCOUNTER — Ambulatory Visit: Attending: Cardiology | Admitting: Cardiology

## 2023-08-28 VITALS — BP 130/70 | HR 84 | Ht 62.0 in | Wt 177.0 lb

## 2023-08-28 DIAGNOSIS — E088 Diabetes mellitus due to underlying condition with unspecified complications: Secondary | ICD-10-CM

## 2023-08-28 DIAGNOSIS — I700 Atherosclerosis of aorta: Secondary | ICD-10-CM | POA: Diagnosis not present

## 2023-08-28 DIAGNOSIS — I10 Essential (primary) hypertension: Secondary | ICD-10-CM | POA: Diagnosis not present

## 2023-08-28 DIAGNOSIS — I4510 Unspecified right bundle-branch block: Secondary | ICD-10-CM

## 2023-08-28 DIAGNOSIS — E782 Mixed hyperlipidemia: Secondary | ICD-10-CM

## 2023-08-28 NOTE — Patient Instructions (Signed)
 Medication Instructions:  Your physician recommends that you continue on your current medications as directed. Please refer to the Current Medication list given to you today.  *If you need a refill on your cardiac medications before your next appointment, please call your pharmacy*  Lab Work: NONE If you have labs (blood work) drawn today and your tests are completely normal, you will receive your results only by: MyChart Message (if you have MyChart) OR A paper copy in the mail If you have any lab test that is abnormal or we need to change your treatment, we will call you to review the results.  Testing/Procedures: NONE  Follow-Up: At [facility], you and your health needs are our priority.  As part of our continuing mission to provide you with exceptional heart care, our providers are all part of one team.  This team includes your primary Cardiologist (physician) and Advanced Practice Providers or APPs (Physician Assistants and Nurse Practitioners) who all work together to provide you with the care you need, when you need it.  Your next appointment:   1 year(s)  Provider:   Norman Herrlich, MD    We recommend signing up for the patient portal called "MyChart".  Sign up information is provided on this After Visit Summary.  MyChart is used to connect with patients for Virtual Visits (Telemedicine).  Patients are able to view lab/test results, encounter notes, upcoming appointments, etc.  Non-urgent messages can be sent to your provider as well.   To learn more about what you can do with MyChart, go to [email or web address].   Other Instructions

## 2023-08-29 ENCOUNTER — Ambulatory Visit: Payer: Self-pay | Admitting: Internal Medicine

## 2023-09-05 ENCOUNTER — Other Ambulatory Visit: Payer: Self-pay | Admitting: Medical

## 2023-09-12 ENCOUNTER — Ambulatory Visit: Admitting: Orthopedic Surgery

## 2023-09-12 ENCOUNTER — Other Ambulatory Visit (INDEPENDENT_AMBULATORY_CARE_PROVIDER_SITE_OTHER)

## 2023-09-12 DIAGNOSIS — M545 Low back pain, unspecified: Secondary | ICD-10-CM

## 2023-09-12 MED ORDER — PREGABALIN 75 MG PO CAPS: 75.0000 mg | ORAL_CAPSULE | Freq: Two times a day (BID) | ORAL | 1 refills | Status: DC

## 2023-09-12 NOTE — Progress Notes (Signed)
 Orthopedic Spine Surgery Office Note   Assessment: Patient is a 73 y.o. female with right sided lower back pain. No radicular symptoms.  Possible etiologies are L5/S1 facet or SI joint     Plan: -Patient has previously tried toradol , mobic , PT, gabapentin , pregabalin , hydrocodone  -Has found pregabalin  helpful in the past.  New prescription provided today -Provided her with a home exercise program to work on -Talked about injections as a next treatment option, but she is not interested at this time -Lidocaine  patches have not been helpful of late, so she should discontinue using nose -Patient should return to office on an as-needed basis     Patient expressed understanding of the plan and all questions were answered to the patient's satisfaction.    ___________________________________________________________________________     History:   Patient is a 73 y.o. female who presents today for lower back pain.  She has noticed return of her right sided lower back pain.  She has no pain radiating into either lower extremity.  She notices the pain is worse if she is active.  She said physically hurts when she is going up stairs.  She feels it in the lower lumbar region on the right side.  There is no trauma or injury that caused the return of this pain.  She has found Lyrica  helpful in the past. No bowel or bladder incontinence. No saddle anesthesia.    Treatments tried: toradol , mobic , PT, gabapentin , hydrocodone , lyrica      Physical Exam:   General: no acute distress, appears stated age Neurologic: alert, answering questions appropriately, following commands Respiratory: unlabored breathing on room air, symmetric chest rise Psychiatric: appropriate affect, normal cadence to speech     MSK (spine):   -Strength exam                                                   Left                  Right EHL                              5/5                  5/5 TA                                  5/5                  5/5 GSC                             5/5                  5/5 Knee extension            5/5                  5/5 Hip flexion                    5/5                  5/5   -Sensory exam  Sensation intact to light touch in L3-S1 nerve distributions of bilateral lower extremities   -Straight leg raise: negative bilaterally -Clonus: no beats bilaterally   Imaging: XRs of the lumbar spine from 09/12/2023 were independently reviewed and interpreted, showing disc height loss at L5/S1.  Facet arthropathy at L5/S1.  Degenerative changes within the right SI joint.  No other significant degenerative changes seen.  No fracture or dislocation seen.  No evidence of instability on flexion/extension views.     Patient name: Janet Peters Patient MRN: [id number] Date of visit: 09/12/23

## 2023-09-13 ENCOUNTER — Ambulatory Visit: Payer: Self-pay | Admitting: Medical

## 2023-09-13 ENCOUNTER — Ambulatory Visit: Admitting: Medical

## 2023-09-13 VITALS — BP 128/64 | HR 84 | Temp 98.0°F | Resp 16 | Ht 62.0 in | Wt 176.0 lb

## 2023-09-13 DIAGNOSIS — Z794 Long term (current) use of insulin: Secondary | ICD-10-CM | POA: Diagnosis not present

## 2023-09-13 DIAGNOSIS — E559 Vitamin D deficiency, unspecified: Secondary | ICD-10-CM

## 2023-09-13 DIAGNOSIS — D126 Benign neoplasm of colon, unspecified: Secondary | ICD-10-CM | POA: Diagnosis not present

## 2023-09-13 DIAGNOSIS — E118 Type 2 diabetes mellitus with unspecified complications: Secondary | ICD-10-CM

## 2023-09-13 DIAGNOSIS — E7849 Other hyperlipidemia: Secondary | ICD-10-CM | POA: Diagnosis not present

## 2023-09-13 LAB — COMPREHENSIVE METABOLIC PANEL WITH GFR
ALT: 18 U/L (ref 0–35)
AST: 19 U/L (ref 0–37)
Albumin: 4.4 g/dL (ref 3.5–5.2)
Alkaline Phosphatase: 88 U/L (ref 39–117)
BUN: 14 mg/dL (ref 6–23)
CO2: 27 meq/L (ref 19–32)
Calcium: 9.4 mg/dL (ref 8.4–10.5)
Chloride: 106 meq/L (ref 96–112)
Creatinine, Ser: 0.66 mg/dL (ref 0.40–1.20)
GFR: 87.22 mL/min (ref >60.00)
Glucose, Bld: 100 mg/dL — ABNORMAL HIGH (ref 70–99)
Potassium: 4.2 meq/L (ref 3.5–5.1)
Sodium: 140 meq/L (ref 135–145)
Total Bilirubin: 0.4 mg/dL (ref 0.2–1.2)
Total Protein: 7.3 g/dL (ref 6.0–8.3)

## 2023-09-13 LAB — HEMOGLOBIN A1C: Hgb A1c MFr Bld: 6.5 % (ref 4.6–6.5)

## 2023-09-13 MED ORDER — VITAMIN D (ERGOCALCIFEROL) 1.25 MG (50000 UNIT) PO CAPS
50000.0000 [IU] | ORAL_CAPSULE | ORAL | 0 refills | Status: DC
Start: 2023-09-13 — End: 2024-01-18

## 2023-09-13 MED ORDER — SITAGLIPTIN PHOSPHATE 25 MG PO TABS: ORAL_TABLET | ORAL | 3 refills | Status: AC

## 2023-09-13 NOTE — Addendum Note (Signed)
 Addended by: Serafina Damme on: 09/13/2023 10:09 PM   Modules accepted: Orders

## 2023-09-13 NOTE — Progress Notes (Signed)
 Subjective:    Patient ID: Janet Peters, female    DOB: 05/18/1950, 73 y.o.   MRN: [id number]  HPI  Janet Peters is a 73 year old female who presents for a follow-up regarding her colonoscopy results and diabetes management.  The recent colonoscopy revealed a benign polyp, and she is concerned about the potential for it to become cancerous.  She manages her diabetes with Januvia , 25 mg daily, which she finds more effective than metformin . She discontinued metformin  in March, with 270 pills and one refill remaining, due to perceived ineffectiveness. No gastrointestinal side effects from metformin , such as stomach pain or diarrhea, were noted.  Recent blood tests indicated normal kidney function and slightly elevated triglycerides. She takes fenofibrate  for triglyceride management and atorvastatin . Vitamin D  supplementation is at 5000 IU daily with vitamin K2, but her vitamin D  levels remain difficult to increase, with the last level at 31, the lower end of normal.    Review of Systems  Constitutional:  Negative for chills, fatigue and fever.  HENT:  Negative for congestion and ear pain.   Respiratory:  Negative for cough, chest tightness and wheezing.   Cardiovascular:  Negative for chest pain and palpitations.  Gastrointestinal:  Negative for abdominal pain, constipation and nausea.  Genitourinary:  Negative for dysuria and flank pain.  Musculoskeletal:  Negative for back pain, myalgias and neck pain.  Skin:  Negative for rash.  Neurological:  Negative for dizziness, speech difficulty, weakness and light-headedness.  Hematological:  Negative for adenopathy. Does not bruise/bleed easily.  Psychiatric/Behavioral:  Negative for behavioral problems and decreased concentration. The patient is not nervous/anxious.    Past Medical History:  Diagnosis Date   Age-related nuclear cataract of left eye 04/24/2017   Allergic rhinitis due to pollen 02/23/2021   Allergy     seasonal allergies   Aortic calcification (HCC) 07/04/2021   Arthritis    bilateral knees   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   Bradycardia    per pt report   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 04/27/2022   Chest discomfort 11/19/2019   Diabetes mellitus due to underlying condition with unspecified complications (HCC) 01/01/2018   Hyperlipidemia    on meds   Hypertension    Obesity (BMI 30.0-34.9) 07/04/2021   Osteopenia 10/2016   T score -1.3 FRAX 3.5%/0.1%   Rash and other nonspecific skin eruption 06/28/2021   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     Social History   Socioeconomic History   Marital status: Married    Spouse name: Not on file   Number of children: Not on file   Years of education: Not on file   Highest education level: Bachelor's degree (e.g., BA, AB, BS)  Occupational History   Not on file  Tobacco Use   Smoking status: Never   Smokeless tobacco: Never  Vaping Use   Vaping status: Never Used  Substance and Sexual Activity   Alcohol use: No   Drug use: No   Sexual activity: Yes    Partners: Male    Birth control/protection: Surgical    Comment: 1st intercourse- 23, partners - 2, married- 20 yrs, hysterectomy  Other Topics Concern   Not on file  Social History Narrative   Not on file   Social Drivers of Health   Financial Resource Strain: Medium Risk (09/12/2023)   Overall Financial Resource Strain (CARDIA)    Difficulty of Paying Living Expenses: Somewhat hard  Food Insecurity: Patient  Declined (09/12/2023)   Hunger Vital Sign    Worried About Running Out of Food in the Last Year: Patient declined    Ran Out of Food in the Last Year: Patient declined  Transportation Needs: Unknown (09/12/2023)   PRAPARE - Administrator, Civil Service (Medical): Not on file    Lack of Transportation (Non-Medical): No  Physical Activity: Sufficiently Active (09/12/2023)   Exercise Vital Sign    Days  of Exercise per Week: 4 days    Minutes of Exercise per Session: 70 min  Stress: No Stress Concern Present (09/12/2023)   [employer] of Occupational Health - Occupational Stress Questionnaire    Feeling of Stress: Not at all  Social Connections: Moderately Isolated (09/12/2023)   Social Connection and Isolation Panel    Frequency of Communication with Friends and Family: More than three times a week    Frequency of Social Gatherings with Friends and Family: Once a week    Attends Religious Services: Patient declined    Database administrator or Organizations: No    Attends Engineer, structural: Not on file    Marital Status: Married  Catering manager Violence: Not on file    Past Surgical History:  Procedure Laterality Date   CATARACT EXTRACTION Left    CESAREAN SECTION     x2   CHOLECYSTECTOMY, LAPAROSCOPIC  2010   COLONOSCOPY  03/2020   KNEE ARTHROSCOPY W/ MENISCAL REPAIR Left 2011   PARTIAL HYSTERECTOMY  1988    Family History  Problem Relation Age of Onset   Heart attack Maternal Grandmother    Colon polyps Neg Hx    Colon cancer Neg Hx    Esophageal cancer Neg Hx    Stomach cancer Neg Hx    Rectal cancer Neg Hx     No Known Allergies  Current Outpatient Medications on File Prior to Visit  Medication Sig Dispense Refill   ACCU-CHEK GUIDE test strip USE TO TEST BLOOD GLUCOSE FOUR TIMES DAILY AS DIRECTED 200 strip 12   atorvastatin  (LIPITOR) 10 MG tablet Take 1 tablet (10 mg total) by mouth daily. 90 tablet 3   Barberry-Oreg Grape-Goldenseal (BERBERINE COMPLEX PO) Take by mouth.     Cholecalciferol (VITAMIN D3) 125 MCG (5000 UT) CAPS Take 5,000 Units by mouth daily.     fenofibrate  (TRICOR ) 48 MG tablet Take 1 tablet (48 mg total) by mouth daily. 90 tablet 3   fluticasone  (FLONASE ) 50 MCG/ACT nasal spray Place 2 sprays into both nostrils daily. 48 g 1   JANUVIA  25 MG tablet TAKE 1 TABLET(25 MG) BY MOUTH DAILY 30 tablet 3   losartan  (COZAAR ) 50 MG tablet  Take 1 tablet (50 mg total) by mouth daily. 90 tablet 0   MAGNESIUM PO Take 1 tablet by mouth daily.     metFORMIN  (GLUCOPHAGE ) 500 MG tablet TAKE 2 TABLETS BY MOUTH EVERY MORNING AND 1 IN PM 270 tablet 1   pregabalin  (LYRICA ) 75 MG capsule Take 1 capsule (75 mg total) by mouth 2 (two) times daily. 60 capsule 1   No current facility-administered medications on file prior to visit.    BP 128/64   Pulse 84   Temp 98 F (36.7 C)   Resp 16   Ht 5' 2 (1.575 m)   Wt 176 lb (79.8 kg)   SpO2 95%   BMI 32.19 kg/m         Objective:   Physical Exam  General Mental Status- Alert. General  Appearance- Not in acute distress.   Skin General: Color- Normal Color. Moisture- Normal Moisture.  Neck Carotid Arteries- Normal color. Moisture- Normal Moisture. No carotid bruits. No JVD.  Chest and Lung Exam Auscultation: Breath Sounds:-Normal.  Cardiovascular Auscultation:Rythm- Regular. Murmurs & Other Heart Sounds:Auscultation of the heart reveals- No Murmurs.  Abdomen Inspection:-Inspeection Normal. Palpation/Percussion:Note:No mass. Palpation and Percussion of the abdomen reveal- Non Tender, Non Distended + BS, no rebound or guarding.   Neurologic Cranial Nerve exam:- CN III-XII intact(No nystagmus), symmetric smile. Strength:- 5/5 equal and symmetric strength both upper and lower extremities.       Assessment & Plan:   Patient Instructions  Type 2 Diabetes Mellitus Januvia  preferred over Metformin  due to perceived efficacy. No contraindications for Metformin . Blood glucose monitoring to assess Metformin  necessity. - Order A1c and metabolic panel. - Prescribe Januvia  25 mg with three refills. - Monitor blood glucose levels.  Hypertriglyceridemia Triglycerides slightly elevated. On fenofibrate  and atorvastatin . - Continue fenofibrate  and atorvastatin .  Colonic Polyp Benign polyp removed. No repeat colonoscopy recommend per GI MD.due to benign nature and age..  Recommendation aligns with guidelines. - Refer to gastroenterologist for further explanation as you are concerned about benign but potential for cancer in future.  Vitamin D  Deficiency Vitamin D  level borderline. Difficulty raising levels. Plan to increase dosage. - Prescribe vitamin D  50,000 IU weekly for 12 weeks. - Continue over-the-counter vitamin D  5000 IU daily. - Recheck vitamin D  levels in 3 months.   Follow up in 3 months or sooner if needed   [employer], PA-C

## 2023-09-13 NOTE — Patient Instructions (Addendum)
 Type 2 Diabetes Mellitus Januvia  preferred over Metformin  due to perceived efficacy. No contraindications for Metformin . Blood glucose monitoring to assess Metformin  necessity. - Order A1c and metabolic panel. - Prescribe Januvia  25 mg with three refills. - Monitor blood glucose levels.  Hypertriglyceridemia Triglycerides slightly elevated. On fenofibrate  and atorvastatin . - Continue fenofibrate  and atorvastatin .  Colonic Polyp Benign polyp removed. No repeat colonoscopy recommend per GI MD. Due to benign nature and age.. Recommendation aligns with guidelines. - Refer to gastroenterologist for further explanation as you are concerned about benign but potential for cancer in future.  Vitamin D  Deficiency Vitamin D  level borderline. Difficulty raising levels. Plan to increase dosage. - Prescribe vitamin D  50,000 IU weekly for 12 weeks. - Continue over-the-counter vitamin D  5000 IU daily. - Recheck vitamin D  levels in 3 months.   Follow up in 3 months or sooner if needed

## 2023-09-27 ENCOUNTER — Ambulatory Visit: Admitting: Orthopedic Surgery

## 2023-10-02 DIAGNOSIS — H04123 Dry eye syndrome of bilateral lacrimal glands: Secondary | ICD-10-CM | POA: Diagnosis not present

## 2023-10-02 DIAGNOSIS — H524 Presbyopia: Secondary | ICD-10-CM | POA: Diagnosis not present

## 2023-10-02 DIAGNOSIS — H25811 Combined forms of age-related cataract, right eye: Secondary | ICD-10-CM | POA: Diagnosis not present

## 2023-10-02 DIAGNOSIS — H43813 Vitreous degeneration, bilateral: Secondary | ICD-10-CM | POA: Diagnosis not present

## 2023-10-02 DIAGNOSIS — E119 Type 2 diabetes mellitus without complications: Secondary | ICD-10-CM | POA: Diagnosis not present

## 2023-10-02 LAB — HM DIABETES EYE EXAM

## 2023-10-23 ENCOUNTER — Ambulatory Visit (INDEPENDENT_AMBULATORY_CARE_PROVIDER_SITE_OTHER): Admitting: Medical

## 2023-10-23 ENCOUNTER — Encounter: Payer: Self-pay | Admitting: Medical

## 2023-10-23 VITALS — BP 130/75 | HR 96 | Temp 98.0°F | Resp 18 | Ht 62.0 in | Wt 176.0 lb

## 2023-10-23 DIAGNOSIS — L309 Dermatitis, unspecified: Secondary | ICD-10-CM

## 2023-10-23 DIAGNOSIS — M5431 Sciatica, right side: Secondary | ICD-10-CM | POA: Diagnosis not present

## 2023-10-23 DIAGNOSIS — R21 Rash and other nonspecific skin eruption: Secondary | ICD-10-CM | POA: Diagnosis not present

## 2023-10-23 DIAGNOSIS — L299 Pruritus, unspecified: Secondary | ICD-10-CM

## 2023-10-23 MED ORDER — HYDROXYZINE HCL 10 MG PO TABS: 10.0000 mg | ORAL_TABLET | Freq: Three times a day (TID) | ORAL | 0 refills | Status: DC | PRN

## 2023-10-23 MED ORDER — METHYLPREDNISOLONE 4 MG PO TABS: ORAL_TABLET | ORAL | 0 refills | Status: DC

## 2023-10-23 NOTE — Progress Notes (Signed)
 Subjective:    Patient ID: Janet Peters, female    DOB: 1950-10-07, 73 y.o.   MRN: [id number]  HPI Janet Peters is a 73 year old female who presents with persistent itching and skin discoloration.  She has a long-standing issue with her skin, primarily affecting her arms, which has been ongoing for years. The condition causes significant itching, leading to frequent scratching, which exacerbates the appearance of her skin. The itching sometimes becomes intolerable.  She has been prescribed Clobestol cream, a strong steroid, by a dermatologist, but it has not been effective and has caused changes in skin color, making it darker. When she stops using the cream, her skin color begins to return to normal. Benadryl also seems to darken her skin.  She recalls a significant episode four years ago following her mother's death, where the condition affected her leg and lasted for a year. A past skin biopsy did not reveal any infection or specific diagnosis.  She uses over-the-counter moisturizers like Nivea and baby oil to manage dryness, applying them to hydrate the affected areas. No significant issues on her face, although she experiences occasional itching there.  She has seen multiple dermatologists over the years, including one in [place] , but has not found a lasting solution to her condition.  She reports no rash on her face, but does experience occasional mild itching there. The itching is primarily on her arms and occasionally on her legs.    Review of Systems  Constitutional:  Negative for chills, fatigue and fever.  Respiratory:  Negative for cough, chest tightness, shortness of breath and wheezing.   Cardiovascular:  Negative for chest pain and palpitations.  Gastrointestinal:  Negative for abdominal pain, diarrhea and rectal pain.  Genitourinary:  Negative for dysuria and frequency.  Musculoskeletal:  Negative for back pain, myalgias and neck stiffness.  Skin:   Positive for rash.  Neurological:  Negative for facial asymmetry and numbness.  Hematological:  Negative for adenopathy.  Psychiatric/Behavioral:  Negative for behavioral problems and decreased concentration.     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   Bradycardia    per pt report   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 04/27/2022   Chest discomfort 11/19/2019   Diabetes mellitus due to underlying condition with unspecified complications (HCC) 01/01/2018   Hyperlipidemia    on meds   Hypertension    Obesity (BMI 30.0-34.9) 07/04/2021   Osteopenia 10/2016   T score -1.3 FRAX 3.5%/0.1%   Rash and other nonspecific skin eruption 06/28/2021   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     Social History   Socioeconomic History   Marital status: Married    Spouse name: Not on file   Number of children: Not on file   Years of education: Not on file   Highest education level: Bachelor's degree (e.g., BA, AB, BS)  Occupational History   Not on file  Tobacco Use   Smoking status: Never   Smokeless tobacco: Never  Vaping Use   Vaping status: Never Used  Substance and Sexual Activity   Alcohol use: No   Drug use: No   Sexual activity: Yes    Partners: Male    Birth  control/protection: Surgical    Comment: 1st intercourse- 23, partners - 2, married- 20 yrs, hysterectomy  Other Topics Concern   Not on file  Social History Narrative   Not on file   Social Drivers of Health   Financial Resource Strain: Medium Risk (09/12/2023)   Overall Financial Resource Strain (CARDIA)    Difficulty of Paying Living Expenses: Somewhat hard  Food Insecurity: Patient Declined (09/12/2023)   Hunger Vital Sign     Worried About Running Out of Food in the Last Year: Patient declined    Ran Out of Food in the Last Year: Patient declined  Transportation Needs: Unknown (09/12/2023)   PRAPARE - Administrator, Civil Service (Medical): Not on file    Lack of Transportation (Non-Medical): No  Physical Activity: Sufficiently Active (09/12/2023)   Exercise Vital Sign    Days of Exercise per Week: 4 days    Minutes of Exercise per Session: 70 min  Stress: No Stress Concern Present (09/12/2023)   [employer] of Occupational Health - Occupational Stress Questionnaire    Feeling of Stress: Not at all  Social Connections: Moderately Isolated (09/12/2023)   Social Connection and Isolation Panel    Frequency of Communication with Friends and Family: More than three times a week    Frequency of Social Gatherings with Friends and Family: Once a week    Attends Religious Services: Patient declined    Database administrator or Organizations: No    Attends Engineer, structural: Not on file    Marital Status: Married  Catering manager Violence: Not on file    Past Surgical History:  Procedure Laterality Date   CATARACT EXTRACTION Left    CESAREAN SECTION     x2   CHOLECYSTECTOMY, LAPAROSCOPIC  2010   COLONOSCOPY  03/2020   KNEE ARTHROSCOPY W/ MENISCAL REPAIR Left 2011   PARTIAL HYSTERECTOMY  1988    Family History  Problem Relation Age of Onset   Heart attack Maternal Grandmother    Colon polyps Neg Hx    Colon cancer Neg Hx    Esophageal cancer Neg Hx    Stomach cancer Neg Hx    Rectal cancer Neg Hx     No Known Allergies  Current Outpatient Medications on File Prior to Visit  Medication Sig Dispense Refill   ACCU-CHEK GUIDE test strip USE TO TEST BLOOD GLUCOSE FOUR TIMES DAILY AS DIRECTED 200 strip 12   atorvastatin  (LIPITOR) 10 MG tablet Take 1 tablet (10 mg total) by mouth daily. 90 tablet 3   Barberry-Oreg Grape-Goldenseal (BERBERINE COMPLEX PO) Take by mouth.      Cholecalciferol (VITAMIN D3) 125 MCG (5000 UT) CAPS Take 5,000 Units by mouth daily.     fenofibrate  (TRICOR ) 48 MG tablet Take 1 tablet (48 mg total) by mouth daily. 90 tablet 3   fluticasone  (FLONASE ) 50 MCG/ACT nasal spray Place 2 sprays into both nostrils daily. 48 g 1   JANUVIA  25 MG tablet TAKE 1 TABLET(25 MG) BY MOUTH DAILY 30 tablet 3   losartan  (COZAAR ) 50 MG tablet Take 1 tablet (50 mg total) by mouth daily. 90 tablet 0   MAGNESIUM PO Take 1 tablet by mouth daily.     metFORMIN  (GLUCOPHAGE ) 500 MG tablet TAKE 2 TABLETS BY MOUTH EVERY MORNING AND 1 IN PM 270 tablet 1   pregabalin  (LYRICA ) 75 MG capsule Take 1 capsule (75 mg total) by mouth 2 (two) times daily. 60 capsule 1   sitaGLIPtin  (JANUVIA )  25 MG tablet 1 tab po q day 90 tablet 3   Vitamin D , Ergocalciferol , (DRISDOL ) 1.25 MG (50000 UNIT) CAPS capsule Take 1 capsule (50,000 Units total) by mouth every 7 (seven) days. 12 capsule 0   No current facility-administered medications on file prior to visit.    BP 130/75   Pulse 96   Temp 98 F (36.7 C)   Resp 18   Ht 5' 2 (1.575 m)   Wt 176 lb (79.8 kg)   SpO2 98%   BMI 32.19 kg/m        Objective:   Physical Exam  General Mental Status- Alert. General Appearance- Not in acute distress.   Skin Hyperpigmented rash on both forearms. Rough mild lichinfied appearance.  Neck  No JVD.  Chest and Lung Exam Auscultation: Breath Sounds:-CTA  Cardiovascular Auscultation:Rythm- RRR Murmurs & Other Heart Sounds:Auscultation of the heart reveals- No Murmurs.  Abdomen Inspection:-Inspeection Normal. Palpation/Percussion:Note:No mass. Palpation and Percussion of the abdomen reveal- Non Tender, Non Distended + BS, no rebound or guarding.  Neurologic Cranial Nerve exam:- CN III-XII intact(No nystagmus), symmetric smile. Strength:- 5/5 equal and symmetric strength both upper and lower extremities.       Assessment & Plan:   Patient Instructions  Chronic pruritic  dermatitis (neurodermatitis, lichen simplex chronicus) Chronic pruritic dermatitis on arms, previously on legs, with resistance to potent topical steroids. Improved skin color after stopping Clobesstol per pt report. Possible neurodermatitis due to stress or nervous factors. No facial lesions but potential involvement. - Prescribed hydroxyzine  10 mg at night, assess for drowsiness, may increase to three times daily if needed/tolerated. - Advised use of vitamin E moisturizer, such as Palmer's, twice daily. - Referred to [facility] for further evaluation and possible repeat skin biopsy. - Scheduled follow-up in September or sooner if needed  Back pain(brought up at the end of visit). Rt side sciatica with pain radiates to right groin area at time. Lyrica  does not help, side effect and makes too sleepy.  -discussed short course medrol  4 mg 3 tab day 1, 2 tab po day 2 and 1 tab po day 3.   Lulu Hirschmann, PA-C

## 2023-10-23 NOTE — Patient Instructions (Signed)
 Chronic pruritic dermatitis (neurodermatitis, lichen simplex chronicus) Chronic pruritic dermatitis on arms, previously on legs, with resistance to potent topical steroids. Improved skin color after stopping Clobesstol per pt report. Possible neurodermatitis due to stress or nervous factors. No facial lesions but potential involvement. - Prescribed hydroxyzine  10 mg at night, assess for drowsiness, may increase to three times daily if needed/tolerated. - Advised use of vitamin E moisturizer, such as Palmer's, twice daily. - Referred to [facility] for further evaluation and possible repeat skin biopsy. - Scheduled follow-up in September or sooner if needed  Back pain(brought up at the end of visit). Rt side sciatica with pain radiates to right groin area at time. Lyrica  does not help, side effect and makes too sleepy.  -discussed short course medrol  4 mg 3 tab day 1, 2 tab po day 2 and 1 tab po day 3.

## 2023-12-01 ENCOUNTER — Other Ambulatory Visit: Payer: Self-pay | Admitting: Medical

## 2023-12-19 ENCOUNTER — Encounter: Payer: Self-pay | Admitting: Internal Medicine

## 2024-01-11 ENCOUNTER — Other Ambulatory Visit: Payer: Self-pay | Admitting: Medical

## 2024-01-16 ENCOUNTER — Ambulatory Visit: Admitting: Orthopaedic Surgery

## 2024-01-16 ENCOUNTER — Other Ambulatory Visit: Payer: Self-pay | Admitting: Medical

## 2024-01-16 DIAGNOSIS — M65331 Trigger finger, right middle finger: Secondary | ICD-10-CM

## 2024-01-16 MED ORDER — METHYLPREDNISOLONE ACETATE 40 MG/ML IJ SUSP
13.3300 mg | INTRAMUSCULAR | Status: AC | PRN
  Administered 2024-01-16: 13.33 mg

## 2024-01-16 MED ORDER — LIDOCAINE HCL 1 % IJ SOLN
0.3000 mL | INTRAMUSCULAR | Status: AC | PRN
  Administered 2024-01-16: .3 mL

## 2024-01-16 MED ORDER — BUPIVACAINE HCL 0.5 % IJ SOLN
0.3300 mL | INTRAMUSCULAR | Status: AC | PRN
  Administered 2024-01-16: .33 mL

## 2024-01-16 NOTE — Progress Notes (Addendum)
 Office Visit Note   Patient: Janet Peters           Date of Birth: 14-May-1950           MRN: [id number] Visit Date: 01/16/2024              Requested by: Dorina Loving, PA-C [id number] [place],  [place] [address] PCP: Dorina Loving, PA-C   Assessment & Plan: Visit Diagnoses:  1. Trigger finger, right middle finger     Plan: History of Present Illness Ipek Renlee Floor is a 73 year old female who presents with recurrent right locking middle finger.  She experiences recurrent locking of the middle finger, previously managed with an injection in February that provided relief for approximately six months. The locking has returned, affecting her ability to perform daily activities such as cooking and painting. She inquires about the recovery process after surgery, specifically regarding her ability to cook and paint during recovery.  Examination of the right hand is consistent with recurrent right middle trigger finger  Assessment and Plan Right middle finger trigger finger status post 1 steroid injection.  Prior corticosteroid injection providing six months relief. - Discussed treatment options: repeat corticosteroid injection or surgery. - Discussed the effectiveness of surgery versus repeat injection - Patient elected to repeat cortisone injection today which she tolerated well. - Language barrier increased complexity of the visit.  Follow-Up Instructions: No follow-ups on file.   Orders:  No orders of the defined types were placed in this encounter.  No orders of the defined types were placed in this encounter.     Procedures: Hand/UE Inj: R long A1 for trigger finger on 01/16/2024 1:16 PM Indications: pain Details: 25 G needle Medications: 0.3 mL lidocaine  1 %; 0.33 mL bupivacaine  0.5 %; 13.33 mg methylPREDNISolone  acetate 40 MG/ML Outcome: tolerated well, no immediate complications Consent was given by the patient. Patient was prepped  and draped in the usual sterile fashion.       Clinical Data: No additional findings.   Subjective: Chief Complaint  Patient presents with   Right Middle Finger - Pain    Trigger Finger    HPI  Review of Systems  Constitutional: Negative.   HENT: Negative.    Eyes: Negative.   Respiratory: Negative.    Cardiovascular: Negative.   Endocrine: Negative.   Musculoskeletal: Negative.   Neurological: Negative.   Hematological: Negative.   Psychiatric/Behavioral: Negative.    All other systems reviewed and are negative.    Objective: Vital Signs: There were no vitals taken for this visit.  Physical Exam Vitals and nursing note reviewed.  Constitutional:      Appearance: She is well-developed.  HENT:     Head: Atraumatic.     Nose: Nose normal.  Eyes:     Extraocular Movements: Extraocular movements intact.  Cardiovascular:     Pulses: Normal pulses.  Pulmonary:     Effort: Pulmonary effort is normal.  Abdominal:     Palpations: Abdomen is soft.  Musculoskeletal:     Cervical back: Neck supple.  Skin:    General: Skin is warm.     Capillary Refill: Capillary refill takes less than 2 seconds.  Neurological:     Mental Status: She is alert. Mental status is at baseline.  Psychiatric:        Behavior: Behavior normal.        Thought Content: Thought content normal.        Judgment:  Judgment normal.     Ortho Exam  Specialty Comments:  No specialty comments available.  Imaging: No results found.   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   Allergic rhinitis due to pollen 02/23/2021   Allergy    Arthritis    Bradycardia     Diabetes mellitus without complication (HCC)    Hyperlipidemia    Chest discomfort 11/19/2019   Mixed dyslipidemia 01/01/2018   Diabetes mellitus due to underlying condition with unspecified complications (HCC) 01/01/2018   Age-related nuclear cataract of left eye 04/24/2017   Osteopenia 10/2016   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 04/27/2022   Chest discomfort 11/19/2019   Diabetes mellitus due to underlying condition with unspecified complications (HCC) 01/01/2018   Hyperlipidemia    on meds   Hypertension    Obesity (BMI 30.0-34.9) 07/04/2021   Osteopenia 10/2016   T score -1.3 FRAX 3.5%/0.1%   Rash and other nonspecific skin eruption 06/28/2021   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    Family History  Problem Relation Age of Onset   Heart attack Maternal Grandmother    Colon polyps Neg Hx    Colon cancer Neg Hx    Esophageal cancer Neg Hx    Stomach cancer Neg Hx    Rectal cancer Neg Hx     Past Surgical History:  Procedure Laterality Date   CATARACT EXTRACTION Left    CESAREAN SECTION     x2   CHOLECYSTECTOMY, LAPAROSCOPIC  2010   COLONOSCOPY  03/2020   KNEE ARTHROSCOPY W/ MENISCAL REPAIR Left 2011   PARTIAL HYSTERECTOMY  1988   Social History   Occupational History   Not on file  Tobacco Use   Smoking status: Never   Smokeless tobacco: Never  Vaping Use   Vaping status: Never Used  Substance and Sexual Activity   Alcohol use: No   Drug use: No   Sexual activity: Yes    Partners: Male    Birth control/protection: Surgical    Comment: 1st intercourse- 23, partners - 2, married- 20 yrs, hysterectomy

## 2024-01-18 ENCOUNTER — Ambulatory Visit: Admitting: Medical

## 2024-01-18 ENCOUNTER — Encounter: Payer: Self-pay | Admitting: Pharmacist

## 2024-01-18 VITALS — BP 124/80 | HR 78 | Resp 16 | Ht 62.0 in | Wt 178.8 lb

## 2024-01-18 DIAGNOSIS — Z23 Encounter for immunization: Secondary | ICD-10-CM

## 2024-01-18 DIAGNOSIS — E7849 Other hyperlipidemia: Secondary | ICD-10-CM

## 2024-01-18 DIAGNOSIS — E109 Type 1 diabetes mellitus without complications: Secondary | ICD-10-CM | POA: Diagnosis not present

## 2024-01-18 DIAGNOSIS — E119 Type 2 diabetes mellitus without complications: Secondary | ICD-10-CM

## 2024-01-18 DIAGNOSIS — M5432 Sciatica, left side: Secondary | ICD-10-CM | POA: Diagnosis not present

## 2024-01-18 DIAGNOSIS — Z7984 Long term (current) use of oral hypoglycemic drugs: Secondary | ICD-10-CM | POA: Diagnosis not present

## 2024-01-18 DIAGNOSIS — E559 Vitamin D deficiency, unspecified: Secondary | ICD-10-CM | POA: Diagnosis not present

## 2024-01-18 LAB — LIPID PANEL
Cholesterol: 139 mg/dL (ref 0–200)
HDL: 55.8 mg/dL (ref >39.00)
LDL Cholesterol: 62 mg/dL (ref 0–99)
NonHDL: 82.96
Total CHOL/HDL Ratio: 2
Triglycerides: 107 mg/dL (ref 0.0–149.0)
VLDL: 21.4 mg/dL (ref 0.0–40.0)

## 2024-01-18 LAB — HEMOGLOBIN A1C: Hgb A1c MFr Bld: 6.9 % — ABNORMAL HIGH (ref 4.6–6.5)

## 2024-01-18 LAB — VITAMIN D 25 HYDROXY (VIT D DEFICIENCY, FRACTURES): VITD: 39.48 ng/mL (ref 30.00–100.00)

## 2024-01-18 MED ORDER — DICLOFENAC SODIUM 75 MG PO TBEC: 75.0000 mg | DELAYED_RELEASE_TABLET | Freq: Two times a day (BID) | ORAL | 0 refills | Status: DC

## 2024-01-18 NOTE — Progress Notes (Signed)
 Subjective:    Patient ID: Janet Peters, female    DOB: [birth year], 73 y.o.   MRN: [id number]  HPI  Janet Peters is a 73 year old female who presents with sciatic pain.  She has been experiencing sciatic pain for about a week, primarily located in the sciatic area. The pain is exacerbated when standing or sitting, reaching a severity of 8 out of 10, but it does not bother her while walking. She initially thought the pain might be related to a gym activity but is unsure.  Her past treatments for lumbar pain included pregabalin , which she continues to take, and a short course of Medrol . She has previously used Toradol , Mobic , gabapentin , and pregabalin  for lumbar pain, but these did not alleviate her symptoms(only on lyrica  now). She has not recently used any anti-inflammatory medications like ibuprofen.  She is currently taking losartan  50 mg for hypertension, Januvia  25 mg, and metformin  500 mg tfor diabetes, although hd advised higher dose. She also takes vitamin D  supplements, having completed a course of 50,000 IU weekly for eight weeks and now taking 5,000 IU daily over the counter.  Her last check of A1c and kidney function was over three months ago, and she is due for a re-evaluation. Her cholesterol was last checked in February, with slightly elevated triglycerides, and she is fasting today for a new test.       Review of Systems  Constitutional:  Negative for chills and fever.  HENT:  Negative for rhinorrhea, sinus pressure and sore throat.   Respiratory:  Negative for choking and wheezing.   Cardiovascular:  Negative for chest pain and palpitations.  Gastrointestinal:  Negative for abdominal pain, constipation and nausea.  Genitourinary:  Negative for dysuria, frequency, genital sores and hematuria.  Musculoskeletal:  Positive for back pain. Negative for neck pain.       More left si area pain.  Skin:  Negative for rash.  Neurological:  Negative for  dizziness and headaches.  Hematological:  Negative for adenopathy. Does not bruise/bleed easily.  Psychiatric/Behavioral:  Negative for behavioral problems and dysphoric mood.     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 04/27/2022   Chest discomfort 11/19/2019   Diabetes mellitus due to underlying condition with unspecified complications (HCC) 01/01/2018   Hyperlipidemia    on meds   Hypertension    Obesity (BMI 30.0-34.9) 07/04/2021   Osteopenia 10/2016   T score -1.3 FRAX 3.5%/0.1%   Rash and other nonspecific skin eruption 06/28/2021   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     Social History   Socioeconomic History   Marital status: Married    Spouse name: Not on file   Number of children: Not on file   Years of education: Not on file   Highest education level: Associate degree: academic program  Occupational History   Not on file  Tobacco Use   Smoking status: Never   Smokeless tobacco: Never  Vaping Use   Vaping status: Never Used  Substance and Sexual Activity   Alcohol use: No   Drug use: No   Sexual activity:  Yes    Partners: Male    Birth control/protection: Surgical    Comment: 1st intercourse- 23, partners - 2, married- 20 yrs, hysterectomy  Other Topics Concern   Not on file  Social History Narrative   Not on file   Social Drivers of Health   Financial Resource Strain: Patient Declined (01/17/2024)   Overall Financial Resource Strain (CARDIA)    Difficulty of Paying Living Expenses: Patient declined  Food Insecurity: Patient Declined (01/17/2024)   Hunger Vital Sign    Worried About Running Out of Food  in the Last Year: Patient declined    Ran Out of Food in the Last Year: Patient declined  Transportation Needs: Patient Declined (01/17/2024)   PRAPARE - Administrator, Civil Service (Medical): Patient declined    Lack of Transportation (Non-Medical): Patient declined  Physical Activity: Sufficiently Active (01/17/2024)   Exercise Vital Sign    Days of Exercise per Week: 4 days    Minutes of Exercise per Session: 70 min  Stress: Patient Declined (01/17/2024)   [employer] of Occupational Health - Occupational Stress Questionnaire    Feeling of Stress: Patient declined  Social Connections: Moderately Isolated (01/17/2024)   Social Connection and Isolation Panel    Frequency of Communication with Friends and Family: More than three times a week    Frequency of Social Gatherings with Friends and Family: Patient declined    Attends Religious Services: Patient declined    Database administrator or Organizations: No    Attends Engineer, structural: Not on file    Marital Status: Married  Catering manager Violence: Not on file    Past Surgical History:  Procedure Laterality Date   CATARACT EXTRACTION Left    CESAREAN SECTION     x2   CHOLECYSTECTOMY, LAPAROSCOPIC  2010   COLONOSCOPY  03/2020   KNEE ARTHROSCOPY W/ MENISCAL REPAIR Left 2011   PARTIAL HYSTERECTOMY  1988    Family History  Problem Relation Age of Onset   Heart attack Maternal Grandmother    Colon polyps Neg Hx    Colon cancer Neg Hx    Esophageal cancer Neg Hx    Stomach cancer Neg Hx    Rectal cancer Neg Hx     No Known Allergies  Current Outpatient Medications on File Prior to Visit  Medication Sig Dispense Refill   ACCU-CHEK GUIDE test strip USE TO TEST BLOOD GLUCOSE FOUR TIMES DAILY AS DIRECTED 200 strip 12   atorvastatin  (LIPITOR) 10 MG tablet Take 1 tablet (10 mg total) by mouth daily. 90 tablet 3   Barberry-Oreg Grape-Goldenseal (BERBERINE COMPLEX PO) Take by mouth.      Cholecalciferol (VITAMIN D3) 125 MCG (5000 UT) CAPS Take 5,000 Units by mouth daily.     fenofibrate  (TRICOR ) 48 MG tablet TAKE 1 TABLET(48 MG) BY MOUTH DAILY 90 tablet 3   fluticasone  (FLONASE ) 50 MCG/ACT nasal spray Place 2 sprays into both nostrils daily. 48 g 1   hydrOXYzine  (ATARAX ) 10 MG tablet Take 1 tablet (10 mg total) by mouth 3 (three) times daily as needed. 30 tablet 0   JANUVIA  25 MG tablet TAKE 1 TABLET(25 MG) BY MOUTH DAILY 30 tablet 3   losartan  (COZAAR ) 50 MG tablet TAKE 1 TABLET(50 MG) BY MOUTH DAILY 90 tablet 0   MAGNESIUM PO Take 1 tablet by mouth daily.     metFORMIN  (GLUCOPHAGE ) 500 MG tablet Take 2 tablets (1,000 mg total) by mouth in the morning AND 1  tablet (500 mg total) every evening. 270 tablet 1   methylPREDNISolone  (MEDROL ) 4 MG tablet 3 tab po day 1, 2 tab po day 2 and 1 tab po day 3 6 tablet 0   pregabalin  (LYRICA ) 75 MG capsule Take 1 capsule (75 mg total) by mouth 2 (two) times daily. 60 capsule 1   sitaGLIPtin  (JANUVIA ) 25 MG tablet 1 tab po q day 90 tablet 3   No current facility-administered medications on file prior to visit.    BP 124/80   Pulse 78   Resp 16   Ht 5' 2 (1.575 m)   Wt 178 lb 12.8 oz (81.1 kg)   SpO2 97%   BMI 32.70 kg/m        Objective:   Physical Exam  General Appearance- Not in acute distress.    Chest and Lung Exam Auscultation: Breath sounds:-Normal. Clear even and unlabored. Adventitious sounds:- No Adventitious sounds.  Cardiovascular Auscultation:Rythm - Regular, rate and rythm. Heart Sounds -Normal heart sounds.  Abdomen Inspection:-Inspection Normal.  Palpation/Perucssion: Palpation and Percussion of the abdomen reveal- Non Tender, No Rebound tenderness, No rigidity(Guarding) and No Palpable abdominal masses.  Liver:-Normal.  Spleen:- Normal.   Back No Mid lumbar spine tenderness to palpation. No Pain on straight leg lift. Pain on lateral movements and flexion/extension of the spine. -left side si  tenderness to papatoin.  Lower ext neurologic  L5-S1 sensation intact bilaterally. Normal patellar reflexes bilaterally. No foot drop bilaterally.       Assessment & Plan:  Lt side sciatica - Prescribe diclofenac  for 10 days. - Consider Medrol  if no improvement by mid-week. - Continue pregabalin . - Consider lidocaine  patches for localized pain.  Type 2 diabetes mellitus Type 2 diabetes managed with Januvia  and metformin , suboptimal adherence to metformin  regimen. - Order blood tests for A1c and kidney function. - Adjust metformin  dosage based on results.  Hypertension well controlled Hypertension managed with losartan  50 mg.  Hyperlipidemia Hyperlipidemia with previously elevated triglycerides. -recheck lipid panel today. On atorvastatin  and fenofibrate .  Vitamin D  deficiency Vitamin D  deficiency previously treated, now on maintenance dose. - Order blood test to check vitamin D  levels.  Follow up date to be determined based on lab and response to tx for sciatica  Dallas Maxwell, PA-C

## 2024-01-18 NOTE — Progress Notes (Signed)
 Pharmacy Quality Measure Review  This patient is appearing on a report for being at risk of failing the adherence measure for cholesterol (statin), diabetes, and hypertension (ACEi/ARB) medications this calendar year.   Medication: losartan  50mg  Last fill date: 09/10/2023 for 90 day supply  Reviewed recent refill history in Dr Annemarie database. Actual last refill date was 01/11/2024 for 90 day supply. Patient has no refills remaining. Next appointment with PCP is today.    Medication: atorvastatin   Last fill date: 11/09/2023 for 90 day supply. Has 2 refills remaining  Medication: metformin   Last fill date: 09/01/2023 for 90 day supply. Patient has not been taking 3 tabs per day per notes form PCP visit today. He discussed adherence with her. A1c is pending.   Medication: Januvia  25mg  Last fill date: 10/11/2023 for 90 day supply - acutally filled 01/11/2024 for 90 days. Has 2 refills remaining.   Insurance report was not up to date. No action needed at this time since PCP has just discuss adherence with metformin . Will continue to follow and intervene as needed.   Madelin Ray, PharmD Clinical Pharmacist [facility]  Population Health [phone]

## 2024-01-18 NOTE — Patient Instructions (Signed)
 Lt side sciatica - Prescribe diclofenac  for 10 days. - Consider Medrol  if no improvement by mid-week. - Continue pregabalin . - Consider lidocaine  patches for localized pain.  Type 2 diabetes mellitus Type 2 diabetes managed with Januvia  and metformin , suboptimal adherence to metformin  regimen. - Order blood tests for A1c and kidney function. - Adjust metformin  dosage based on results.  Hypertension well controlled Hypertension managed with losartan  50 mg.  Hyperlipidemia Hyperlipidemia with previously elevated triglycerides. -recheck lipid panel today. On atorvastatin  and fenofibrate .  Vitamin D  deficiency Vitamin D  deficiency previously treated, now on maintenance dose. - Order blood test to check vitamin D  levels.  Follow up date to be determined based on lab and response to tx for sciatica

## 2024-01-19 ENCOUNTER — Ambulatory Visit: Payer: Self-pay | Admitting: Medical

## 2024-01-19 LAB — COMPLETE METABOLIC PANEL WITHOUT GFR
AG Ratio: 1.6 (calc) (ref 1.0–2.5)
ALT: 17 U/L (ref 6–29)
AST: 16 U/L (ref 10–35)
Albumin: 4.5 g/dL (ref 3.6–5.1)
Alkaline phosphatase (APISO): 87 U/L (ref 37–153)
BUN: 21 mg/dL (ref 7–25)
CO2: 22 mmol/L (ref 20–32)
Calcium: 9.7 mg/dL (ref 8.6–10.4)
Chloride: 107 mmol/L (ref 98–110)
Creat: 0.81 mg/dL (ref 0.60–1.00)
Globulin: 2.9 g/dL (ref 1.9–3.7)
Glucose, Bld: 99 mg/dL (ref 65–99)
Potassium: 4.6 mmol/L (ref 3.5–5.3)
Sodium: 141 mmol/L (ref 135–146)
Total Bilirubin: 0.4 mg/dL (ref 0.2–1.2)
Total Protein: 7.4 g/dL (ref 6.1–8.1)

## 2024-01-23 ENCOUNTER — Ambulatory Visit: Admitting: Dermatology

## 2024-01-23 ENCOUNTER — Encounter: Payer: Self-pay | Admitting: Dermatology

## 2024-01-23 VITALS — BP 103/64

## 2024-01-23 DIAGNOSIS — Z79899 Other long term (current) drug therapy: Secondary | ICD-10-CM

## 2024-01-23 DIAGNOSIS — L209 Atopic dermatitis, unspecified: Secondary | ICD-10-CM | POA: Diagnosis not present

## 2024-01-23 DIAGNOSIS — L299 Pruritus, unspecified: Secondary | ICD-10-CM | POA: Diagnosis not present

## 2024-01-23 MED ORDER — NEMLUVIO 30 MG ~~LOC~~ AUIJ: 30.0000 mg | AUTO-INJECTOR | SUBCUTANEOUS | 11 refills | Status: DC

## 2024-01-23 MED ORDER — NEMLUVIO 30 MG ~~LOC~~ AUIJ: 30.0000 mg | AUTO-INJECTOR | SUBCUTANEOUS | 0 refills | Status: AC

## 2024-01-23 MED ORDER — MOMETASONE FUROATE 0.1 % EX OINT: TOPICAL_OINTMENT | Freq: Every day | CUTANEOUS | 3 refills | Status: AC

## 2024-01-23 NOTE — Patient Instructions (Addendum)
 RESUMEN DE LA VISITA:  Hoy hablamos sobre su dermatitis crnica y la picazn intensa que ha estado experimentando, especialmente en los brazos. Revisamos sus tratamientos anteriores y su falta de efectividad, y hemos desarrollado un nuevo plan para ayudarle a controlar sus sntomas.  SU PLAN:  -DERMATITIS ATPICA CON PRURITO CRNICO: La dermatitis atpica es una afeccin que causa enrojecimiento y picazn en la piel. El prurito crnico significa que ha estado experimentando picazn de forma prolongada. Estamos tramitando la documentacin para la aprobacin de su seguro para la inyeccin de Nemluvio, un autoinyector mensual que combate la picazn sin suprimir el sistema inmunitario.  Mientras tanto, le recetamos crema de mometasona para aplicacin tpica, y debe usar la locin antiprurito CeraVe junto con ella, tanto por la maana como por la noche. Evite el vino si sus sntomas persisten a pesar del tratamiento.  INSTRUCCIONES:  Programaremos una cita de seguimiento para una capacitacin sobre la aplicacin de la inyeccin una vez que recibamos la aprobacin de su seguro para Nemluvio. Por favor, contine utilizando los tratamientos prescritos y controle sus sntomas.   VISIT SUMMARY:  Today, we discussed your chronic dermatitis and the severe itching you have been experiencing, particularly on your arms. We reviewed your previous treatments and their lack of effectiveness, and we have developed a new plan to help manage your symptoms.  YOUR PLAN:  -ATOPIC DERMATITIS WITH CHRONIC PRURITUS:  Atopic dermatitis is a condition that makes your skin red and itchy. Chronic pruritus means you have been experiencing long-term itching. We are submitting paperwork for insurance approval of Nemluvio injection, which is a monthly auto-injector that targets itching without suppressing your immune system.  In the meantime, you are prescribed mometasone cream to apply topically, and you should use CeraVe  anti-itch lotion along with it, both in the morning and at night. Please avoid wine if your symptoms persist despite treatment.  INSTRUCTIONS:  We will schedule a follow-up appointment for injection training once we receive insurance approval for Namluvio. Please continue to use the prescribed treatments and monitor your symptoms.    Important Information   Due to recent changes in healthcare laws, you may see results of your pathology and/or laboratory studies on MyChart before the doctors have had a chance to review them. We understand that in some cases there may be results that are confusing or concerning to you. Please understand that not all results are received at the same time and often the doctors may need to interpret multiple results in order to provide you with the best plan of care or course of treatment. Therefore, we ask that you please give us  2 business days to thoroughly review all your results before contacting the office for clarification. Should we see a critical lab result, you will be contacted sooner.     If You Need Anything After Your Visit   If you have any questions or concerns for your doctor, please call our main line at [phone]. If no one answers, please leave a voicemail as directed and we will return your call as soon as possible. Messages left after 4 pm will be answered the following business day.    You may also send us  a message via MyChart. We typically respond to MyChart messages within 1-2 business days.  For prescription refills, please ask your pharmacy to contact our office. Our fax number is [phone].  If you have an urgent issue when the clinic is closed that cannot wait until the next business day, you  can page your doctor at the number below.     Please note that while we do our best to be available for urgent issues outside of office hours, we are not available 24/7.    If you have an urgent issue and are unable to reach us , you may  choose to seek medical care at your doctor's office, retail clinic, urgent care center, or emergency room.   If you have a medical emergency, please immediately call 911 or go to the emergency department. In the event of inclement weather, please call our main line at [phone] for an update on the status of any delays or closures.  Dermatology Medication Tips: Please keep the boxes that topical medications come in in order to help keep track of the instructions about where and how to use these. Pharmacies typically print the medication instructions only on the boxes and not directly on the medication tubes.   If your medication is too expensive, please contact our office at [phone] or send us  a message through MyChart.    We are unable to tell what your co-pay for medications will be in advance as this is different depending on your insurance coverage. However, we may be able to find a substitute medication at lower cost or fill out paperwork to get insurance to cover a needed medication.    If a prior authorization is required to get your medication covered by your insurance company, please allow us  1-2 business days to complete this process.   Drug prices often vary depending on where the prescription is filled and some pharmacies may offer cheaper prices.   The website www.goodrx.com contains coupons for medications through different pharmacies. The prices here do not account for what the cost may be with help from insurance (it may be cheaper with your insurance), but the website can give you the price if you did not use any insurance.  - You can print the associated coupon and take it with your prescription to the pharmacy.  - You may also stop by our office during regular business hours and pick up a GoodRx coupon card.  - If you need your prescription sent electronically to a different pharmacy, notify our office through [facility] or by phone at [phone]

## 2024-01-23 NOTE — Progress Notes (Signed)
 New Patient Visit   Subjective  Janet Peters is a 73 y.o. female accompanied by husband [name]) and interpreter for intake portion of visit [facility] ID [phone]) & Interpreter for the remainder of the visit [name] ID #: [id number]) who presents for the following:Itchy Rash   Patient states she has rash located at the arms that she would like to have examined. Patient reports the areas have been there for 1 year. She reports the areas are bothersome. Patient reports the areas are itchy. Patient rates irritation 10 out of 10. Patient reports she has previously been treated for these areas.Currently patient reports she washes with Patient denies Hx of bx. Patient denies family history of skin cancer(s). 10% BSA IGA:2 Itch 10 out of 10  Patient has previously tried and failed: - Prednisone  03/2021 - 09/2023 - Benardyl tablets and cream 04/2017 - Current - Clobetasol  cream 08/2022 - 08/2023 - Hydroxyzine  04/2021 - Current - Famotidine  40 mg 04/2022 - 04/2023 - Xyzal  5 mg tablet 04/2022 - 04/2023 - Triamcinolone  04/2017 - 04/2020  The following portions of the chart were reviewed this encounter and updated as appropriate: medications, allergies, medical history  Review of Systems:  No other skin or systemic complaints except as noted in HPI or Assessment and Plan.  Objective  Well appearing patient in no apparent distress; mood and affect are within normal limits.  A focused examination was performed of the following areas: B/L Arms  Relevant exam findings are noted in the Assessment and Plan.       Assessment & Plan   ATOPIC DERMATITIS and PRURITUS Exam: Scaly pink papules coalescing to plaques 10% BSA IGA:2 Itch 10 out of 10  flared  Atopic dermatitis (eczema) is a chronic, relapsing, pruritic condition that can significantly affect quality of life. It is often associated with allergic rhinitis and/or asthma and can require treatment with topical medications, phototherapy, or in severe  cases biologic injectable medication (Dupixent; Adbry) or Oral JAK inhibitors.   Chronic atopic dermatitis with severe pruritus primarily affecting the arms, present since 2003. Symptoms include intense itching rated 10/10 and lichenified xerotic skin due to chronic scratching. Previous treatments with topical creams and oral medications, including prednisone , have been ineffective. Symptoms are exacerbated by stress and alcohol consumption, particularly wine, leading to facial redness and increased itching. Current management is inadequate, necessitating a new treatment approach.  - Submitted paperwork for insurance approval of Nemluvio injection, an auto-injector targeting itch without immune suppression, administered monthly. - Prescribed mometasone cream for topical application. - Recommended CeraVe anti-itch lotion for use with mometasone cream, to be applied morning and night. - Provided sample of CeraVe anti-itch lotion. - Educated on the potential need to avoid wine if symptoms persist despite treatment. - Will schedule follow-up for injection training once insurance approval is obtained.  Plan: Dupixent Initiation Indications:  Patient isn't a candidate for systemic therapy with methotrexate or cyclosporine. Patient has been unresponsive to aggressive topical therapy.  Failed Treatments: Topical Steroids and Topical Protopic  Treatment Protocol: 600 mg [name] day 0 then 300 mg [name] every other week  Specific Contraindications Cyclosporine is contraindicated because the patient will not be able to complete the necessary follow-up labs. Methotrexate is contraindicated because the patient will not be able to complete the necessary follow-up labs. Phototherapy is contraindicated because the patient lives too far from the treatment location.   Dupixent Counseling: I discussed with the patient the risks of dupilumab including but not limited to eye infection and  irritation, cold sores,  injection site reactions, worsening of asthma, allergic reactions and increased risk of parasitic infection. Live vaccines should be avoided while taking dupilumab. Dupilumab will also interact with certain medications such as warfarin and cyclosporine. The patient understands that monitoring is required and they must alert us  or the primary physician if symptoms of infection or other concerning signs are noted.  Dupixent Monitoring: There is no laboratory monitoring requirement with Dupixent.   Dupilumab (Dupixent) is a biologic treatment given by injection for adults and children with moderate-to-severe atopic dermatitis. Goal is control of skin condition, not cure. It is given as 2 injections at the first dose followed by 1 injection every 2 weeks thereafter.  Young children are dosed monthly.  Potential side effects include allergic reaction, herpes infections, injection site reactions and conjunctivitis (inflammation of the eyes).  The use of Dupixent requires long term medication management, including periodic office visits.  ATOPIC DERMATITIS, UNSPECIFIED TYPE   Related Medications mometasone (ELOCON) 0.1 % ointment Apply topically daily. Apply 2 times daily for 2 weeks mixed with CeraVe anti itch lotion  Return in about 6 months (around 07/23/2024) for Eczema F/U.  I, Jetta Ager, am acting as neurosurgeon for [employer], DO.  Documentation: I have reviewed the above documentation for accuracy and completeness, and I agree with the above.  Delon Lenis, DO

## 2024-01-24 ENCOUNTER — Encounter: Payer: Self-pay | Admitting: Medical

## 2024-01-28 ENCOUNTER — Encounter: Payer: Self-pay | Admitting: Radiology

## 2024-01-30 IMAGING — MG MM DIGITAL SCREENING BILAT W/ TOMO AND CAD
6 of 10 series · 6 of 30 positions shown · non-contrast
Comparison: Previous exam(s).

ACR Breast Density Category a: The breast tissue is almost entirely
fatty.

CLINICAL DATA: Screening.

EXAM:
DIGITAL SCREENING BILATERAL MAMMOGRAM WITH TOMOSYNTHESIS AND CAD
TECHNIQUE: Bilateral screening digital craniocaudal and mediolateral oblique
mammograms were obtained. Bilateral screening digital breast
tomosynthesis was performed. The images were evaluated with
computer-aided detection.

[R CC synth-2D]
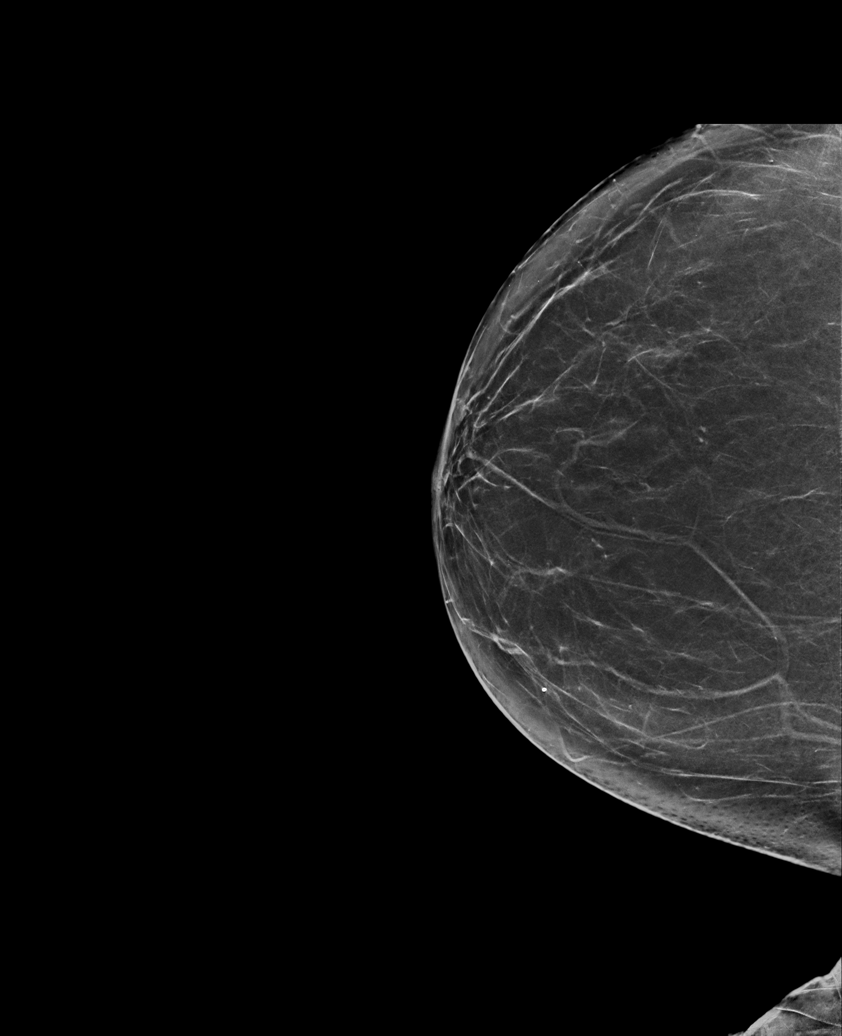

[L MLO synth-2D]
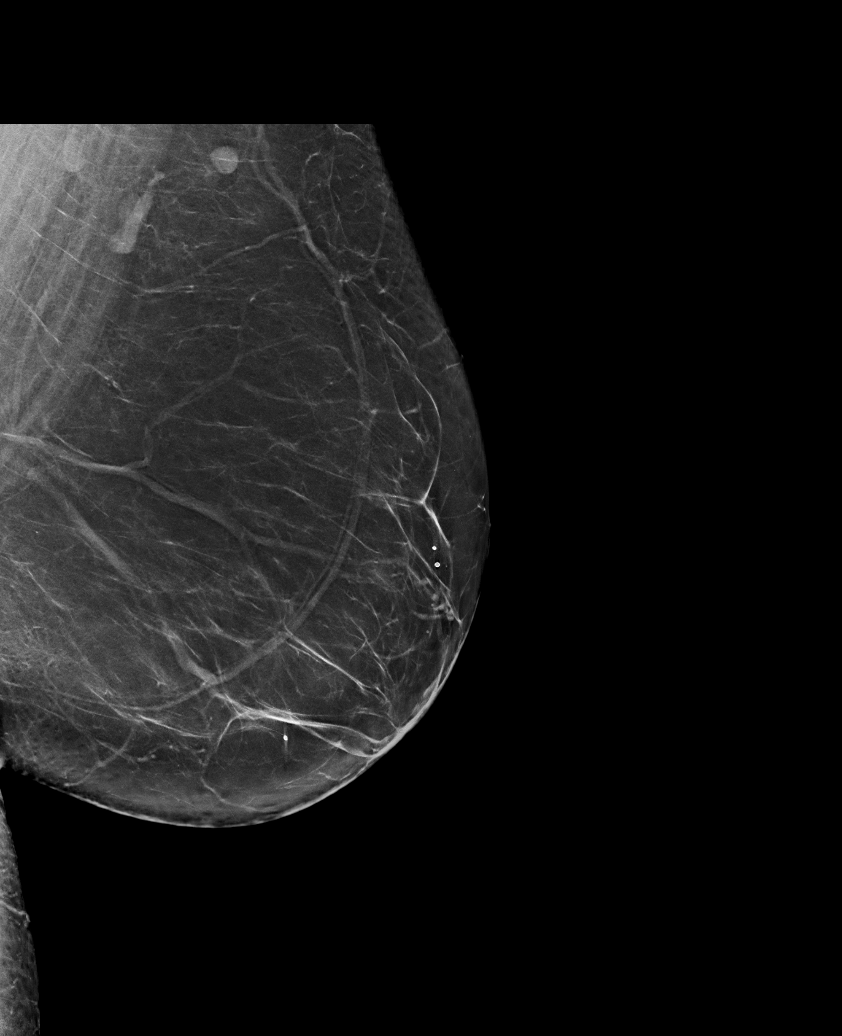

[R XCCL synth-2D]
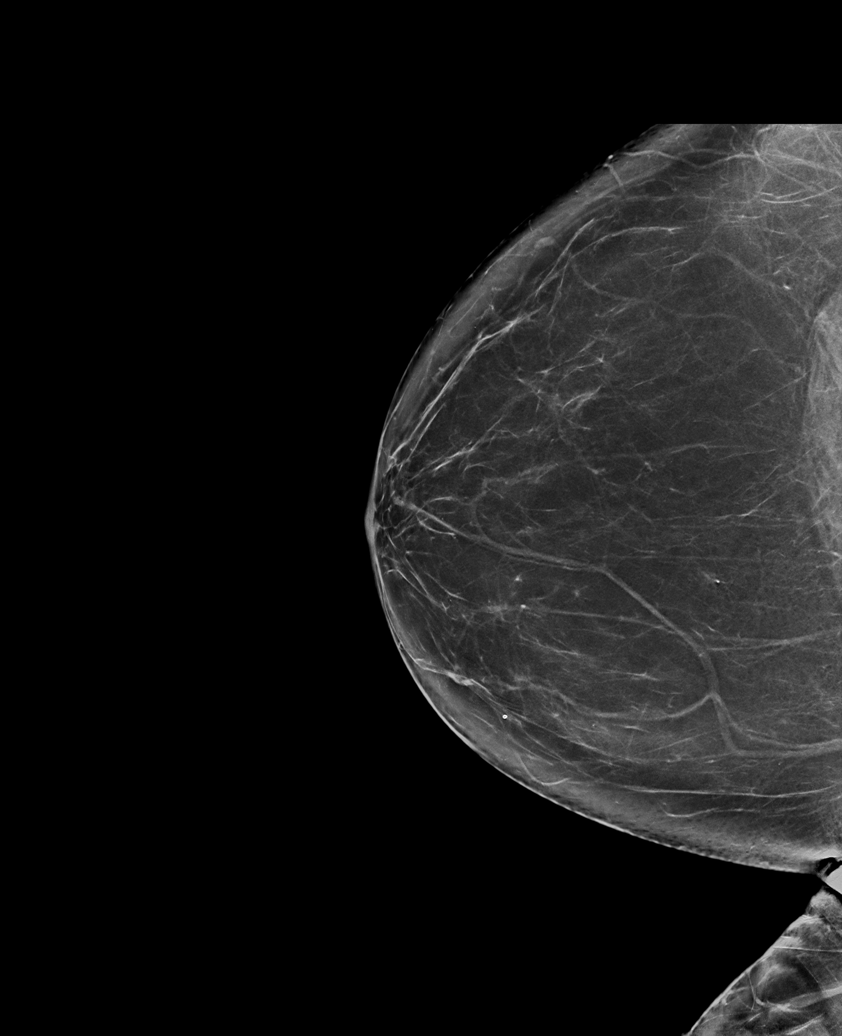

[L CC synth-2D]
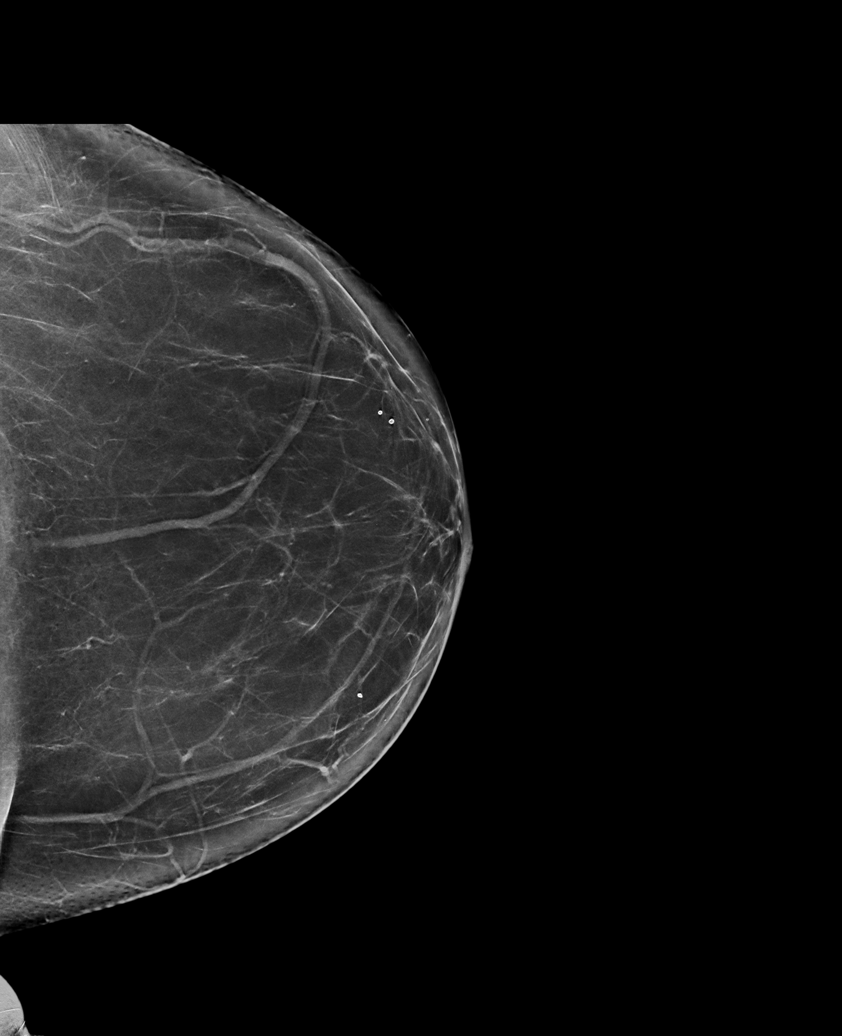

[R MLO synth-2D]
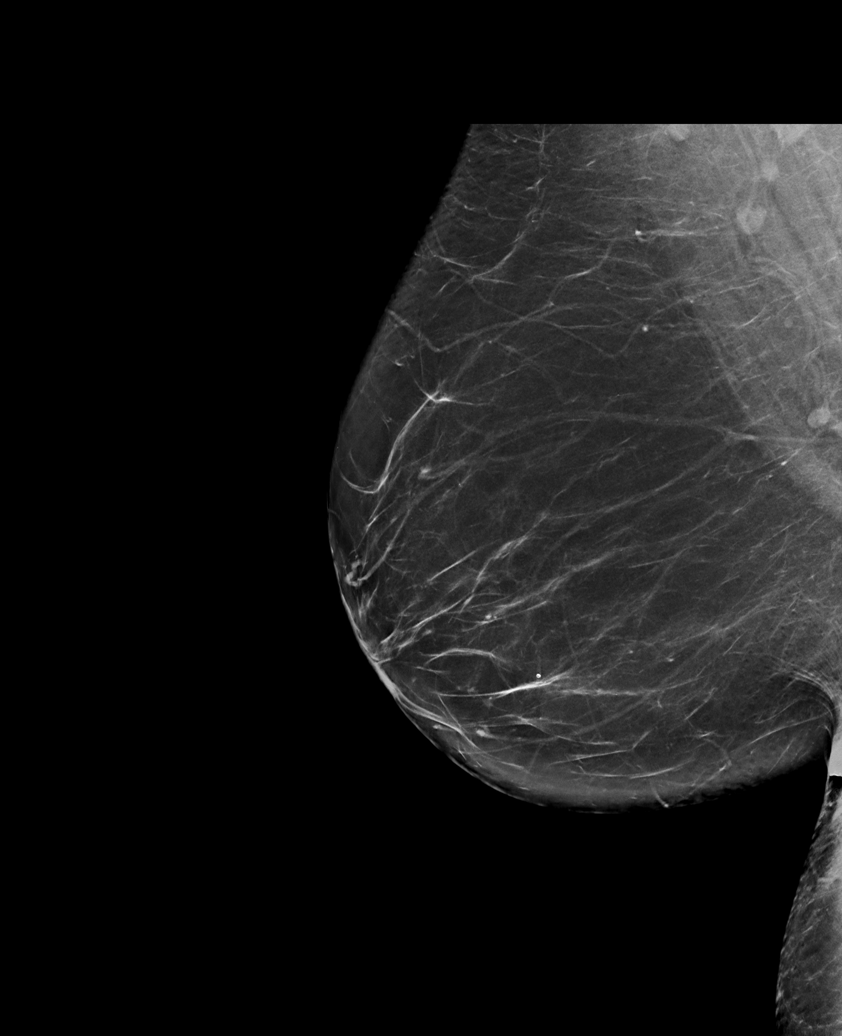

[L MLO tomo · tomo slice 43/86.0]
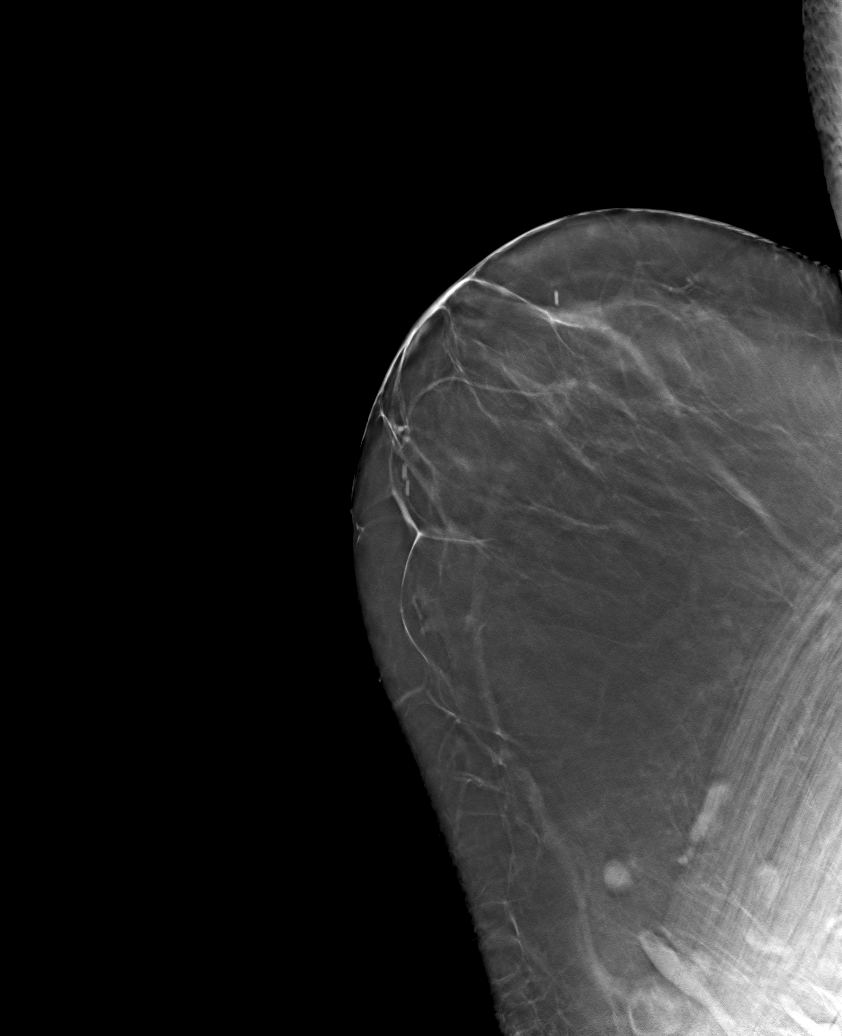

[6 of 30 positions shown; findings below may reference images not displayed]

FINDINGS: There are no findings suspicious for malignancy.
IMPRESSION: No mammographic evidence of malignancy. A result letter of this
screening mammogram will be mailed directly to the patient.

RECOMMENDATION:
Screening mammogram in one year. (Code:0E-3-N98)

BI-RADS CATEGORY  1: Negative.

## 2024-01-31 ENCOUNTER — Ambulatory Visit: Admitting: Orthopedic Surgery

## 2024-02-04 ENCOUNTER — Ambulatory Visit (INDEPENDENT_AMBULATORY_CARE_PROVIDER_SITE_OTHER): Admitting: Obstetrics and Gynecology

## 2024-02-04 VITALS — BP 127/55 | HR 101 | Ht 62.0 in | Wt 180.0 lb

## 2024-02-04 DIAGNOSIS — Z1331 Encounter for screening for depression: Secondary | ICD-10-CM

## 2024-02-04 DIAGNOSIS — Z01419 Encounter for gynecological examination (general) (routine) without abnormal findings: Secondary | ICD-10-CM | POA: Diagnosis not present

## 2024-02-04 NOTE — Progress Notes (Signed)
 [facility] language services [place] [phone].

## 2024-02-04 NOTE — Progress Notes (Addendum)
 ANNUAL GYNECOLOGY VISIT No chief complaint on file.    Subjective:  Janet Peters is a 73 y.o. G2P2002 who presents for annual.  No concerns today. Video Spanish interpreter utilized for today's visit.  Last pap: n/a - hysterectomy for benign indication in patient's 30s  Last mammogram: 06/2023  Last colonoscopy: 07/2023 Last DEXA: 12/2021 (Normal- T score > -1), next due 2028        02/04/2024    8:56 AM 07/09/2023    9:30 AM 02/14/2022    1:40 PM 09/21/2021    8:15 AM 09/10/2020    8:04 AM  Depression screen PHQ 2/9  Decreased Interest 1 1 0 0 0  Down, Depressed, Hopeless 1 1 0 0 0  PHQ - 2 Score 2 2 0 0 0  Altered sleeping 1 0     Tired, decreased energy 0 1     Change in appetite 0 0     Feeling bad or failure about yourself  0 0     Trouble concentrating 0 0     Moving slowly or fidgety/restless 0 0     Suicidal thoughts 0      PHQ-9 Score 3 3         Data saved with a previous flowsheet row definition        02/04/2024    8:57 AM 07/09/2023    9:31 AM  GAD 7 : Generalized Anxiety Score  Nervous, Anxious, on Edge 1 1  Control/stop worrying 1 3  Worry too much - different things 1 3  Trouble relaxing 0 3  Restless 0 1  Easily annoyed or irritable 1 1  Afraid - awful might happen 2 2  Total GAD 7 Score 6 14      OB History     Gravida  2   Para  2   Term  2   Preterm      AB      Living  2      SAB      IAB      Ectopic      Multiple      Live Births  2           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 04/27/2022   Chest discomfort 11/19/2019   Diabetes mellitus due to underlying  condition with unspecified complications (HCC) 01/01/2018   Hyperlipidemia    on meds   Hypertension    Obesity (BMI 30.0-34.9) 07/04/2021   Osteopenia 10/2016   T score -1.3 FRAX 3.5%/0.1%   Rash and other nonspecific skin eruption 06/28/2021   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    Past Surgical History:  Procedure Laterality Date   CATARACT EXTRACTION Left    CESAREAN SECTION     x2   CHOLECYSTECTOMY, LAPAROSCOPIC  2010   COLONOSCOPY  03/2020   KNEE ARTHROSCOPY W/ MENISCAL REPAIR Left 2011   PARTIAL HYSTERECTOMY  1988    Social History   Socioeconomic History   Marital status: Married    Spouse name: Not on file   Number of children:  Not on file   Years of education: Not on file   Highest education level: Associate degree: academic program  Occupational History   Not on file  Tobacco Use   Smoking status: Never    Passive exposure: Never   Smokeless tobacco: Never  Vaping Use   Vaping status: Never Used  Substance and Sexual Activity   Alcohol use: No   Drug use: No   Sexual activity: Yes    Partners: Male    Birth control/protection: Surgical    Comment: 1st intercourse- 23, partners - 2, married- 20 yrs, hysterectomy  Other Topics Concern   Not on file  Social History Narrative   Not on file   Social Drivers of Health   Financial Resource Strain: Patient Declined (01/17/2024)   Overall Financial Resource Strain (CARDIA)    Difficulty of Paying Living Expenses: Patient declined  Food Insecurity: Patient Declined (01/17/2024)   Hunger Vital Sign    Worried About Running Out of Food in the Last Year: Patient declined    Ran Out of Food in the Last Year: Patient declined  Transportation Needs: Patient Declined (01/17/2024)   PRAPARE - Administrator, Civil Service (Medical): Patient declined    Lack of Transportation (Non-Medical): Patient declined  Physical Activity: Sufficiently Active (01/17/2024)   Exercise Vital Sign     Days of Exercise per Week: 4 days    Minutes of Exercise per Session: 70 min  Stress: Patient Declined (01/17/2024)   [employer] of Occupational Health - Occupational Stress Questionnaire    Feeling of Stress: Patient declined  Social Connections: Moderately Isolated (01/17/2024)   Social Connection and Isolation Panel    Frequency of Communication with Friends and Family: More than three times a week    Frequency of Social Gatherings with Friends and Family: Patient declined    Attends Religious Services: Patient declined    Database Administrator or Organizations: No    Attends Engineer, Structural: Not on file    Marital Status: Married    Family History  Problem Relation Age of Onset   Heart attack Maternal Grandmother    Colon polyps Neg Hx    Colon cancer Neg Hx    Esophageal cancer Neg Hx    Stomach cancer Neg Hx    Rectal cancer Neg Hx     Current Outpatient Medications on File Prior to Visit  Medication Sig Dispense Refill   ACCU-CHEK GUIDE test strip USE TO TEST BLOOD GLUCOSE FOUR TIMES DAILY AS DIRECTED 200 strip 12   atorvastatin  (LIPITOR) 10 MG tablet Take 1 tablet (10 mg total) by mouth daily. 90 tablet 3   Barberry-Oreg Grape-Goldenseal (BERBERINE COMPLEX PO) Take by mouth.     Cholecalciferol (VITAMIN D3) 125 MCG (5000 UT) CAPS Take 5,000 Units by mouth daily.     diclofenac  (VOLTAREN ) 75 MG EC tablet Take 1 tablet (75 mg total) by mouth 2 (two) times daily. 20 tablet 0   fenofibrate  (TRICOR ) 48 MG tablet TAKE 1 TABLET(48 MG) BY MOUTH DAILY 90 tablet 3   fluticasone  (FLONASE ) 50 MCG/ACT nasal spray Place 2 sprays into both nostrils daily. 48 g 1   JANUVIA  25 MG tablet TAKE 1 TABLET(25 MG) BY MOUTH DAILY 30 tablet 3   losartan  (COZAAR ) 50 MG tablet TAKE 1 TABLET(50 MG) BY MOUTH DAILY 90 tablet 0   MAGNESIUM PO Take 1 tablet by mouth daily.     metFORMIN  (GLUCOPHAGE ) 500 MG tablet Take 2  tablets (1,000 mg total) by mouth in the morning AND 1  tablet (500 mg total) every evening. 270 tablet 1   mometasone (ELOCON) 0.1 % ointment Apply topically daily. Apply 2 times daily for 2 weeks mixed with CeraVe anti itch lotion 45 g 3   sitaGLIPtin  (JANUVIA ) 25 MG tablet 1 tab po q day 90 tablet 3   hydrOXYzine  (ATARAX ) 10 MG tablet Take 1 tablet (10 mg total) by mouth 3 (three) times daily as needed. (Patient not taking: Reported on 02/04/2024) 30 tablet 0   methylPREDNISolone  (MEDROL ) 4 MG tablet 3 tab po day 1, 2 tab po day 2 and 1 tab po day 3 (Patient not taking: Reported on 02/04/2024) 6 tablet 0   nemolizumab-ilto (NEMLUVIO) 30 MG SQ injection Inject 30 mg into the skin as directed. Inject 30 mg into the skin as needed. Inject 60 mg subcutaneous at week 0 (Patient not taking: Reported on 02/04/2024) 2 each 0   nemolizumab-ilto (NEMLUVIO) 30 MG SQ injection Inject 30 mg into the skin as directed. Maintenance: Inject 30 mg into the skin every 30 (thirty) days. (Patient not taking: Reported on 02/04/2024) 1 each 11   pregabalin  (LYRICA ) 75 MG capsule Take 1 capsule (75 mg total) by mouth 2 (two) times daily. (Patient not taking: Reported on 02/04/2024) 60 capsule 1   No current facility-administered medications on file prior to visit.    No Known Allergies   Objective:   Vitals:   02/04/24 0900  BP: (!) 127/55  Pulse: (!) 101  Weight: 180 lb (81.6 kg)  Height: 5' 2 (1.575 m)   Physical Examination:   General appearance - well appearing, and in no distress  Mental status - alert, oriented to person, place, and time  Psych:  normal mood and affect  Skin - warm and dry, normal color, no suspicious lesions noted  Breasts - breasts appear normal, no suspicious masses, no skin or nipple changes or axillary nodes  Abdomen - soft, nontender, nondistended, no masses or organomegaly  Pelvic -  VULVA: normal appearing vulva with no masses, tenderness or lesions , atrophic VAGINA: normal appearing vagina with normal color and discharge, no  lesions   CERVIX: surgically absent  UTERUS: surgically absent  ADNEXA: No adnexal masses or tenderness noted.  Extremities:  No swelling or varicosities noted  Chaperone present for exam  Assessment and Plan:  1. Encounter for annual routine gynecological examination (Primary) No pap indicated Mammo, colonoscopy, DEXA up to date  Normal clinical exam  Return in about 1 year (around 02/03/2025).  Future Appointments  Date Time Provider Department Center  07/23/2024  9:45 AM Alm Delon SAILOR, DO [facility] None    Rollo ONEIDA Bring, MD, FACOG Obstetrician & Gynecologist, [facility], [facility]

## 2024-02-25 ENCOUNTER — Ambulatory Visit: Admitting: Medical

## 2024-02-25 VITALS — BP 130/70 | HR 93 | Temp 98.0°F | Resp 15 | Ht 62.0 in | Wt 182.6 lb

## 2024-02-25 DIAGNOSIS — R0789 Other chest pain: Secondary | ICD-10-CM

## 2024-02-25 DIAGNOSIS — I10 Essential (primary) hypertension: Secondary | ICD-10-CM

## 2024-02-25 DIAGNOSIS — S29011A Strain of muscle and tendon of front wall of thorax, initial encounter: Secondary | ICD-10-CM

## 2024-02-25 DIAGNOSIS — G44209 Tension-type headache, unspecified, not intractable: Secondary | ICD-10-CM

## 2024-02-25 DIAGNOSIS — N644 Mastodynia: Secondary | ICD-10-CM

## 2024-02-25 MED ORDER — DICLOFENAC SODIUM 75 MG PO TBEC: 75.0000 mg | DELAYED_RELEASE_TABLET | Freq: Two times a day (BID) | ORAL | 0 refills | Status: DC

## 2024-02-25 MED ORDER — CYCLOBENZAPRINE HCL 5 MG PO TABS: ORAL_TABLET | ORAL | 0 refills | Status: DC

## 2024-02-25 NOTE — Patient Instructions (Signed)
 Left pectoralis muscle pain Intermittent sharp left-sided pectoralis muscle pain likely musculoskeletal. EKG shows sinus rhythm with right bundle branch block, no acute ischemia(no change from  prior ekg May 2024). Recent mammogram and breast exam normal. - Ordered EKG to compare with previous results. - Prescribed diclofenac  75 mg twice daily for 7-10 days. - Advised to monitor for skin changes, redness, or warmth in the breast area. - Instructed to seek emergency evaluation if chest pain becomes constant with cardiac-like symptoms.  Tension-type headache with trapezius muscle pain Recent tension-type headache resolved, trapezius muscle tenderness persists. Likely stress-related. - Prescribed diclofenac  75 mg twice daily for 7-10 days. - Advised to monitor for recurrence of headache and associated symptoms. -also can add on flexeril  5 mg low dose to use for recurrent tension headache like features.(but use only at night)  Essential hypertension Blood pressure well-controlled, consistently less than 130 mmHg. - Continue current antihypertensive regimen.  Follow up 7-10 days or sooner if needed

## 2024-02-25 NOTE — Progress Notes (Signed)
 Subjective:    Patient ID: Janet Peters, female    DOB: [birth year], 73 y.o.   MRN: [id number]  HPI  Janet Peters is a 73 year old female who presents with transient left-sided breast/chest area pain and recent tension headaches.  For less than three weeks she has had transient sharp pain in the left side breast/chest area lasting one to two seconds, occurring daily without clear triggers and more pronounced with touch(this occurs sporadically and can be at rest or with movement). She notes no breast lumps, discharge from nipple, or visible changes. A mammogram in April was negative, and a breast exam by her gynecologist about a month ago was normal. She is not taking medications for this pain. Pain not currently on exam.  For the past two weeks she has had probable tension-type headaches, rated 3/10 constant for about 2 weeks, Now absent for the past two days. She describes them as bothersome with associated neck muscle pain. Tylenol  has not provided relief. In past prior response to flexeril . With ha's no associated neurologic signs/deficits      Review of Systems  Constitutional:  Negative for chills and fatigue.  HENT:  Negative for congestion and ear discharge.   Respiratory:  Negative for chest tightness and wheezing.   Cardiovascular:  Negative for chest pain and palpitations.  Gastrointestinal:  Negative for abdominal pain.  Musculoskeletal:  Negative for neck pain.       Trapezius pain. See hpi.   Left side chest wall/breast area pain  Neurological:  Negative for dizziness, weakness, light-headedness and headaches.       See hpi.  Psychiatric/Behavioral:  Negative for behavioral problems and decreased concentration.    Past Medical History:  Diagnosis Date   Age-related nuclear cataract of left eye 04/24/2017   Allergic rhinitis due to pollen 02/23/2021   Allergy    seasonal allergies   Aortic calcification 07/04/2021   Arthritis    bilateral knees    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   Bradycardia    per pt report   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 04/27/2022   Chest discomfort 11/19/2019   Diabetes mellitus due to underlying condition with unspecified complications (HCC) 01/01/2018   Hyperlipidemia    on meds   Hypertension    Obesity (BMI 30.0-34.9) 07/04/2021   Osteopenia 10/2016   T score -1.3 FRAX 3.5%/0.1%   Rash and other nonspecific skin eruption 06/28/2021   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     Social History   Socioeconomic History   Marital status: Married    Spouse name: Not on file   Number of children: Not on file   Years of education: Not on file   Highest education level: Associate degree: academic program  Occupational History   Not on file  Tobacco Use   Smoking status: Never    Passive exposure: Never   Smokeless tobacco: Never  Vaping Use   Vaping status: Never Used  Substance and Sexual Activity   Alcohol use: No   Drug use: No   Sexual activity: Yes    Partners: Male    Birth control/protection: Surgical    Comment: 1st intercourse- 23, partners - 2, married- 20 yrs, hysterectomy  Other Topics Concern   Not on file  Social History Narrative   Not on file   Social Drivers of Health   Financial Resource Strain: Patient Declined (01/17/2024)   Overall Financial Resource Strain (CARDIA)  Difficulty of Paying Living Expenses: Patient declined  Food Insecurity: Patient Declined (01/17/2024)   Hunger Vital Sign    Worried About Running Out of Food in the Last Year: Patient declined    Ran Out of Food in the Last Year: Patient declined  Transportation Needs: Patient Declined (01/17/2024)   PRAPARE - Administrator, Civil Service (Medical): Patient declined    Lack of Transportation (Non-Medical): Patient declined  Physical Activity: Sufficiently Active (01/17/2024)   Exercise Vital Sign    Days of Exercise per Week: 4  days    Minutes of Exercise per Session: 70 min  Stress: Patient Declined (01/17/2024)   [employer] of Occupational Health - Occupational Stress Questionnaire    Feeling of Stress: Patient declined  Social Connections: Moderately Isolated (01/17/2024)   Social Connection and Isolation Panel    Frequency of Communication with Friends and Family: More than three times a week    Frequency of Social Gatherings with Friends and Family: Patient declined    Attends Religious Services: Patient declined    Database Administrator or Organizations: No    Attends Engineer, Structural: Not on file    Marital Status: Married  Catering Manager Violence: Not on file    Past Surgical History:  Procedure Laterality Date   CATARACT EXTRACTION Left    CESAREAN SECTION     x2   CHOLECYSTECTOMY, LAPAROSCOPIC  2010   COLONOSCOPY  03/2020   KNEE ARTHROSCOPY W/ MENISCAL REPAIR Left 2011   PARTIAL HYSTERECTOMY  1988    Family History  Problem Relation Age of Onset   Heart attack Maternal Grandmother    Colon polyps Neg Hx    Colon cancer Neg Hx    Esophageal cancer Neg Hx    Stomach cancer Neg Hx    Rectal cancer Neg Hx     No Known Allergies  Current Outpatient Medications on File Prior to Visit  Medication Sig Dispense Refill   ACCU-CHEK GUIDE test strip USE TO TEST BLOOD GLUCOSE FOUR TIMES DAILY AS DIRECTED 200 strip 12   atorvastatin  (LIPITOR) 10 MG tablet Take 1 tablet (10 mg total) by mouth daily. 90 tablet 3   Barberry-Oreg Grape-Goldenseal (BERBERINE COMPLEX PO) Take by mouth.     Cholecalciferol (VITAMIN D3) 125 MCG (5000 UT) CAPS Take 5,000 Units by mouth daily.     diclofenac  (VOLTAREN ) 75 MG EC tablet Take 1 tablet (75 mg total) by mouth 2 (two) times daily. 20 tablet 0   fenofibrate  (TRICOR ) 48 MG tablet TAKE 1 TABLET(48 MG) BY MOUTH DAILY 90 tablet 3   fluticasone  (FLONASE ) 50 MCG/ACT nasal spray Place 2 sprays into both nostrils daily. 48 g 1   JANUVIA  25 MG  tablet TAKE 1 TABLET(25 MG) BY MOUTH DAILY 30 tablet 3   losartan  (COZAAR ) 50 MG tablet TAKE 1 TABLET(50 MG) BY MOUTH DAILY 90 tablet 0   MAGNESIUM PO Take 1 tablet by mouth daily.     metFORMIN  (GLUCOPHAGE ) 500 MG tablet Take 2 tablets (1,000 mg total) by mouth in the morning AND 1 tablet (500 mg total) every evening. 270 tablet 1   mometasone  (ELOCON ) 0.1 % ointment Apply topically daily. Apply 2 times daily for 2 weeks mixed with CeraVe anti itch lotion 45 g 3   sitaGLIPtin  (JANUVIA ) 25 MG tablet 1 tab po q day 90 tablet 3   hydrOXYzine  (ATARAX ) 10 MG tablet Take 1 tablet (10 mg total) by mouth 3 (three) times daily  as needed. (Patient not taking: Reported on 02/25/2024) 30 tablet 0   nemolizumab-ilto  (NEMLUVIO ) 30 MG SQ injection Inject 30 mg into the skin as directed. Inject 30 mg into the skin as needed. Inject 60 mg subcutaneous at week 0 (Patient not taking: Reported on 02/25/2024) 2 each 0   No current facility-administered medications on file prior to visit.    BP 130/70   Pulse 93   Temp 98 F (36.7 C) (Oral)   Resp 15   Ht 5' 2 (1.575 m)   Wt 182 lb 9.6 oz (82.8 kg)   SpO2 96%   BMI 33.40 kg/m        Objective:   Physical Exam  General- No acute distress. Pleasant patient. Neck- Full range of motion, no jvd trapezius mild tender to palpation. Lungs- Clear, even and unlabored. Heart- regular rate and rhythm. Neurologic- CNII- XII grossly intact.  Left side chest- lateral area of breast not tender when pt palpates today but points to that area. Upper pectoralis area not tender on rom today.       Assessment & Plan:   Patient Instructions  Left pectoralis muscle pain Intermittent sharp left-sided pectoralis muscle pain likely musculoskeletal. EKG shows sinus rhythm with right bundle branch block, no acute ischemia(no change from  prior ekg May 2024). Recent mammogram and breast exam normal. - Ordered EKG to compare with previous results. - Prescribed diclofenac  75  mg twice daily for 7-10 days. - Advised to monitor for skin changes, redness, or warmth in the breast area. - Instructed to seek emergency evaluation if chest pain becomes constant with cardiac-like symptoms.  Tension-type headache with trapezius muscle pain Recent tension-type headache resolved, trapezius muscle tenderness persists. Likely stress-related. - Prescribed diclofenac  75 mg twice daily for 7-10 days. - Advised to monitor for recurrence of headache and associated symptoms. -also can add on flexeril  5 mg low dose to use for recurrent tension headache like features.(but use only at night)  Essential hypertension Blood pressure well-controlled, consistently less than 130 mmHg. - Continue current antihypertensive regimen.  Follow up 7-10 days or sooner if needed

## 2024-04-09 ENCOUNTER — Telehealth: Payer: Self-pay | Admitting: Cardiology

## 2024-04-09 NOTE — Telephone Encounter (Signed)
 Patient would like to switch to the Dr that her husband see. Please advise

## 2024-04-10 ENCOUNTER — Other Ambulatory Visit: Payer: Self-pay | Admitting: Medical

## 2024-04-13 ENCOUNTER — Other Ambulatory Visit: Payer: Self-pay | Admitting: Medical

## 2024-04-14 ENCOUNTER — Ambulatory Visit: Admitting: Dermatology

## 2024-04-14 ENCOUNTER — Encounter: Payer: Self-pay | Admitting: Dermatology

## 2024-04-14 ENCOUNTER — Encounter: Payer: Self-pay | Admitting: Medical

## 2024-04-14 DIAGNOSIS — L209 Atopic dermatitis, unspecified: Secondary | ICD-10-CM | POA: Diagnosis not present

## 2024-04-14 DIAGNOSIS — L299 Pruritus, unspecified: Secondary | ICD-10-CM | POA: Diagnosis not present

## 2024-04-14 MED ORDER — NEMOLIZUMAB-ILTO 30 MG ~~LOC~~ AUIJ
60.0000 mg | AUTO-INJECTOR | Freq: Once | SUBCUTANEOUS | Status: AC
  Administered 2024-04-14: 60 mg via SUBCUTANEOUS

## 2024-04-14 NOTE — Progress Notes (Signed)
" ° °  Follow-Up Visit   Subjective  Janet Peters is a 74 y.o. female accompanied by husband and interpreter [name] and Marta) who presents for the following: Nemluvio  Injection Teaching for eczema  Patient present today for follow up visit for Nemluvio  Injection Teaching. Patient was last evaluated on 01/23/2024. At this visit patient was prescribed Mometasone  Cream. Today Patient rates her itch 10 out of 10. Patient reports sxs are unchanged. Patient denies medication changes.  Patient provided verbal consent for the use of an AI-assisted program to generate a detailed after-visit summary. The patient understands that the AI tool is used to support clinical documentation and that all information will be reviewed and verified by the healthcare provider.  The following portions of the chart were reviewed this encounter and updated as appropriate: medications, allergies, medical history  Review of Systems:  No other skin or systemic complaints except as noted in HPI or Assessment and Plan.  Objective  Well appearing patient in no apparent distress; mood and affect are within normal limits.  A full examination was performed including scalp, head, eyes, ears, nose, lips, neck, chest, axillae, abdomen, back, buttocks, bilateral upper extremities, bilateral lower extremities, hands, feet, fingers, toes, fingernails, and toenails. All findings within normal limits unless otherwise noted below.   Relevant exam findings are noted in the Assessment and Plan.    Assessment & Plan    Atopic Dermatitis and Pruritus Chronic atopic dermatitis with persistent itching and rash. Previous topical treatments, including mometasone  and CeraVe anti-itch cream, were ineffective. Started Nemluvio  Injections today which are expected to reduce itching and allow skin clearance. Injections are effective while in use, but itching may return upon cessation. Plan to continue injections for at least one to two  years, with potential to extend intervals after one year if symptoms are controlled.  - Continue injections monthly. - Continue mometasone  cream two weeks on, two weeks off. - Continue CeraVe anti-itch cream. - Scheduled follow-up appointment in April.   Procedure Note Nemluvio  Injection  Location: B/L Lower Abdomen  Informed Consent: Discussed risks (infection, pain, bleeding, bruising, lack of resolution, and recurrence of lesion) and benefits of the procedure, as well as the alternatives. Informed consent was obtained. Preparation: The area was prepared a standard fashion with Isopropyl Alcohol.  Anesthesia: Ice Pack  Procedure Details: Patient instructed how to complete Nemluvio  Injections with Demonstration Pen. A(n) subcutaneous injection was performed with Nemluvio . 2 ml in total were injected.  Nemluvio  (2 Pens) (PATIENT SUPPLIED) NDC #: 9700-3779-84 Exp: 04/2026 LOT: [id number]  Total number of injections: 2  Plan: The patient was instructed on post-procedure care. Recommend OTC analgesia as needed for pain.  Treatment Plan: - Patient completed Injections while in office at B/L Lower Abdomen, Mild erythema noted at injection site(s).   - Advised to continue topicals for break through Flares - Patient tolerated well and demonstrated understanding - Recommend gentle skin care - Plan to follow up in 4 months   Return in about 4 months (around 08/12/2024) for Eczema F/U.  I, Jetta Ager, am acting as neurosurgeon for [employer], DO.  Documentation: I have reviewed the above documentation for accuracy and completeness, and I agree with the above.  Delon Lenis, DO   "

## 2024-04-14 NOTE — Patient Instructions (Signed)

## 2024-04-14 NOTE — Addendum Note (Signed)
 Addended by: Indigo Barbian J on: 04/14/2024 05:06 PM   Modules accepted: Orders

## 2024-04-16 ENCOUNTER — Other Ambulatory Visit (HOSPITAL_BASED_OUTPATIENT_CLINIC_OR_DEPARTMENT_OTHER): Payer: Self-pay | Admitting: Medical

## 2024-04-16 DIAGNOSIS — Z1231 Encounter for screening mammogram for malignant neoplasm of breast: Secondary | ICD-10-CM

## 2024-04-23 ENCOUNTER — Ambulatory Visit

## 2024-04-23 VITALS — BP 138/78 | HR 86 | Ht 62.0 in | Wt 180.2 lb

## 2024-04-23 DIAGNOSIS — I10 Essential (primary) hypertension: Secondary | ICD-10-CM | POA: Diagnosis not present

## 2024-04-23 DIAGNOSIS — R079 Chest pain, unspecified: Secondary | ICD-10-CM

## 2024-04-23 DIAGNOSIS — E782 Mixed hyperlipidemia: Secondary | ICD-10-CM

## 2024-04-23 MED ORDER — METOPROLOL TARTRATE 100 MG PO TABS: 100.0000 mg | ORAL_TABLET | Freq: Once | ORAL | 0 refills | Status: AC

## 2024-04-23 MED ORDER — ASPIRIN 81 MG PO TBEC: 81.0000 mg | DELAYED_RELEASE_TABLET | Freq: Every day | ORAL | 3 refills | Status: AC

## 2024-04-23 NOTE — Patient Instructions (Signed)
 Medication Instructions:  Your physician has recommended you make the following change in your medication:   START: Aspirin  81 mg daily  *If you need a refill on your cardiac medications before your next appointment, please call your pharmacy*  Lab Work: Your physician recommends that you return for lab work in:   Labs today: BMP  If you have labs (blood work) drawn today and your tests are completely normal, you will receive your results only by: MyChart Message (if you have MyChart) OR A paper copy in the mail If you have any lab test that is abnormal or we need to change your treatment, we will call you to review the results.  Testing/Procedures:   Your cardiac CT will be scheduled at one of the below locations:   [facility] [address] [place], [place] [address] [phone] (Severe contrast allergies only)  OR   [facility] [address] [place], [place] [address] [phone]  OR   [facility] [address] [place], [place] [address] [phone]  OR   [facility] [address]  [place], [place] [address]  OR   [facility] [address] [place], [place] [phone]  If scheduled at [facility], please arrive at the [facility] and Children's Entrance (Entrance C2) of [facility] 30 minutes prior to test start time. You can use the FREE valet parking offered at entrance C (encouraged to control the heart rate for the test)  Proceed to the [facility] Radiology Department (first floor) to check-in and test prep.  All radiology patients and guests should use entrance C2 at [facility], accessed from [facility], even though the hospital's physical address listed is [address]  If scheduled at the Heart and Vascular Tower at [address], please enter the parking lot using the [address] entrance and use the  FREE valet service at the patient drop-off area. Enter the building and check-in with registration on the main floor.  If scheduled at [facility], please arrive to the Heart and Vascular Center 15 mins early for check-in and test prep.  There is spacious parking and easy access to the radiology department from the [facility] Heart and Vascular entrance. Please enter here and check-in with the desk attendant.   If scheduled at [facility], please arrive 30 minutes early for check-in and test prep.  Please follow these instructions carefully (unless otherwise directed):  An IV will be required for this test and Nitroglycerin will be given.  Hold all erectile dysfunction medications at least 3 days (72 hrs) prior to test. (Ie viagra, cialis, sildenafil, tadalafil, etc)   On the Night Before the Test: Be sure to Drink plenty of water. Do not consume any caffeinated/decaffeinated beverages or chocolate 12 hours prior to your test. Do not take any antihistamines 12 hours prior to your test.  On the Day of the Test: Drink plenty of water until 1 hour prior to the test. Do not eat any food 1 hour prior to test. You may take your regular medications prior to the test.  Take metoprolol  (Lopressor ) two hours prior to test. Patients who wear a continuous glucose monitor MUST remove the device prior to scanning. FEMALES- please wear underwire-free bra if available, avoid dresses & tight clothing       After the Test: Drink plenty of water. After receiving IV contrast, you  may experience a mild flushed feeling. This is normal. On occasion, you may experience a mild rash up to 24 hours after the test. This is not dangerous. If this occurs, you can take Benadryl 25 mg, Zyrtec, Claritin, or Allegra and increase your fluid intake. (Patients taking Tikosyn should avoid Benadryl, and may take Zyrtec, Claritin, or Allegra) If you experience trouble breathing, this can be serious.  If it is severe call 911 IMMEDIATELY. If it is mild, please call our office.  We will call to schedule your test 2-4 weeks out understanding that some insurance companies will need an authorization prior to the service being performed.   For more information and frequently asked questions, please visit our website : [email or web address]  For non-scheduling related questions, please contact the cardiac imaging nurse navigator should you have any questions/concerns: Cardiac Imaging Nurse Navigators Direct Office Dial: [phone]   For scheduling needs, including cancellations and rescheduling, please call Brittany, [phone].  For billing questions, please call [phone].    Follow-Up: At [facility], you and your health needs are our priority.  As part of our continuing mission to provide you with exceptional heart care, our providers are all part of one team.  This team includes your primary Cardiologist (physician) and Advanced Practice Providers or APPs (Physician Assistants and Nurse Practitioners) who all work together to provide you with the care you need, when you need it.  Your next appointment:   2 month(s)  Provider:   Alean Kobus, MD    We recommend signing up for the patient portal called MyChart.  Sign up information is provided on this After Visit Summary.  MyChart is used to connect with patients for Virtual Visits (Telemedicine).  Patients are able to view lab/test results, encounter notes, upcoming appointments, etc.  Non-urgent messages can be sent to your provider as well.   To learn more about what you can do with MyChart, go to [email or web address].   Other Instructions None

## 2024-04-23 NOTE — Progress Notes (Signed)
 "  Cardiology Consultation:    Date:  04/23/2024   ID:  [name], DOB 04-Aug-1950, MRN [id number]  PCP:  Nikki Rams, Aliene, MD  Cardiologist:  Alean SAUNDERS Shiva Karis, MD   Referring MD: Dorina Dallas RIGGERS   No chief complaint on file.    ASSESSMENT AND PLAN:   [name] 74 year old woman with history of hypertension, hyperlipidemia, diabetes, aortic atherosclerosis on calcium  score study from June 2022 [calcium  score 0], Lexiscan  stress test July 2019 without ischemia, RBBB, no significant disease on ultrasound carotids July 2024, recent transthoracic echocardiogram June 2022 with normal biventricular function LVEF 60 to 65% mild LVH and grade 1 diastolic dysfunction. No prior history of MI, CVA, CHF. Does not smoke or drink alcohol.  Here for further evaluation of chest pressure and shortness of breath on exertion.  Problem List Items Addressed This Visit       Cardiovascular and Mediastinum   Hypertension   Well-controlled on current treatment with losartan  50 mg once daily. Target blood pressure below 130/80 mmHg. Consider titrating up losartan  if blood pressures at home consistently above 130/80 mmHg.       Relevant Medications   metoprolol  tartrate (LOPRESSOR ) 100 MG tablet   aspirin  EC 81 MG tablet   Other Relevant Orders   Basic Metabolic Panel (BMET)     Other   Chest pain on exertion - Primary   Chest pain on exertion on and off for couple months.  Appears to be associated with moderate activity.  Does have cardiovascular risk factors.  Will recommend further evaluation with cardiac CT coronary angiogram. Use of IV contrast, radiation exposure, medications such as metoprolol  for rate optimization reviewed. Agreeable to proceed. Prefers to have this done at [facility] in [facility].  Recommended she start taking aspirin  81 mg once daily until evaluation with cardiac CT is completed and reviewed.  If symptoms worsen  such as occurring at less amount of exertion or at rest, to notify us  promptly or call 911 and get to the ER.       Relevant Orders   EKG 12-Lead (Completed)   CT CORONARY MORPH W/CTA COR W/SCORE W/CA W/CM &/OR WO/CM   Basic Metabolic Panel (BMET)   Hyperlipidemia   Continue atorvastatin  10 mg once daily with recent repeat panel 01/18/2024 total cholesterol 139, triglycerides 107, HDL 55 and LDL 62.  Optimal.       Relevant Medications   metoprolol  tartrate (LOPRESSOR ) 100 MG tablet   aspirin  EC 81 MG tablet   Other Relevant Orders   Basic Metabolic Panel (BMET)  Return to clinic tentatively in 2 months.    History of Present Illness:    Janet Peters is a 74 y.o. female who is being seen today for follow-up visit. PCP is Saguier, [place], PA-C. Last visit with office was 08/28/2023 with Delon Pay, NP-C.  Prior to that followed up with Dr. Monetta.  Seeing me today for the first time.  Spanish-speaking woman, seen today with help of Spanish interpreter in person.  Here for the visit accompanied by herself.  I also follow her husband at the office.  Accompanies her husband 4 times a week to the gym and works out for about an hour on treadmill [3% incline and 2.6 to 3 mph pace] and mild strength training.  Has history of hypertension, hyperlipidemia, diabetes, aortic atherosclerosis on calcium  score study from June 2022 [calcium  score 0], Lexiscan  stress test July 2019 without ischemia,  RBBB, no significant disease on ultrasound carotids July 2024, recent transthoracic echocardiogram June 2022 with normal biventricular function LVEF 60 to 65% mild LVH and grade 1 diastolic dysfunction. No prior history of MI, CVA, CHF. Does not smoke or drink alcohol.  Recent blood work is from 01/18/2024 with lipid panel total cholesterol 139, triglycerides 107, HDL 55 and LDL 62.  Optimal. Complete metabolic panel with BUN 21, creatinine 0.81 and eGFR Normal transaminases and alkaline  phosphatase Sodium 141 and potassium 4.6. Hemoglobin A1c 6.9  EKG in the clinic today shows sinus rhythm heart rate 88/min, PR interval normal 140 ms, QRS duration 132 ms consistent with RBBB morphology, no ischemic changes.  Over the past couple months she has been dealing with symptoms of chest pain and pressure radiating to the back, occurring during moderate exertion.  1 significant episode was while walking dragging her carry-on [place] her in the airport.  Symptoms resolved within 3 to 4 minutes. Has had similar symptoms at other times with exertion.  However remains relatively asymptomatic during her exercise activities at the gym. No symptoms at rest.  No palpitations, lightheadedness, dizziness or syncopal episodes. No blood in urine or stools.  Good compliance with her medications. No recent changes in medications.   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   Social History   Socioeconomic History   Marital status: Married    Spouse name: Not on file   Number of children: Not on file   Years of education: Not on file   Highest education level: Associate degree: academic program  Occupational History   Not on file  Tobacco Use   Smoking status: Never    Passive exposure: Never   Smokeless tobacco: Never  Vaping Use   Vaping status: Never Used  Substance and Sexual Activity   Alcohol use: No   Drug use: No   Sexual activity: Yes    Partners: Male    Birth control/protection: Surgical    Comment: 1st intercourse- 23, partners - 2, married- 20 yrs, hysterectomy  Other Topics Concern   Not on file  Social History Narrative   Not on file   Social Drivers of Health   Tobacco Use: Low Risk (04/23/2024)   Patient History    Smoking Tobacco Use: Never    Smokeless Tobacco Use: Never  Passive Exposure: Never  Financial Resource Strain: Patient Declined (01/17/2024)   Overall Financial Resource Strain (CARDIA)    Difficulty of Paying Living Expenses: Patient declined  Food Insecurity: Patient Declined (01/17/2024)   Epic    Worried About Programme Researcher, Broadcasting/film/video in the Last Year: Patient declined    Barista in the Last Year: Patient declined  Transportation Needs: Patient Declined (01/17/2024)   Epic    Lack of Transportation (Medical): Patient declined    Lack of Transportation (Non-Medical): Patient declined  Physical Activity: Sufficiently Active (01/17/2024)   Exercise Vital Sign    Days of Exercise per Week: 4 days    Minutes of Exercise per Session: 70 min  Stress: Patient Declined (01/17/2024)   [employer] of Occupational Health - Occupational Stress Questionnaire    Feeling of Stress: Patient declined  Social Connections: Moderately Isolated (01/17/2024)   Social Connection and Isolation Panel    Frequency of Communication with Friends and Family: More than three times a  week    Frequency of Social Gatherings with Friends and Family: Patient declined    Attends Religious Services: Patient declined    Active Member of Clubs or Organizations: No    Attends Engineer, Structural: Not on file    Marital Status: Married  Depression (PHQ2-9): Medium Risk (02/25/2024)   Depression (PHQ2-9)    PHQ-2 Score: 7  Alcohol Screen: Low Risk (09/05/2022)   Alcohol Screen    Last Alcohol Screening Score (AUDIT): 1  Housing: Unknown (01/17/2024)   Epic    Unable to Pay for Housing in the Last Year: Patient declined    Number of Times Moved in the Last Year: Not on file    Homeless in the Last Year: No  Utilities: Not on file  Health Literacy: Not on file     Family History: The patient's family history includes Heart attack in her maternal grandmother. There is no history of Colon polyps, Colon cancer, Esophageal cancer, Stomach cancer, or Rectal cancer. ROS:   Please see the history of present illness.    All 14 point review of systems negative except as described per history of present illness.  EKGs/Labs/Other Studies Reviewed:    The following studies were reviewed today:   EKG:  EKG Interpretation Date/Time:  Wednesday April 23 2024 08:25:20 EST Ventricular Rate:  88 PR Interval:  140 QRS Duration:  132 QT Interval:  408 QTC Calculation: 493 R Axis:   61  Text Interpretation: Normal sinus rhythm Right bundle branch block When compared with ECG of 28-Aug-2023 07:55, No significant change was found Confirmed by Liborio Hai reddy [phone]) on 04/23/2024 8:29:22 AM    Recent Labs: 01/18/2024: ALT 17; BUN 21; Creat 0.81; Potassium 4.6; Sodium 141  Recent Lipid Panel    Component Value Date/Time   CHOL 139 01/18/2024 0830   CHOL 174 05/28/2019 0926   TRIG 107.0 01/18/2024 0830   HDL 55.80 01/18/2024 0830   HDL 53 05/28/2019 0926   CHOLHDL 2 01/18/2024 0830   VLDL 21.4 01/18/2024 0830   LDLCALC 62 01/18/2024 0830   LDLCALC 84  05/28/2019 0926   LDLDIRECT 92.0 05/25/2022 0919    Physical Exam:    VS:  BP 138/78   Pulse 86   Ht 5' 2 (1.575 m)   Wt 180 lb 3.2 oz (81.7 kg)   SpO2 99%   BMI 32.96 kg/m     Wt Readings from Last 3 Encounters:  04/23/24 180 lb  3.2 oz (81.7 kg)  02/25/24 182 lb 9.6 oz (82.8 kg)  02/04/24 180 lb (81.6 kg)     GENERAL:  Well nourished, well developed in no acute distress NECK: No JVD; No carotid bruits CARDIAC: RRR, S1 and S2 present, no murmurs, no rubs, no gallops CHEST:  Clear to auscultation without rales, wheezing or rhonchi  Extremities: No pitting pedal edema. Pulses bilaterally symmetric with radial 2+ and dorsalis pedis 2+ NEUROLOGIC:  Alert and oriented x 3  Medication Adjustments/Labs and Tests Ordered: Current medicines are reviewed at length with the patient today.  Concerns regarding medicines are outlined above.  Orders Placed This Encounter  Procedures   CT CORONARY MORPH W/CTA COR W/SCORE W/CA W/CM &/OR WO/CM   Basic Metabolic Panel (BMET)   EKG 12-Lead   Meds ordered this encounter  Medications   metoprolol  tartrate (LOPRESSOR ) 100 MG tablet    Sig: Take 1 tablet (100 mg total) by mouth once. Please take this medication 2 hours before CT.    Dispense:  1 tablet    Refill:  0   aspirin  EC 81 MG tablet    Sig: Take 1 tablet (81 mg total) by mouth daily. Swallow whole.    Dispense:  90 tablet    Refill:  3    Signed, Welden Hausmann reddy Shuayb Schepers, MD, MPH, [facility]. 04/23/2024 9:19 AM    [facility]     [1]  Current Meds  Medication Sig   ACCU-CHEK GUIDE test strip USE TO TEST BLOOD GLUCOSE FOUR TIMES DAILY AS DIRECTED   aspirin  EC 81 MG tablet Take 1 tablet (81 mg total) by mouth daily. Swallow whole.   atorvastatin  (LIPITOR) 10 MG tablet Take 1 tablet (10 mg total) by mouth daily.   Barberry-Oreg Grape-Goldenseal (BERBERINE COMPLEX PO) Take by mouth.   Cholecalciferol (VITAMIN D3) 125 MCG (5000 UT) CAPS Take 5,000 Units by  mouth daily.   clobetasol  cream (TEMOVATE ) 0.05 % 1 application Externally Twice a day   fenofibrate  (TRICOR ) 48 MG tablet TAKE 1 TABLET(48 MG) BY MOUTH DAILY   fluticasone  (FLONASE ) 50 MCG/ACT nasal spray Place 2 sprays into both nostrils daily.   levocetirizine (XYZAL ) 5 MG tablet 1 tablet in the evening Orally Once a day; Duration: 90 days   losartan  (COZAAR ) 50 MG tablet TAKE 1 TABLET(50 MG) BY MOUTH DAILY   metFORMIN  (GLUCOPHAGE ) 500 MG tablet Take 2 tablets (1,000 mg total) by mouth in the morning AND 1 tablet (500 mg total) every evening.   metoprolol  tartrate (LOPRESSOR ) 100 MG tablet Take 1 tablet (100 mg total) by mouth once. Please take this medication 2 hours before CT.   mometasone  (ELOCON ) 0.1 % ointment Apply topically daily. Apply 2 times daily for 2 weeks mixed with CeraVe anti itch lotion   nemolizumab-ilto  (NEMLUVIO ) 30 MG SQ injection Inject 30 mg into the skin as directed. Inject 30 mg into the skin as needed. Inject 60 mg subcutaneous at week 0   sitaGLIPtin  (JANUVIA ) 25 MG tablet 1 tab po q day   "

## 2024-04-23 NOTE — Assessment & Plan Note (Signed)
 Chest pain on exertion on and off for couple months.  Appears to be associated with moderate activity.  Does have cardiovascular risk factors.  Will recommend further evaluation with cardiac CT coronary angiogram. Use of IV contrast, radiation exposure, medications such as metoprolol  for rate optimization reviewed. Agreeable to proceed. Prefers to have this done at [facility] in [facility].  Recommended she start taking aspirin  81 mg once daily until evaluation with cardiac CT is completed and reviewed.  If symptoms worsen such as occurring at less amount of exertion or at rest, to notify us  promptly or call 911 and get to the ER.

## 2024-04-23 NOTE — Assessment & Plan Note (Signed)
 Well-controlled on current treatment with losartan  50 mg once daily. Target blood pressure below 130/80 mmHg. Consider titrating up losartan  if blood pressures at home consistently above 130/80 mmHg.

## 2024-04-23 NOTE — Assessment & Plan Note (Signed)
 Continue atorvastatin  10 mg once daily with recent repeat panel 01/18/2024 total cholesterol 139, triglycerides 107, HDL 55 and LDL 62.  Optimal.

## 2024-04-24 LAB — BASIC METABOLIC PANEL WITH GFR
BUN/Creatinine Ratio: 23 (ref 12–28)
BUN: 15 mg/dL (ref 8–27)
CO2: 21 mmol/L (ref 20–29)
Calcium: 9.8 mg/dL (ref 8.7–10.3)
Chloride: 104 mmol/L (ref 96–106)
Creatinine, Ser: 0.66 mg/dL (ref 0.57–1.00)
Glucose: 118 mg/dL — ABNORMAL HIGH (ref 70–99)
Potassium: 4.5 mmol/L (ref 3.5–5.2)
Sodium: 142 mmol/L (ref 134–144)
eGFR: 93 mL/min/{1.73_m2}

## 2024-05-13 ENCOUNTER — Ambulatory Visit (HOSPITAL_BASED_OUTPATIENT_CLINIC_OR_DEPARTMENT_OTHER)

## 2024-06-03 ENCOUNTER — Ambulatory Visit: Admitting: Dermatology

## 2024-07-21 ENCOUNTER — Ambulatory Visit (HOSPITAL_BASED_OUTPATIENT_CLINIC_OR_DEPARTMENT_OTHER)

## 2024-07-23 ENCOUNTER — Ambulatory Visit: Admitting: Dermatology

## 2024-09-01 ENCOUNTER — Ambulatory Visit
